# Patient Record
Sex: Male | Born: 1940
Health system: Southern US, Community
[De-identification: ages and names within clinical notes are randomized; demographics above are authoritative.]

## PROBLEM LIST (undated history)

## (undated) DIAGNOSIS — T7840XA Allergy, unspecified, initial encounter: Secondary | ICD-10-CM

## (undated) DIAGNOSIS — M1990 Unspecified osteoarthritis, unspecified site: Secondary | ICD-10-CM

## (undated) DIAGNOSIS — E079 Disorder of thyroid, unspecified: Secondary | ICD-10-CM

## (undated) DIAGNOSIS — I2510 Atherosclerotic heart disease of native coronary artery without angina pectoris: Secondary | ICD-10-CM

## (undated) DIAGNOSIS — M109 Gout, unspecified: Secondary | ICD-10-CM

## (undated) DIAGNOSIS — K219 Gastro-esophageal reflux disease without esophagitis: Secondary | ICD-10-CM

## (undated) DIAGNOSIS — I10 Essential (primary) hypertension: Secondary | ICD-10-CM

## (undated) DIAGNOSIS — N200 Calculus of kidney: Secondary | ICD-10-CM

## (undated) DIAGNOSIS — Z860100 Personal history of colon polyps, unspecified: Secondary | ICD-10-CM

## (undated) DIAGNOSIS — E785 Hyperlipidemia, unspecified: Secondary | ICD-10-CM

## (undated) DIAGNOSIS — R12 Heartburn: Secondary | ICD-10-CM

## (undated) HISTORY — DX: Unspecified osteoarthritis, unspecified site: M19.90

## (undated) HISTORY — PX: BIOPSY PROSTATE: PRO28

## (undated) HISTORY — DX: Gastro-esophageal reflux disease without esophagitis: K21.9

## (undated) HISTORY — DX: Essential (primary) hypertension: I10

## (undated) HISTORY — DX: Hyperlipidemia, unspecified: E78.5

## (undated) HISTORY — DX: Personal history of colonic polyps: Z86.010

## (undated) HISTORY — DX: Personal history of colon polyps, unspecified: Z86.0100

## (undated) HISTORY — PX: OTHER SURGICAL HISTORY: SHX169

## (undated) HISTORY — DX: Allergy, unspecified, initial encounter: T78.40XA

## (undated) HISTORY — PX: UPPER GASTROINTESTINAL ENDOSCOPY: SHX188

## (undated) HISTORY — DX: Atherosclerotic heart disease of native coronary artery without angina pectoris: I25.10

## (undated) HISTORY — DX: Disorder of thyroid, unspecified: E07.9

---

## 1996-09-10 HISTORY — PX: CORONARY ARTERY BYPASS GRAFT: SHX141

## 2000-06-21 ENCOUNTER — Encounter: Payer: Self-pay | Admitting: *Deleted

## 2000-06-21 ENCOUNTER — Inpatient Hospital Stay (HOSPITAL_COMMUNITY): Admission: EM | Admit: 2000-06-21 | Discharge: 2000-06-22 | Payer: Self-pay | Admitting: Emergency Medicine

## 2000-07-05 ENCOUNTER — Inpatient Hospital Stay (HOSPITAL_COMMUNITY): Admission: EM | Admit: 2000-07-05 | Discharge: 2000-07-09 | Payer: Self-pay | Admitting: Emergency Medicine

## 2000-07-05 ENCOUNTER — Encounter: Payer: Self-pay | Admitting: Emergency Medicine

## 2000-08-14 ENCOUNTER — Encounter: Payer: Self-pay | Admitting: Cardiology

## 2000-08-14 ENCOUNTER — Inpatient Hospital Stay (HOSPITAL_COMMUNITY): Admission: EM | Admit: 2000-08-14 | Discharge: 2000-08-15 | Payer: Self-pay | Admitting: Emergency Medicine

## 2000-11-01 ENCOUNTER — Ambulatory Visit (HOSPITAL_COMMUNITY): Admission: RE | Admit: 2000-11-01 | Discharge: 2000-11-01 | Payer: Self-pay | Admitting: Gastroenterology

## 2000-11-01 ENCOUNTER — Encounter (INDEPENDENT_AMBULATORY_CARE_PROVIDER_SITE_OTHER): Payer: Self-pay | Admitting: Specialist

## 2007-09-11 HISTORY — PX: COLONOSCOPY W/ POLYPECTOMY: SHX1380

## 2007-09-17 ENCOUNTER — Ambulatory Visit: Payer: Self-pay | Admitting: Cardiology

## 2008-12-04 DIAGNOSIS — E785 Hyperlipidemia, unspecified: Secondary | ICD-10-CM

## 2008-12-04 DIAGNOSIS — I10 Essential (primary) hypertension: Secondary | ICD-10-CM | POA: Insufficient documentation

## 2008-12-04 DIAGNOSIS — I2510 Atherosclerotic heart disease of native coronary artery without angina pectoris: Secondary | ICD-10-CM

## 2008-12-04 DIAGNOSIS — I495 Sick sinus syndrome: Secondary | ICD-10-CM | POA: Insufficient documentation

## 2008-12-08 ENCOUNTER — Ambulatory Visit: Payer: Self-pay | Admitting: Cardiology

## 2009-11-17 ENCOUNTER — Ambulatory Visit: Payer: Self-pay | Admitting: Internal Medicine

## 2009-11-17 ENCOUNTER — Inpatient Hospital Stay (HOSPITAL_COMMUNITY): Admission: EM | Admit: 2009-11-17 | Discharge: 2009-11-22 | Payer: Self-pay | Admitting: Emergency Medicine

## 2009-12-12 ENCOUNTER — Encounter: Payer: Self-pay | Admitting: Cardiology

## 2009-12-14 ENCOUNTER — Ambulatory Visit: Payer: Self-pay | Admitting: Cardiology

## 2009-12-21 ENCOUNTER — Telehealth: Payer: Self-pay | Admitting: Cardiology

## 2009-12-21 ENCOUNTER — Ambulatory Visit: Payer: Self-pay

## 2009-12-21 ENCOUNTER — Ambulatory Visit: Payer: Self-pay | Admitting: Cardiology

## 2009-12-21 DIAGNOSIS — R002 Palpitations: Secondary | ICD-10-CM | POA: Insufficient documentation

## 2010-02-20 ENCOUNTER — Telehealth: Payer: Self-pay | Admitting: Cardiology

## 2010-02-21 ENCOUNTER — Encounter: Payer: Self-pay | Admitting: Cardiology

## 2010-02-21 ENCOUNTER — Encounter (INDEPENDENT_AMBULATORY_CARE_PROVIDER_SITE_OTHER): Payer: Self-pay | Admitting: *Deleted

## 2010-02-23 ENCOUNTER — Encounter: Payer: Self-pay | Admitting: Cardiology

## 2010-02-23 ENCOUNTER — Ambulatory Visit (HOSPITAL_COMMUNITY)
Admission: RE | Admit: 2010-02-23 | Discharge: 2010-02-23 | Payer: Self-pay | Source: Home / Self Care | Admitting: Urology

## 2010-04-18 ENCOUNTER — Encounter: Payer: Self-pay | Admitting: Cardiology

## 2010-04-19 ENCOUNTER — Ambulatory Visit: Payer: Self-pay | Admitting: Cardiology

## 2010-08-23 ENCOUNTER — Encounter: Payer: Self-pay | Admitting: Cardiology

## 2010-09-27 ENCOUNTER — Ambulatory Visit
Admission: RE | Admit: 2010-09-27 | Discharge: 2010-09-27 | Payer: Self-pay | Source: Home / Self Care | Attending: Cardiology | Admitting: Cardiology

## 2010-09-27 ENCOUNTER — Encounter: Payer: Self-pay | Admitting: Cardiology

## 2010-10-10 NOTE — Progress Notes (Signed)
Summary: clearance for proceduer  waiting on call back  pfh,rn   Phone Note From Other Clinic   Caller: nurse pam Summary of Call: Per Pam Dr Annabell Howells pt needs a kidney stone removed ASAP needs to know if pt is ok to hold plavix and have this done. ofc [id number] [phone] fax [phone] Initial call taken by: Edman Circle,  February 20, 2010 11:16 AM  Follow-up for Phone Call        Pt would be at risk for stent thrombosis if Plavix held secondary to DES in March.  Can procedure be done on Plavix? Follow-up by: Rollene Rotunda, MD, [facility],  February 20, 2010 5:44 PM  Additional Follow-up for Phone Call Additional follow up Details #1::        left message for Pam at Dr Belva Crome office that pt had a DES in March and holding Plavix puts him a risk for stent thrombosis.  Asked her to call back to discuss options.  Pam Fleming-Hayes,RN  Spoke with Pam at Dr Belva Crome office, procedure can be done without holding Plavix. Additional Follow-up by: Charolotte Capuchin, RN,  February 21, 2010 9:36 AM

## 2010-10-10 NOTE — Assessment & Plan Note (Signed)
Summary: [facility]  Medications Added ASPIRIN 325 MG  TABS (ASPIRIN) 1 by mouth daily * BLACK CHERRY 250MG  1 by mouth daily FISH OIL   OIL (FISH OIL) 2 po daily VITAMIN D3 1000 UNIT CAPS (CHOLECALCIFEROL) 1 by mouth daily SYNTHROID 50 MCG TABS (LEVOTHYROXINE SODIUM) 1 by mouth daily METOPROLOL SUCCINATE 50 MG XR24H-TAB (METOPROLOL SUCCINATE) 1 by mouth daily LOVASTATIN 40 MG TABS (LOVASTATIN) 1 by mouth daily AMLODIPINE BESYLATE 10 MG TABS (AMLODIPINE BESYLATE) 1 by mouth daily PLAVIX 75 MG TABS (CLOPIDOGREL BISULFATE) 1 by mouth daily LOSARTAN POTASSIUM 50 MG TABS (LOSARTAN POTASSIUM) 1 by mouth daily NITROSTAT 0.4 MG SUBL (NITROGLYCERIN) as needed      Allergies Added: ! SULFA   Visit Type:  Follow-up Primary Provider:  Dr. Vernon Prey  CC:  CAD.  History of Present Illness: The patient presents for followup after recent stenting. Since that hospitalization he has done well. He has had no other chest discomfort which prompted his admission. He's had no chest pressure, neck or arm discomfort. He's been walking the golf course and walking for exercise. He did have an episode of lightheadedness and dizziness for which he took a nitroglycerin. His blood pressure was elevated at that time. He's had no further episodes of this and says otherwise his blood pressure is well controlled. I reviewed his most recent lipids with an LDL of 73 and HDL of 27. This was done in the hospital.  Current Medications (verified): 1)  Aspirin 325 Mg  Tabs (Aspirin) .Marland Kitchen.. 1 By Mouth Daily 2)  Black Cherry 250mg  .... 1 By Mouth Daily 3)  Fish Oil   Oil (Fish Oil) .... 2 Po Daily 4)  Vitamin D3 1000 Unit Caps (Cholecalciferol) .Marland Kitchen.. 1 By Mouth Daily 5)  Synthroid 50 Mcg Tabs (Levothyroxine Sodium) .Marland Kitchen.. 1 By Mouth Daily 6)  Metoprolol Succinate 50 Mg Xr24h-Tab (Metoprolol Succinate) .Marland Kitchen.. 1 By Mouth Daily 7)  Lovastatin 40 Mg Tabs (Lovastatin) .Marland Kitchen.. 1 By Mouth Daily 8)  Amlodipine Besylate 10 Mg Tabs  (Amlodipine Besylate) .Marland Kitchen.. 1 By Mouth Daily 9)  Plavix 75 Mg Tabs (Clopidogrel Bisulfate) .Marland Kitchen.. 1 By Mouth Daily 10)  Losartan Potassium 50 Mg Tabs (Losartan Potassium) .Marland Kitchen.. 1 By Mouth Daily 11)  Nitrostat 0.4 Mg Subl (Nitroglycerin) .... As Needed  Allergies (verified): 1)  ! Sulfa  Past History:  Past Medical History: 1. Coronary artery disease (1998 LIMA to the LAD, SVG to circumflex.       He did have a PTCA to the right coronary artery with an 80% lesion       stented in 2001.  DES placed to the PDA 11/2009)  2. Dyslipidemia.   3. Hypertension.   4. Gastroesophageal reflux disease.   Past Surgical History: CABG  Social History: Full Time Tobacco Use - No.  Alcohol Use - no Regular Exercise - yes  Review of Systems       As stated in the HPI and negative for all other systems.   Vital Signs:  Patient profile:   70 year old male Height:      69 inches Weight:      196 pounds BMI:     29.05 Pulse rate:   53 / minute Resp:     18 per minute BP sitting:   160 / 88  (right arm)  Vitals Entered By: Marrion Coy, CNA (December 14, 2009 12:05 PM)  Physical Exam  General:  Well developed, well nourished, in no acute distress. Head:  normocephalic and atraumatic Eyes:  PERRLA/EOM intact; conjunctiva and lids normal. Mouth:  Oral mucosa normal. Neck:  Neck supple, no JVD. No masses, thyromegaly or abnormal cervical nodes. Chest Wall:  well-healed sternotomy scar Lungs:  Clear bilaterally to auscultation and percussion. Abdomen:  Bowel sounds positive; abdomen soft and non-tender without masses, organomegaly, or hernias noted. No hepatosplenomegaly. Msk:  Back normal, normal gait. Muscle strength and tone normal. Extremities:  No clubbing or cyanosis. Neurologic:  Alert and oriented x 3. Skin:  Intact without lesions or rashes. Cervical Nodes:  no significant adenopathy Axillary Nodes:  no significant adenopathy Inguinal Nodes:  no significant adenopathy Psych:  Normal  affect.   Detailed Cardiovascular Exam  Neck    Carotids: Carotids full and equal bilaterally without bruits.      Neck Veins: Normal, no JVD.    Heart    Inspection: no deformities or lifts noted.      Palpation: normal PMI with no thrills palpable.      Auscultation: regular rate and rhythm, S1, S2 without murmurs, rubs, gallops, or clicks.    Vascular    Abdominal Aorta: no palpable masses, pulsations, or audible bruits.      Femoral Pulses: normal femoral pulses bilaterally.      Pedal Pulses: normal pedal pulses bilaterally.      Radial Pulses: normal radial pulses bilaterally.      Peripheral Circulation: no clubbing, cyanosis, or edema noted with normal capillary refill.     EKG  Procedure date:  12/14/2009  Findings:      sinus bradycardia, rate 53, axis within normal limits, intervals within normal limits, no acute ST-T wave changes  Impression & Recommendations:  Problem # 1:  CAD (ICD-414.00) The patient has no new symptoms. No further cardiovascular testing is suggested. He will continue with risk reduction.  Problem # 2:  HYPERTENSION (ICD-401.9) His blood pressure has gone up once since he was discharged that he knows about. Otherwise he takes it and says it's well-controlle it slightly elevated today. He can keep a nylon this and knows that the goa is systolic blood pressure less than 140 and diastolic less than 90. He was given instructions on keeping a blood pressure diary. It is not well treated auricle going forward we will make meds as.  Problem # 3:  SINUS BRADYCARDIA (ICD-427.81) He has no symptoms related to this. No change in therapy is indicated. Orders: EKG w/ Interpretation (93000)  Problem # 4:  HYPERLIPIDEMIA (ICD-272.4) He will first try increased walking. Cost is a significant consideration. However, he understands that he cannot increase his HDL we would suggest a change in therapy possibly to include over-the-counter niacin.  Patient  Instructions: 1)  Your physician recommends that you schedule a follow-up appointment in: 4 months in [place] 2)  Your physician recommends that you continue on your current medications as directed. Please refer to the Current Medication list given to you today.

## 2010-10-10 NOTE — Letter (Signed)
Summary: Clearance Letter/ do not use  [facility], Main Office  [address]   [place], [place] [address]   Phone: [phone]  Fax: [phone]    February 21, 2010  Re:     Kevin Flores Address:   [address]     [place], [place]  [address] DOB:     Feb 20, 1941 MRN:     [id number]   Dear            Sincerely,  Charolotte Capuchin, RN

## 2010-10-10 NOTE — Procedures (Signed)
Summary: [facility] Event  [facility]ter and Event   Imported By: Erle Crocker 02/03/2010 09:05:49  _____________________________________________________________________  External Attachment:    Type:   Image     Comment:   External Document

## 2010-10-10 NOTE — Assessment & Plan Note (Signed)
Summary: Kevin Flores  Medications Added CRESTOR 20 MG TABS (ROSUVASTATIN CALCIUM) 1 by mouth daily      Allergies Added:   Visit Type:  Follow-up Primary Provider:  Dr. Vernon Prey  CC:  CAD.  History of Present Illness: The patient presents for followup of his known coronary disease. At the last appointment he was having some palpitations and some atypical chest discomfort after having had a recent stent. However, since that time he has done much better. He has not had any chest discomfort, neck or arm discomfort. He has had no palpitations, presyncope or syncope. He is active in exercising without limitations.  Current Medications (verified): 1)  Aspirin 325 Mg  Tabs (Aspirin) .Marland Kitchen.. 1 By Mouth Daily 2)  Fish Oil   Oil (Fish Oil) .... 2 Po Daily 3)  Vitamin D3 1000 Unit Caps (Cholecalciferol) .Marland Kitchen.. 1 By Mouth Daily 4)  Synthroid 50 Mcg Tabs (Levothyroxine Sodium) .Marland Kitchen.. 1 By Mouth Daily 5)  Metoprolol Succinate 50 Mg Xr24h-Tab (Metoprolol Succinate) .Marland Kitchen.. 1 By Mouth Daily 6)  Amlodipine Besylate 10 Mg Tabs (Amlodipine Besylate) .Marland Kitchen.. 1 By Mouth Daily 7)  Plavix 75 Mg Tabs (Clopidogrel Bisulfate) .Marland Kitchen.. 1 By Mouth Daily 8)  Losartan Potassium 50 Mg Tabs (Losartan Potassium) .Marland Kitchen.. 1 By Mouth Daily 9)  Nitrostat 0.4 Mg Subl (Nitroglycerin) .... As Needed 10)  Crestor 20 Mg Tabs (Rosuvastatin Calcium) .Marland Kitchen.. 1 By Mouth Daily  Allergies (verified): 1)  ! Sulfa  Past History:  Past Medical History: Reviewed history from 12/21/2009 and no changes required.  1. Coronary artery disease (1998 LIMA to the LAD, SVG to circumflex.       He did have a PTCA to the right coronary artery with an 80% lesion       stented in 2001.  DES placed to the PDA 11/2009)  2. Dyslipidemia.   3. Hypertension.   4. Gastroesophageal reflux disease.   Past Surgical History: Reviewed history from 12/14/2009 and no changes required. CABG  Review of Systems       As stated in the HPI and negative for all  other systems.   Vital Signs:  Patient profile:   70 year old male Height:      69 inches Weight:      187 pounds BMI:     27.71 Pulse rate:   45 / minute Resp:     18 per minute BP sitting:   140 / 78  (right arm)  Vitals Entered By: Marrion Coy, CNA (April 19, 2010 11:04 AM)  Physical Exam  General:  Well developed, well nourished, in no acute distress. Head:  normocephalic and atraumatic Eyes:  PERRLA/EOM intact; conjunctiva and lids normal. Mouth:  Oral mucosa normal. Neck:  Neck supple, no JVD. No masses, thyromegaly or abnormal cervical nodes. Chest Wall:  well-healed sternotomy scar Lungs:  Clear bilaterally to auscultation and percussion. Abdomen:  Bowel sounds positive; abdomen soft and non-tender without masses, organomegaly, or hernias noted. No hepatosplenomegaly. Msk:  Back normal, normal gait. Muscle strength and tone normal. Extremities:  No clubbing or cyanosis. Neurologic:  Alert and oriented x 3. Skin:  Intact without lesions or rashes. Cervical Nodes:  no significant adenopathy Inguinal Nodes:  no significant adenopathy Psych:  Normal affect.   Detailed Cardiovascular Exam  Neck    Carotids: Carotids full and equal bilaterally without bruits.      Neck Veins: Normal, no JVD.    Heart    Inspection: no deformities or lifts noted.  Palpation: normal PMI with no thrills palpable.      Auscultation: regular rate and rhythm, S1, S2 without murmurs, rubs, gallops, or clicks.    Vascular    Abdominal Aorta: no palpable masses, pulsations, or audible bruits.      Femoral Pulses: normal femoral pulses bilaterally.      Pedal Pulses: normal pedal pulses bilaterally.      Radial Pulses: normal radial pulses bilaterally.      Peripheral Circulation: no clubbing, cyanosis, or edema noted with normal capillary refill.     EKG  Procedure date:  04/19/2010  Findings:      Sinus bradycardia, rate 45, left axis deviation, no acute ST-T wave  changes  Impression & Recommendations:  Problem # 1:  PALPITATIONS (ICD-785.1) The patient has had no new palpitations. He said no presyncope or syncope. No further cardiac evaluation is suggested. He is bradycardic but has no symptoms related to this. Orders: EKG w/ Interpretation (93000)  Problem # 2:  CAD (ICD-414.00) He has no new symptoms. He will continue with risk reduction. Orders: EKG w/ Interpretation (93000)  Problem # 3:  HYPERLIPIDEMIA (ICD-272.4) I reviewed a recent lipid profile with an LDL in the 50s. His HDL was slightly low. He is being managed by the lipid clinic and his primary practice.  Patient Instructions: 1)  Your physician recommends that you schedule a follow-up appointment in: 1 yr in the [place] office 2)  Your physician recommends that you continue on your current medications as directed. Please refer to the Current Medication list given to you today.

## 2010-10-10 NOTE — Miscellaneous (Signed)
  Clinical Lists Changes  Observations: Added new observation of EVENTFIND: Occ PVC's, Sinus brady (12/21/2009 10:16)      Event Monitor  Procedure date:  12/21/2009  Findings:      Occ PVC's, Sinus brady

## 2010-10-10 NOTE — Miscellaneous (Signed)
  Clinical Lists Changes  Observations: Added new observation of CARDCATHFIND: FINAL CONCLUSIONS:  Successful stenting of the right PDA with a single drug-eluting stent.   RECOMMENDATIONS:  The patient should receive 1 year of dual antiplatelet therapy with aspirin and Plavix.  If he has recurrent ischemia, a staged PCI of the obtuse marginal bifurcation could be considered.   (11/21/2009 12:06) Added new observation of CARDCATHFIND:  This is a 70 year old who presented with unstable angina. The saphenous vein graft to the second obtuse marginal and LIMA to the LAD are patent.  There is a 99% stenosis of the proximal PDA with TIMI II flow down the PDA.  The vessel is moderate in size.  After the touchdown of the saphenous vein graft to the second obtuse marginal, there is an 80-90% stenosis at the bifurcation site in the moderate-to-large second obtuse marginal.  We will plan on starting Plavix, hydrating the patient at the weekend, and we will discuss PCI to the PDA and the obtuse marginal on Monday.   (11/19/2009 12:06)      Cardiac Cath  Procedure date:  11/21/2009  Findings:      FINAL CONCLUSIONS:  Successful stenting of the right PDA with a single drug-eluting stent.   RECOMMENDATIONS:  The patient should receive 1 year of dual antiplatelet therapy with aspirin and Plavix.  If he has recurrent ischemia, a staged PCI of the obtuse marginal bifurcation could be considered.    Cardiac Cath  Procedure date:  11/19/2009  Findings:       This is a 70 year old who presented with unstable angina. The saphenous vein graft to the second obtuse marginal and LIMA to the LAD are patent.  There is a 99% stenosis of the proximal PDA with TIMI II flow down the PDA.  The vessel is moderate in size.  After the touchdown of the saphenous vein graft to the second obtuse marginal, there is an 80-90% stenosis at the bifurcation site in the moderate-to-large second obtuse marginal.   We will plan on starting Plavix, hydrating the patient at the weekend, and we will discuss PCI to the PDA and the obtuse marginal on Monday.

## 2010-10-10 NOTE — Letter (Signed)
Summary: [facility]  [facility]   Imported By: Debby Freiberg 03/08/2010 14:47:39  _____________________________________________________________________  External Attachment:    Type:   Image     Comment:   External Document

## 2010-10-10 NOTE — Letter (Signed)
Summary: Clearance Letter  [facility], Main Office  [address]   [place], [place] [address]   Phone: [phone]  Fax: [phone]        February 21, 2010    Re:     Kevin Flores Address:   [address]     [place], [place]  [address] DOB:     February 18, 1941 MRN:     [id number]     Dear Dr. Annabell Howells,  The above named patient will need to stay on Plavix until at least March 2012 due to the placement of a drug eluting stent.  Holding or stopping Plavix during this time period puts him at risk for in-stent thrombosis.  If you have further questions or needs please contact us.         Sincerely,      Charolotte Capuchin, RN for Dr. Rollene Rotunda

## 2010-10-10 NOTE — Assessment & Plan Note (Signed)
Summary: irregular HB/weakness  PFH,RN      Allergies Added:   Visit Type:  Follow-up Primary Provider:  Dr. Vernon Prey  CC:  Palpitations and chest pain.  History of Present Illness: The patient presents for evaluation of symptoms that started on Monday. He called to be added to the schedule. On Monday night while trying to go to bed he felt some fluttering in his chest. He felt dizzy. He actually got up and took a nitroglycerin. Symptoms lasted for 15-20 minutes. He did not have any other tightness that he had at the time of his last intervention. He did have a tingling and a warm sensation. He again had this this morning taking again and nitroglycerin. He fell little dizzy. He felt a little flushed in his face. These may be been a little more short of breath with activity but this is subtle. He's had no PND or orthopnea. He said no neck or arm discomfort. He does think that this is different than her presenting complaints in March at which time he had PCI.  Current Medications (verified): 1)  Aspirin 325 Mg  Tabs (Aspirin) .Marland Kitchen.. 1 By Mouth Daily 2)  Black Cherry 250mg  .... 1 By Mouth Daily 3)  Fish Oil   Oil (Fish Oil) .... 2 Po Daily 4)  Vitamin D3 1000 Unit Caps (Cholecalciferol) .Marland Kitchen.. 1 By Mouth Daily 5)  Synthroid 50 Mcg Tabs (Levothyroxine Sodium) .Marland Kitchen.. 1 By Mouth Daily 6)  Metoprolol Succinate 50 Mg Xr24h-Tab (Metoprolol Succinate) .Marland Kitchen.. 1 By Mouth Daily 7)  Lovastatin 40 Mg Tabs (Lovastatin) .Marland Kitchen.. 1 By Mouth Daily 8)  Amlodipine Besylate 10 Mg Tabs (Amlodipine Besylate) .Marland Kitchen.. 1 By Mouth Daily 9)  Plavix 75 Mg Tabs (Clopidogrel Bisulfate) .Marland Kitchen.. 1 By Mouth Daily 10)  Losartan Potassium 50 Mg Tabs (Losartan Potassium) .Marland Kitchen.. 1 By Mouth Daily 11)  Nitrostat 0.4 Mg Subl (Nitroglycerin) .... As Needed  Allergies (verified): 1)  ! Sulfa  Past History:  Past Medical History:  1. Coronary artery disease (1998 LIMA to the LAD, SVG to circumflex.       He did have a PTCA to the right coronary  artery with an 80% lesion       stented in 2001.  DES placed to the PDA 11/2009)  2. Dyslipidemia.   3. Hypertension.   4. Gastroesophageal reflux disease.   Past Surgical History: Reviewed history from 12/14/2009 and no changes required. CABG  Review of Systems       As stated in the HPI and negative for all other systems.   Vital Signs:  Patient profile:   70 year old male Height:      69 inches Weight:      190 pounds BMI:     28.16 Pulse rate:   60 / minute Resp:     16 per minute BP sitting:   162 / 96  (right arm)  Vitals Entered By: Marrion Coy, CNA (December 21, 2009 1:44 PM)  Physical Exam  General:  Well developed, well nourished, in no acute distress. Head:  normocephalic and atraumatic Eyes:  PERRLA/EOM intact; conjunctiva and lids normal. Mouth:  Oral mucosa normal. Neck:  Neck supple, no JVD. No masses, thyromegaly or abnormal cervical nodes. Chest Wall:  well-healed sternotomy scar Lungs:  Clear bilaterally to auscultation and percussion. Abdomen:  Bowel sounds positive; abdomen soft and non-tender without masses, organomegaly, or hernias noted. No hepatosplenomegaly. Msk:  Back normal, normal gait. Muscle strength and tone normal. Pulses:  normal  pedal pulses bilaterally.   Extremities:  No clubbing or cyanosis. Neurologic:  Alert and oriented x 3. Skin:  Intact without lesions or rashes. Psych:  Normal affect.   EKG  Procedure date:  12/21/2009  Findings:      sinus rhythm, rate 60, axis within normal limits, intervals within normal limits, no acute ST-T wave changes  Impression & Recommendations:  Problem # 1:  PALPITATIONS (ICD-785.1) The patient will wear an event monitor. Orders: Event (Event)  Problem # 2:  CAD (ICD-414.00) I am not convinced that any of his symptoms are related to his coronary disease. I tried to look at his films in the office but the system was down. I have a phone call into Dr. Excell Seltzer and we'll review these with him  as he is in the hospital and did his intervention. We might need to consider further intervention though again I cannot clearly associate any of the recent symptoms with coronary disease. Orders: EKG w/ Interpretation (93000) Event (Event)  Problem # 3:  HYPERTENSION (ICD-401.9) His blood pressure has been controlled and he will continue the meds as listed.  Patient Instructions: 1)  Your physician recommends that you continue on your current medications as directed. Please refer to the Current Medication list given to you today. 2)  Your physician has recommended that you wear an event monitor.  Event monitors are medical devices that record the heart's electrical activity. Doctors most often use these monitors to diagnose arrhythmias. Arrhythmias are problems with the speed or rhythm of the heartbeat. The monitor is a small, portable device. You can wear one while you do your normal daily activities. This is usually used to diagnose what is causing palpitations/syncope (passing out).

## 2010-10-10 NOTE — Progress Notes (Signed)
Summary: c/o flutter,dizziness   Phone Note Call from Patient Call back at Home Phone [phone]   Caller: Patient Reason for Call: Talk to Nurse Summary of Call: stent placement 3/14. c/o flutter , dizziness, come/go. Initial call taken by: Lorne Skeens,  December 21, 2009 8:22 AM  Follow-up for Phone Call        Spoke with [name] this morning about his episodes of dizziness,lightheadedness, and "flutter".  Pt says that he is fine today and going to work but has been concerned that after the stent placed on 3/14 he has started having these "episodes".  The last one being on Tuesday where he says " feels like indigestion and tingling"  with lightheadedness.  At the time he was sitting on the couch watching tv.  BP was 154/87 He rechecked BP 130/73 both of which left arm.  Pt has asked does he need a monitor to capture this?  He said he has been to the office a couple of times and as far as he knows his ekg was normal.  I have told him we will let Dr. Antoine Poche know of his concerns.   He says it is fine to call back and talk with his wife [name].  He is feeling fine and going to work today. Follow-up by: Lisabeth Devoid RN,  December 21, 2009 9:21 AM  Additional Follow-up for Phone Call Additional follow up Details #1::        pt called back and has had another "episode"  states it lasted about 10 to 15 mins.  He used SL Ntg and rest and got relief.  Not feels weak and has some nausea.  Feels like BB is irregular, no pain but does have a tingling burning sensation in chest without radiation when it occurs.  He has some SOB when this occurs as well.  No s/s at this time.  Pt given an appointment with Dr Antoine Poche today at 3:30pm to be worked in.  He is aware to go to ED if s/s reoccur prior to office visit today. Sander Nephew, RN

## 2010-10-12 NOTE — Assessment & Plan Note (Signed)
Summary: Kevin Flores  Medications Added NITROSTAT 0.4 MG SUBL (NITROGLYCERIN) as needed HYDROCHLOROTHIAZIDE 25 MG TABS (HYDROCHLOROTHIAZIDE) 1/2 tablet once a day      Allergies Added:   Visit Type:  Follow-up Primary Provider:  Dr. Vernon Prey  CC:  CAD and HTN.  History of Present Illness: The patient Xanax for followup of his known coronary disease. Since I last saw him he has had no new cardiovascular complaints. He is exercising fairly routinely. With this level of exercise he denies any chest pressure, neck or arm discomfort. He has had no palpitations, presyncope or syncope. He has had no PND or orthopnea. He has had no weight gain or edema. He does report that his blood pressures have been elevated consistently above 140 systolic the diastolics are well-controlled. Of note I did review recent lipids which were excellent.  Current Medications (verified): 1)  Aspirin 325 Mg  Tabs (Aspirin) .Marland Kitchen.. 1 By Mouth Daily 2)  Fish Oil   Oil (Fish Oil) .... 2 Po Daily 3)  Vitamin D3 1000 Unit Caps (Cholecalciferol) .Marland Kitchen.. 1 By Mouth Daily 4)  Synthroid 50 Mcg Tabs (Levothyroxine Sodium) .Marland Kitchen.. 1 By Mouth Daily 5)  Metoprolol Succinate 50 Mg Xr24h-Tab (Metoprolol Succinate) .Marland Kitchen.. 1 By Mouth Daily 6)  Amlodipine Besylate 10 Mg Tabs (Amlodipine Besylate) .Marland Kitchen.. 1 By Mouth Daily 7)  Plavix 75 Mg Tabs (Clopidogrel Bisulfate) .Marland Kitchen.. 1 By Mouth Daily 8)  Losartan Potassium 50 Mg Tabs (Losartan Potassium) .Marland Kitchen.. 1 By Mouth Daily 9)  Nitrostat 0.4 Mg Subl (Nitroglycerin) .... As Needed 10)  Crestor 20 Mg Tabs (Rosuvastatin Calcium) .Marland Kitchen.. 1 By Mouth Daily  Allergies (verified): 1)  ! Sulfa  Past History:  Past Medical History: Reviewed history from 12/21/2009 and no changes required.  1. Coronary artery disease (1998 LIMA to the LAD, SVG to circumflex.       He did have a PTCA to the right coronary artery with an 80% lesion       stented in 2001.  DES placed to the PDA 11/2009)  2. Dyslipidemia.   3. Hypertension.   4. Gastroesophageal reflux disease.   Past Surgical History: Reviewed history from 12/14/2009 and no changes required. CABG  Review of Systems       As stated in the HPI and negative for all other systems.   Vital Signs:  Patient profile:   70 year old male Height:      69 inches Weight:      190 pounds BMI:     28.16 Pulse rate:   57 / minute Resp:     16 per minute BP sitting:   124 / 80  (right arm)  Vitals Entered By: Marrion Coy, CNA (September 27, 2010 2:36 PM)  Physical Exam  General:  Well developed, well nourished, in no acute distress. Head:  normocephalic and atraumatic Eyes:  PERRLA/EOM intact; conjunctiva and lids normal. Mouth:  Oral mucosa normal.  Dentures Neck:  Neck supple, no JVD. No masses, thyromegaly or abnormal cervical nodes. Chest Wall:  well-healed sternotomy scar Lungs:  Clear bilaterally to auscultation and percussion. Abdomen:  Bowel sounds positive; abdomen soft and non-tender without masses, organomegaly, or hernias noted. No hepatosplenomegaly. Msk:  Back normal, normal gait. Muscle strength and tone normal. Extremities:  No clubbing or cyanosis. Neurologic:  Alert and oriented x 3. Skin:  Intact without lesions or rashes. Cervical Nodes:  no significant adenopathy Inguinal Nodes:  no significant adenopathy Psych:  Normal affect.   Detailed Cardiovascular Exam  Neck    Carotids: Carotids full and equal bilaterally without bruits.      Neck Veins: Normal, no JVD.    Heart    Inspection: no deformities or lifts noted.      Palpation: normal PMI with no thrills palpable.      Auscultation: regular rate and rhythm, S1, S2 without murmurs, rubs, gallops, or clicks.    Vascular    Abdominal Aorta: no palpable masses, pulsations, or audible bruits.      Femoral Pulses: normal femoral pulses bilaterally.      Pedal Pulses: normal pedal pulses bilaterally.      Radial Pulses: normal radial pulses bilaterally.       Peripheral Circulation: no clubbing, cyanosis, or edema noted with normal capillary refill.     EKG  Procedure date:  09/27/2010  Findings:      sinus bradycardia, rate to, axis within normal limits, intervals within normal limits, no acute ST-T wave changes.  Impression & Recommendations:  Problem # 1:  CAD (ICD-414.00)  No further testing is indicated at this point. We will continue with aggressive risk reduction. Orders: EKG w/ Interpretation (93000)  His updated medication list for this problem includes:    Aspirin 325 Mg Tabs (Aspirin) .Marland Kitchen... 1 by mouth daily    Metoprolol Succinate 50 Mg Xr24h-tab (Metoprolol succinate) .Marland Kitchen... 1 by mouth daily    Amlodipine Besylate 10 Mg Tabs (Amlodipine besylate) .Marland Kitchen... 1 by mouth daily    Plavix 75 Mg Tabs (Clopidogrel bisulfate) .Marland Kitchen... 1 by mouth daily    Nitrostat 0.4 Mg Subl (Nitroglycerin) .Marland Kitchen... As needed  Problem # 2:  HYPERTENSION (ICD-401.9) His blood pressure is not well controlled. I will add hydrochlorothiazide 12.5 mg daily and he will continue his home readings.  Problem # 3:  HYPERLIPIDEMIA (ICD-272.4) Hhas excellent lipid management and will continue Crestor.  Patient Instructions: 1)  Your physician recommends that you schedule a follow-up appointment in: 6 months with Dr Antoine Poche 2)  Your physician has recommended you make the following change in your medication: start Hydrochlorathiazide 12.5 mg once a day Prescriptions: HYDROCHLOROTHIAZIDE 25 MG TABS (HYDROCHLOROTHIAZIDE) 1/2 tablet once a day  #30 x 6   Entered by:   Charolotte Capuchin, RN   Authorized by:   Rollene Rotunda, MD, [facility]   Signed by:   Charolotte Capuchin, RN on 09/27/2010   Method used:   Electronically to        [facility] [phone]* (retail)       [address]       [place], [place]  [address]       Ph: [phone] or [phone]       Fax: [phone]   RxID:   [id number] NITROSTAT 0.4 MG SUBL  (NITROGLYCERIN) as needed  #25 x 10   Entered by:   Marrion Coy, CNA   Authorized by:   Rollene Rotunda, MD, [facility]   Signed by:   Marrion Coy, CNA on 09/27/2010   Method used:   [facility]ically to        [facility] [phone][facility] (retail)       [address]       [place], [place]  [address]       Ph: [phone] or [phone]       Fax: [phone]   RxID:   [id number]  I have reviewed and approved  all prescriptions at the time of this visit. Rollene Rotunda, MD, [facility]  September 27, 2010 3:41 PM

## 2010-10-12 NOTE — Letter (Signed)
Summary: [facility]   [facility]   Imported By: Lynford Humphrey [place] 09/21/2010 14:01:50  _____________________________________________________________________  External Attachment:    Type:   Image     Comment:   External Document

## 2010-11-27 LAB — CBC
Hemoglobin: 14.2 g/dL (ref 13.0–17.0)
MCV: 91.4 fL (ref 78.0–100.0)
RBC: 4.71 MIL/uL (ref 4.22–5.81)
WBC: 12.7 10*3/uL — ABNORMAL HIGH (ref 4.0–10.5)

## 2010-11-27 LAB — COMPREHENSIVE METABOLIC PANEL
ALT: 105 U/L — ABNORMAL HIGH (ref 0–53)
AST: 56 U/L — ABNORMAL HIGH (ref 0–37)
CO2: 27 mEq/L (ref 19–32)
Chloride: 105 mEq/L (ref 96–112)
Creatinine, Ser: 1.99 mg/dL — ABNORMAL HIGH (ref 0.4–1.5)
GFR calc Af Amer: 41 mL/min — ABNORMAL LOW (ref >60)
GFR calc non Af Amer: 34 mL/min — ABNORMAL LOW (ref >60)
Glucose, Bld: 89 mg/dL (ref 70–99)
Sodium: 139 mEq/L (ref 135–145)
Total Bilirubin: 0.9 mg/dL (ref 0.3–1.2)

## 2010-11-27 LAB — SURGICAL PCR SCREEN
MRSA, PCR: NEGATIVE
Staphylococcus aureus: NEGATIVE

## 2010-11-27 LAB — PROTIME-INR: Prothrombin Time: 13.5 seconds (ref 11.6–15.2)

## 2010-12-01 LAB — CBC
HCT: 39.5 % (ref 39.0–52.0)
HCT: 42 % (ref 39.0–52.0)
HCT: 43.8 % (ref 39.0–52.0)
Hemoglobin: 13.5 g/dL (ref 13.0–17.0)
Hemoglobin: 14.4 g/dL (ref 13.0–17.0)
Hemoglobin: 14.8 g/dL (ref 13.0–17.0)
MCHC: 34.1 g/dL (ref 30.0–36.0)
MCHC: 34.2 g/dL (ref 30.0–36.0)
MCHC: 34.3 g/dL (ref 30.0–36.0)
MCV: 91.3 fL (ref 78.0–100.0)
MCV: 91.7 fL (ref 78.0–100.0)
MCV: 92.1 fL (ref 78.0–100.0)
Platelets: 134 10*3/uL — ABNORMAL LOW (ref 150–400)
Platelets: 139 K/uL — ABNORMAL LOW (ref 150–400)
Platelets: 140 K/uL — ABNORMAL LOW (ref 150–400)
Platelets: 156 10*3/uL (ref 150–400)
RBC: 4.3 MIL/uL (ref 4.22–5.81)
RBC: 4.59 MIL/uL (ref 4.22–5.81)
RDW: 13.2 % (ref 11.5–15.5)
RDW: 13.3 % (ref 11.5–15.5)
RDW: 13.3 % (ref 11.5–15.5)
RDW: 13.5 % (ref 11.5–15.5)
WBC: 10.4 K/uL (ref 4.0–10.5)
WBC: 6.9 K/uL (ref 4.0–10.5)
WBC: 9 10*3/uL (ref 4.0–10.5)

## 2010-12-01 LAB — BASIC METABOLIC PANEL WITH GFR
BUN: 15 mg/dL (ref 6–23)
BUN: 15 mg/dL (ref 6–23)
CO2: 26 meq/L (ref 19–32)
CO2: 26 meq/L (ref 19–32)
Calcium: 8.9 mg/dL (ref 8.4–10.5)
Calcium: 8.9 mg/dL (ref 8.4–10.5)
Chloride: 105 meq/L (ref 96–112)
Chloride: 106 meq/L (ref 96–112)
Creatinine, Ser: 1.3 mg/dL (ref 0.4–1.5)
Creatinine, Ser: 1.63 mg/dL — ABNORMAL HIGH (ref 0.4–1.5)
GFR calc Af Amer: 51 mL/min — ABNORMAL LOW (ref >60)
GFR calc Af Amer: >60 mL/min (ref >60)
GFR calc non Af Amer: 43 mL/min — ABNORMAL LOW (ref >60)
GFR calc non Af Amer: 55 mL/min — ABNORMAL LOW (ref >60)
Glucose, Bld: 107 mg/dL — ABNORMAL HIGH (ref 70–99)
Glucose, Bld: 97 mg/dL (ref 70–99)
Potassium: 4 meq/L (ref 3.5–5.1)
Potassium: 4.6 meq/L (ref 3.5–5.1)
Sodium: 137 meq/L (ref 135–145)
Sodium: 141 meq/L (ref 135–145)

## 2010-12-01 LAB — BASIC METABOLIC PANEL
BUN: 17 mg/dL (ref 6–23)
BUN: 17 mg/dL (ref 6–23)
BUN: 18 mg/dL (ref 6–23)
CO2: 23 mEq/L (ref 19–32)
CO2: 26 mEq/L (ref 19–32)
CO2: 28 mEq/L (ref 19–32)
Calcium: 9.2 mg/dL (ref 8.4–10.5)
Chloride: 106 mEq/L (ref 96–112)
Chloride: 106 mEq/L (ref 96–112)
Chloride: 108 mEq/L (ref 96–112)
Creatinine, Ser: 1.49 mg/dL (ref 0.4–1.5)
Creatinine, Ser: 1.55 mg/dL — ABNORMAL HIGH (ref 0.4–1.5)
GFR calc non Af Amer: 45 mL/min — ABNORMAL LOW (ref >60)
GFR calc non Af Amer: 45 mL/min — ABNORMAL LOW (ref >60)
Glucose, Bld: 105 mg/dL — ABNORMAL HIGH (ref 70–99)
Glucose, Bld: 108 mg/dL — ABNORMAL HIGH (ref 70–99)
Glucose, Bld: 152 mg/dL — ABNORMAL HIGH (ref 70–99)
Potassium: 3.5 mEq/L (ref 3.5–5.1)
Potassium: 3.9 mEq/L (ref 3.5–5.1)
Sodium: 137 mEq/L (ref 135–145)

## 2010-12-01 LAB — HEPATIC FUNCTION PANEL: Total Protein: 6.5 g/dL (ref 6.0–8.3)

## 2010-12-01 LAB — POCT I-STAT, CHEM 8
BUN: 18 mg/dL (ref 6–23)
Calcium, Ion: 1.06 mmol/L — ABNORMAL LOW (ref 1.12–1.32)
Chloride: 110 meq/L (ref 96–112)
Creatinine, Ser: 1.3 mg/dL (ref 0.4–1.5)
Glucose, Bld: 96 mg/dL (ref 70–99)
HCT: 43 % (ref 39.0–52.0)
Hemoglobin: 14.6 g/dL (ref 13.0–17.0)
Potassium: 4.7 meq/L (ref 3.5–5.1)
Sodium: 139 meq/L (ref 135–145)
TCO2: 27 mmol/L (ref 0–100)

## 2010-12-01 LAB — HEPARIN LEVEL (UNFRACTIONATED): Heparin Unfractionated: 0.22 IU/mL — ABNORMAL LOW (ref 0.30–0.70)

## 2010-12-01 LAB — POCT CARDIAC MARKERS
CKMB, poc: 2.8 ng/mL (ref 1.0–8.0)
Myoglobin, poc: 130 ng/mL (ref 12–200)

## 2010-12-01 LAB — LIPID PANEL
Cholesterol: 127 mg/dL (ref 0–200)
LDL Cholesterol: 73 mg/dL (ref 0–99)
Triglycerides: 137 mg/dL (ref <150)

## 2010-12-01 LAB — PROTIME-INR
INR: 0.94 (ref 0.00–1.49)
INR: 1.06 (ref 0.00–1.49)
Prothrombin Time: 13.7 s (ref 11.6–15.2)

## 2011-01-23 NOTE — Assessment & Plan Note (Signed)
[facility]                            CARDIOLOGY OFFICE NOTE   NAME:Kevin Flores, Kevin Flores                         MRN:          [id number]  DATE:12/08/2008                            DOB:          1941/05/27    PRIMARY CARE PHYSICIAN:  Lindaann Pascal, PA, [facility]   REASON FOR PRESENTATION:  Evaluate the patient with coronary artery  disease.   HISTORY OF PRESENT ILLNESS:  The patient presents for followup of the  above.  It has been a little over a year since I last saw him.  In the  past year, from a cardiovascular standpoint, he has done well.  He has  continued working and some of this work is quite vigorous.  With this,  he denies any chest discomfort, neck or arm discomfort.  He has no  palpitations, presyncope, or syncope.  He has no PND or orthopnea.  He  has no significant dyspnea with exertion.   He has been diagnosed with a prostate nodule and is due to have a  biopsy.  He is seeing Dr. Annabell Howells.  Of note, he says that his insurance  would no longer covered Diovan and he requests a different medication.   PAST MEDICAL HISTORY:  1. Coronary artery disease (1998, LIMA to the LAD, SVG to circumflex.      He had PTCA of the right coronary artery with an 80% lesion stented      in 2001).  2. Mildly reduced ejection fraction (50%).  3. Dyslipidemia.  4. Hypertension.  5. Gastroesophageal reflux disease.  6. Prostate nodule.   ALLERGIES:  SULFA.   MEDICATIONS:  1. Aspirin 325 mg daily.  2. Metoprolol 50 mg daily.  3. Diovan 320 mg daily.  4. Norvasc 2.5 mg daily.  5. Lovastatin 20 mg daily.  6. Vitamin D.   REVIEW OF SYSTEMS:  As stated in the HPI and otherwise negative for all  other systems.   PHYSICAL EXAMINATION:  GENERAL:  The patient is pleasant and in no  distress.  VITAL SIGNS:  Blood pressure 150/80, heart rate 61 and regular, weight  193 pounds, and body mass index 29.  HEENT:  Eyelids unremarkable.   Pupils equal, round, and react to light.  Fundi not visualized.  Oral mucosa unremarkable.  NECK:  No jugular venous distention at 45 degrees.  Carotid upstroke  brisk and symmetric.  No bruits.  No thyromegaly.  LYMPHATICS:  No cervical, axillary, or inguinal adenopathy.  LUNGS:  Clear to auscultation bilaterally.  BACK:  No costovertebral angle tenderness.  CHEST:  Well-healed sternotomy scar.  HEART:  PMI not displaced or sustained.  S1 and S2 within normal limits.  No S3, no S4.  No clicks, no rubs, no murmurs.  ABDOMEN:  Flat, positive bowel sounds, normal in frequency and pitch.  No bruits, no rebound, no guarding.  No midline pulsatile mass.  No  hepatomegaly.  No splenomegaly.  SKIN:  No rashes, no nodules.  EXTREMITIES:  Pulses 2+ throughout.  No edema, no cyanosis, no clubbing.  NEUROLOGIC:  Oriented to  person, place, and time.  Cranial nerves II-XII  grossly intact.  Motor grossly intact throughout.   EKG; sinus rhythm, rate 61, axis within normal limits, intervals within  normal limits.  No acute ST-T wave changes.   ASSESSMENT AND PLAN:  1. Coronary artery disease.  The patient is having no new symptoms.      It has been 5 years since his last stress perfusion study.  He is      not having any symptoms.  However, he does have 68 year old bypass      grafts.  If he needs to have any surgical procedures for treatment      of his prostate, he would need preoperative screening with a stress      test.  Otherwise, I will defer and continue with primary risk      reduction.  2. Dyslipidemia.  I reviewed his lipid profile and he has an      acceptable profile on the current regimen.  3. Hypertension.  Blood pressure is slightly elevated today.  He says      it is very well controlled at home and he takes it occasionally.  I      have to switch his Diovan because of cost.  I am going to put him      on Benicar which is usually covered.  I will put him on 40 mg, the       maximum dose.  If he has need of better blood pressure control, he      can have his Norvasc increased or perhaps try a different ARB if we      can figure out what his new insurance will cover.  4. Followup.  I would see him back yearly, but would need to see him      back sooner if he is to have prostate surgery.     Rollene Rotunda, MD, [facility]  Electronically Signed    JH/MedQ  DD: 12/08/2008  DT: 12/09/2008  Job #: [id number]   cc:   Lindaann Pascal, PA  [facility]. Annabell Howells, M.D.

## 2011-01-23 NOTE — Assessment & Plan Note (Signed)
[facility]                            CARDIOLOGY OFFICE NOTE   NAME:JOYCEGladstone, Rosas                         MRN:          [id number]  DATE:09/17/2007                            DOB:          05-05-1941    REFERRING PHYSICIAN:  Dr. Montey Hora, [facility].   REASON FOR PRESENTATION TODAY:  Patient with coronary disease.   HISTORY OF PRESENT ILLNESS:  The patient is now 70 years old.  It has  been 3-1/2 years since his last visit.  He has coronary disease as  described below.  He has done well since I last saw him.  He says he is  active.  He does work but does not exercise routinely.  He does  occasionally walk on a treadmill.  He likes to golf and walks with this.  He denies any chest pressure, neck discomfort, arm discomfort, activity  induced nausea and vomiting, excessive diaphoresis.  He has had no  palpitation, no presyncope or syncope.  He has not been having any PND  or orthopnea.   PAST MEDICAL HISTORY:  1. Coronary artery disease (1998 LIMA to the LAD, SVG to circumflex.      He did have a PTCA to the right coronary artery with an 80% lesion      stented in 2001), mildly reduced ejection fraction (50%).  2. Dyslipidemia.  3. Hypertension.  4. Gastroesophageal reflux disease.   ALLERGIES:  SULFA.   MEDICATIONS:  Aspirin 325 mg daily, Metoprolol 50 mg daily, Diovan 320  mg daily, Norvasc 2.5 mg daily, Lovastatin 20 mg daily.   REVIEW OF SYSTEMS:  As stated in the HPI and otherwise negative for  other systems.   PHYSICAL EXAMINATION:  GENERAL:  The patient is in no distress.  VITAL SIGNS:  Blood pressure 132/88, heart rate 53 and regular.  Weight  199 pounds, body mass index 29.  HEENT:  Eyelids unremarkable, pupils are equal, round and reactive to  light, fundi not visualized.  Oral mucosa unremarkable.  NECK:  No jugular venous distension, wave form within normal limits,  carotid upstroke brisk and symmetric, no  bruits, no thyromegaly.  LYMPHATICS:  No cervical, axillary, inguinal adenopathy.  LUNGS:  Clear to auscultation bilaterally.  BACK:  No costovertebral angle tenderness.  CHEST:  Unremarkable.  HEART:  PMI not displaced or sustained, S1, S2 within normal limits. No  S3, no S4, no murmurs.  ABDOMEN:  Flat, positive bowel sounds normal in frequency, pitch, no  bruits, rebound, guarding, no midline pulsatile mass, no hepatomegaly,  splenomegaly.  SKIN:  No rashes, no nodules.  EXTREMITIES:  Two+ pulses, no edema.  NEURO:  Oriented to person, place and time, cranial nerves II-XII  grossly intact, motor grossly intact.   EKG:  Sinus bradycardia, rate 53, axis within normal limits, intervals  within normal limits, no acute ST, T wave changes.   ASSESSMENT AND PLAN:  1. Coronary disease.  The patient is doing well with respect to this.      No further cardiovascular testing is suggested at this point.  I  think that since it will have been 5 years since his last perfusion      study in 2010, I probably would do study then but he does not need      one now.  2. Risk reduction.  I do not have his lipid profile but he says he      does have it followed routinely.  I suggest an LDL less than 100      and HDL greater than 40.  3. Hypertension.  Blood pressure is well controlled and he will      continue the medications as listed.  4. Weight. We did discuss his body mass index of 29 and weight of 199      pounds and need to lose this.  He should do this with diet and      exercise. I discussed a walking regimen with him.  5. Followup.  I should see him back in 1 year or sooner if needed.     Rollene Rotunda, MD, [facility]  Electronically Signed    JH/MedQ  DD: 09/17/2007  DT: 09/17/2007  Job #: [id number]   cc:   Montey Hora, PA

## 2011-01-26 NOTE — Discharge Summary (Signed)
[place]. [facility]  Patient:    Kevin Flores, Kevin Flores                       MRN: [id number] Adm. Date:  [id number] Disc. Date: [id number] Attending:  Ronaldo Miyamoto Dictator:   Annett Fabian, P.A. CC:         Monica Becton, M.D., [phone] [address], [place], [place]  [address]  Arturo Morton. Riley Kill, M.D. [facility]   Referring Physician Discharge [facility]  DISCHARGE DIAGNOSES 1. Coronary artery disease. 2. Hypertension. 3. Hyperlipidemia. 4. Gastroesophageal reflux disease. 5. Anxiety.  HOSPITAL COURSE:  Patient presented to the emergency room complaining of some substernal chest pain and nausea which began at work, but no shortness of breath or diaphoresis.  Patient stated it was relieved somewhat by one nitroglycerin.  Patient was seen and admitted by Dr. Dietrich Pates.  Admitting diagnosis was rule out MI.  Because of the patients history of multiple interventions, patient was scheduled for catheterization the following day.  On August 15, 2000, patient was taken to the catheterization lab by Dr. Arturo Morton. [place].  Catheterization revealed in the left main, a 50% lesion; LAD 80%; RCA, there was no in-stent restenosis; and in the circumflex, a 40% stenosis; there was also 80% in the ostial.  Dr. Riley Kill felt this represented no significant change since prior catheterizations.  Patient was discharged home in stable condition.  DISCHARGE MEDICATIONS 1. Enteric-coated aspirin 325 mg q.d. 2. Toprol XL as previously ordered. 3. Diovan 80 mg q.d. 4. Lipitor 10 mg q.d. 5. Buspar 7.5 mg b.i.d. 6. Protonix as previously taken. 7. Nitroglycerin 0.4 mg sublingual q.3min. x 3 p.r.n. chest pain.  DISCHARGE INSTRUCTIONS 1. Patient was advised to avoid heavy lifting, tub baths, driving or sexual    activity for 48 hours. 2. Patient is to follow a low-salt and low-fat diet. 3. Patient was advised to call the office for any pain, swelling, bleeding or    discharge in the  catheterization site. 4. Patient is to contact his primary physician, Dr. Christell Constant, within one week for    followup visit. 5. Patient is to contact Dr. Louretta Shorten office on December 7th for a followup    appointment in two to three weeks.  LABORATORY VALUES:  Sodium 138, potassium 3.8, chloride 107, CO2 27, BUN 14, creatinine 1.1.  Cardiac enzymes were negative for MI.  White count 8.4, hemoglobin 14, hematocrit 40.2, platelets 209,000.  VITALS:  Heart rate of 61, blood pressure 109/68, respiratory rate of 18. DD:  08/15/00 TD:  08/16/00 Job: [id number]

## 2011-01-26 NOTE — Cardiovascular Report (Signed)
[place]. [facility]  Patient:    Kevin Flores, Kevin Flores                       MRN: [id number] Proc. Date: 08/15/00 Adm. Date:  [id number] Attending:  Ronaldo Flores CC:         Kevin Flores, M.D.  Kevin Flores. Kevin Flores, M.D. [facility]  Kevin Flores, M.D. [facility]  Cardiopulmonary Lab   Cardiac Catheterization  CARDIOLOGIST:  Kevin Flores. Kevin Flores, M.D. [facility]  CLINICAL HISTORY:  Mr. Kevin Flores is 70 years old and had previous bypass surgery in 1998.  About six weeks ago, he had stenting of the right coronary artery. He was admitted yesterday with recurrent symptoms of tingling and burning in his chest similar to what he had prior to his stent implantation but different than what he had prior to his bypass surgery which was predominantly exertional dyspnea.  A decision was made to evaluate him with catheterization.  PROCEDURE:  Heart catheterization.  DESCRIPTION OF PROCEDURE:  The procedure was performed by the right femoral artery and arterial sheath and 6-French preformed coronary catheters.  An arterial punch was performed, and Omnipaque contrast was used.  The vein graft was injected with a JR4 diagnostic right catheter.  The LIMA catheter was injected with a LIMA catheter.  We had some difficulty navigating into his ______  vein because of large vertebral into which the wire had a preference to go.   The patient tolerated the procedure well and left the laboratory in satisfactory condition.  RESULT:  Left main coronary artery has 50% stenosis near the ostium.  Left anterior descending artery was diffusely diseased proximally with approximately 80% segmental stenosis and competing flow distally after a diagonal branch and septal perforator.  The circumflex artery gave rise to an atrial branch, marginal branch, and two posterolateral branches.  There was 40% segmental stenosis in the proximal portion.  The vein graft was visualized from the native  injection.  The right coronary artery was a dominant vessel and gave rise to a right ventricular branch, a posterior descending branch, and a posterolateral branch.  This was 0% stenosis within the stent in the mid right coronary artery.  There was 80% narrowing at the ostium of the right ventricular branch which arose from the stent.  This had not changed from its description on the post stent implantation.  The saphenous vein graft to the posterolateral branch of the circumflex artery was patent and functioned normally.  The LIMA graft to the LAD was patent and functioned normally.  The left ventriculogram performed in the RAO projection showed good wall motion with no evidence of hypokinesis.  The estimated ejection fraction of 50%.  HEMODYNAMIC DATA:  The aortic pressure was 158/87 with a mean of 113.  The left ventricular pressure was 158/23.  CONCLUSION:  Coronary artery disease status post coronary artery bypass graft surgery in 1996 and status post stenting of the right coronary artery July 08, 2000, with 50% narrowing in the left main artery, 80% narrowing in the proximal left anterior descending artery, 40% narrowing in the proximal circumflex artery, 0% stenosis of the stent site in the mid right coronary artery with 80% stenosis in a marginal branch and 40% stenosis in a posterior descending branch, with a patent graft to the circumflex posterolateral graft and a patent left internal mammary artery graft to the left anterior descending artery and normal left ventricular function.  RECOMMENDATIONS: I think it  is unlikely that the lesion at the ostium of the right ventricular branch is causing the patients symptoms.  I suspect the symptoms are probably noncardiac.  We will plan to continue the proton pump inhibitor that Dr. Christell Flores started yesterday and have him follow up with Dr. Christell Flores in five days and make him an outpatient today. DD:  08/15/00 TD:  08/16/00 Job:  [id number]

## 2011-01-26 NOTE — Procedures (Signed)
[facility]  Patient:    Kevin Flores, Kevin Flores                       MRN: [id number] Proc. Date: 11/01/00 Adm. Date:  [id number] Disc. Date: [id number] Attending:  Nelda Marseille CC:         Arturo Morton. Riley Kill, M.D. [facility]  Monica Becton, M.D.   Procedure Report  PROCEDURE:  Esophagogastroduodenoscopy with biopsy.  INDICATIONS FOR PROCEDURE:  Upper tract symptoms, some dysphagia actually better since I saw him in the office.  Consent was signed after risks, benefits, methods, and options were thoroughly discussed in the office.  MEDICINES USED:  Demerol 50, Versed 5.  DESCRIPTION OF PROCEDURE:  The video endoscope was inserted by direct vision. The esophagus was normal. He did not have an obvious ring or stricture or obvious hiatal hernia and his distal esophagus seemed to be tapered but the scope passed easily through it and based on improved dysphagia elected not to dilate him at this time. The scope was inserted into the stomach, advanced through the antrum pertinent for some minimal antritis through a normal pylorus into a normal duodenal bulb and around the C loop to a normal second portion of the duodenum. The scope was withdrawn back to the bulb and a good look there ruled out ulcers in that location. The scope was withdrawn back to the stomach and retroflexed. The cardia and fundus were normal. The angularis, lesser and greater curve were normal. The scope was straightened and straight visualization of the stomach confirmed some mild gastritis, antritis but ruled out any other abnormalities. Two biopsies of the antrum and two of the proximal stomach were obtained to rule out Helicobacter. Air was suctioned, the scope slowly withdrawn. Again a good look at the esophagus on slow withdrawal confirmed the above findings and no abnormalities were seen. The scope was removed. The patient tolerated the procedure well and there was no obvious or  immediate complication.  ENDOSCOPIC DIAGNOSIS:  1. Tapered distal esophagus without ring, stricture, or hiatal hernia.  2. Normal gastritis, antritis status post biopsy.  3. Otherwise normal EGD.  PLAN:  Await pathology. Hold dilatation for now. Have him call me if swallowing gets worse. Consideration of a one time barium swallow. Continue Nexium since that seems to be helping and follow-up p.r.n. or in three months. DD:  11/01/00 TD:  11/04/00 Job: [id number]

## 2011-01-26 NOTE — Cardiovascular Report (Signed)
[place]. [facility]  Patient:    Kevin Flores, Kevin Flores                       MRN: [id number] Proc. Date: 06/21/00 Adm. Date:  [id number] Attending:  Talitha Givens CC:         Cecil Cranker, M.D. [facility]  Monica Becton, M.D.   Cardiac Catheterization  PROCEDURE PERFORMED:  Coronary arteriography.  INDICATIONS:  Chest pain, previous CABG.  CORONARY ARTERIOGRAPHY:  Left main coronary artery:  The left main coronary artery had 90% diffuse disease.  Left anterior descending artery:  The left anterior descending artery had 90% diffuse proximal disease and before the takeoff of the first diagonal branch.  There was a 100% occlusion.  Circumflex coronary artery:  The circumflex coronary artery had a 60% proximal lesion.  There was competitive flow and in a large first obtuse marginal branch which had 30% multiple discrete lesions.  Right coronary artery:  The right coronary artery was dominant.  There was 30% multiple discrete lesions proximally.  The mid vessel just after the RV branch had a 60-70% discrete lesion.  The ostium of the RV branch also had a 70% discrete lesion.  The left internal mammary artery was somewhat difficult to cannulate due to tortuosity.  It was widely patent to the mid LAD.  The saphenous vein graft to the large obtuse marginal branch was widely patent with retrograde flow up to the left main.  RIGHT ANTERIOR OBLIQUE VENTRICULOGRAPHY:  The RAO ventriculography was normal. Ejection fraction is in the 60% range.  There was no gradient across the aortic valve and no MR.  Aortic pressure was 162/87.  LV pressure was 162/15.  IMPRESSION:  Mr. Braman symptoms are somewhat atypical with a upper respiratory infection and pain with coughing.  He had no electrocardiogram changes and ruled out.  The right coronary artery lesion is somewhat borderline.  He will have an adenosine Cardiolite study tomorrow.  If there is inferior  wall ischemia, he will have subsequently percutaneous transluminal coronary angioplasty of the mid right coronary artery.  If there is no ischemia continued, medical therapy is warranted. DD:  06/21/00 TD:  06/22/00 Job: [id number]

## 2011-01-26 NOTE — Discharge Summary (Signed)
[place]. [facility]  Patient:    Kevin Flores, Kevin Flores                       MRN: [id number] Adm. Date:  [id number] Disc. Date: [id number] Attending:  Talitha Givens Dictator:   Rozell Searing, P.A. CC:         Monica Becton, M.D.   Referring Physician Discharge [facility]  PROCEDURES: 1. Cardiac catheterization, October 12th. 2. Adenosine Cardiolite, October 13th.  REASON FOR ADMISSION:  The patient is a 70 year old male with known CAD status post two-vessel CABG in November 1998, followed by Dr. Sarajane Jews who presents with a two-day history of intermittent and worsening chest pain associated with diaphoresis and near syncope.  He is admitted for rule out MI and further diagnostic evaluation.  LABORATORY DATA:  Cardiac enzymes negative times three.   Normal metabolic profile and LFTs.  Normal CBC.  Admission chest x-ray; NAD.  HOSPITAL COURSE:  Following admission the patient ruled out for an MI with negative serial cardiac enzymes.  Cardiac catheterization by Dr. Burna Forts (see cath report for full details) revealed patent L-LIMA and patent S-OM with normal LV function.  Regarding the native arteries there was 90% left main coronary artery, 90% diffuse proximal, 100% mid LAD, 60% proximal circumflex, competitive flow OM-1, 30% OM-2, and 60-70% mid RCA.  Dr. Ricki Miller recommendations was to proceed with a post cath Cardiolite the following morning and if there was any evidence of inferior ischemia proceed with PTCA of the RCA the following Monday.  The patient underwent adenosine Cardiolite with no significant ST abnormalities.  Subsequent perfusion images reviewed by Dr. Judie Petit. Pulsipher revealed a small area of potential ischemia involving the distal anterior wall/anterior apex, and calculated EF 49%.  Dr. Gerri Spore noted that the inferior territory was not abnormal for ischemia and that the patient could be discharged on medical regimen.  The  recommendation was to add oral nitrate.  DISCHARGE MEDICATIONS:  Imdur 30 mg q.d.  Lipitor 10 mg q.d.  Toprol 25 mg q.o.d.  Aspirin 325 mg q.d.  Altace 5 mg b.i.d.  Nitrostat as directed.  DISCHARGE INSTRUCTIONS:  Refrain from any strenuous activities; driving times two days.  Maintain low-fat/cholesterol diet and call the office if there is any swelling/bleeding of the groin.  The patient is instructed to call and schedule a follow up appointment with Dr. Howard Pouch. [place] in approximately two weeks.  DISCHARGE DIAGNOSES: 1. Chest pain with known coronary artery disease:    a) negative serial cardiac enzymes;    b) cardiac catheterization October 12 - patent grafts, normal left       ventricular function, residual 60-70% mid right coronary artery;    c) adenosine Cardiolite October 13 - no inferior ischemia and ejection       fraction 49%, and;    d) status post two-vessel coronary artery bypass graft November 1998. 2. Sinus bradycardia. 3. Hypertension. 4. Hyperlipidemia. DD:  06/22/00 TD:  06/22/00 Job: [id number]

## 2011-01-26 NOTE — Discharge Summary (Signed)
[place]. [facility]  Patient:    Kevin Flores, Kevin Flores                       MRN: [id number] Adm. Date:  [id number] Disc. Date: 07/09/00 Attending:  Daisey Must Dictator:   Lavella Hammock, P.A. CC:         Monica Becton, M.D. at [facility]                  Referring Physician Discharge [facility]  DATE OF BIRTH:  1940/11/25  PROCEDURES: 1. Cardiac catheterization. 2. Coronary arteriogram. 3. Left ventriculogram. 4. PTCA and stent of one vessel and PTCA of another vessel.  HOSPITAL COURSE:  Kevin Flores is a 70 year old male with a history of bypass surgery in 1998 who was admitted for chest pain on July 05, 2000.  The pain was decreased but not relieved by nitroglycerin x 2 sublingually, and he came o the emergency room where he received oxygen and additional nitroglycerin, and was pain-free at the time of exam.  He was admitted to rule out MI and for further evaluation.  He had enzymes that were negative for MI, and was scheduled for a cardiac catheterization.  However, he continued to have episodes of pain that were relieved by nitroglycerin, and he was scheduled for a cardiac catheterization.  The catheterization was done on July 08, 2000 and showed left main with a 70% lesion, an LAD with an 80% and then a 99% lesion.  The first diagonal had an 80% lesion and there was a branch of the first diagonal that had a 99% lesion.  The circumflex had a 70% stenosis.  The RCA had a 30% proximal stenosis, an 80% mid stenosis, and 50% in the PDA.  There was an RV branch that had an 80% stenosis also.  The LIMA to LAD was patent and the SVG to OM-1 was patent.  He had PTCA and stent of the mid RCA and PTCA of the RV branch. The stenosis in the RCA was reduced from 80% to 0%.  The side branch at 80% was unchanged.  He tolerated the procedure well and the sheath was removed without difficulty.  The next day, he was ambulatory without  any difficulty and the catheterization site had no bruit, hematoma, or ecchymosis.  His post procedure CK-MB was negative.  His follow-up laboratory values were within normal limits, and he was considered stable for discharge on July 09, 2000.  LABORATORY VALUES:  Chest x-ray:  Cardiomegaly with no acute process.  Hemoglobin 14.4, hematocrit 40.4, wbcs 8.5, platelets 194.  Sodium 136, potassium 3.9, chloride 102, CO2 28, BUN 9, creatinine 1.1, glucose 111.  Post procedure CK-MB negative.  DISCHARGE CONDITION:  Improved.  CONSULTS:  None.  COMPLICATIONS:  None.  DISCHARGE DIAGNOSES: 1. Coronary artery disease, status post percutaneous transluminal coronary    angioplasty and stent to the right coronary artery this admission, with    percutaneous transluminal coronary angioplasty to a right ventricular    branch also. 2. History of aortocoronary bypass in 1998 with left internal mammary artery    to left anterior descending artery and saphenous vein graft to first    obtuse marginal, grafts patent by catheterization this admission. 3. Preserved left ventricular function. 4. Hypertension. 5. Hyperlipidemia. 6. Family history of coronary artery disease. 7. History of ALLERGY to SULFA. 8. Recent onset of dry cough with occasional bloody nasal drainage, possibly  secondary to seasonal allergy dryness.  DISCHARGE INSTRUCTIONS:  ACTIVITY:  His activity level is to include no driving, no sexual or strenuous activity for two days.  DIET:  He is to stick to a low fat diet.  WOUND CARE:  He is to call the office for bleeding, swelling, or drainage at the catheterization site.  FOLLOW-UP:  He is to follow up with Dr. Glennon Hamilton, an appointment will be arranged prior to discharge.  He is to follow up with Dr. Christell Constant as needed.  DISCHARGE MEDICATIONS: 1. Coated aspirin 325 mg q.d. 2. Toprol XL 25 mg one-half tablet q.d. 3. Diovan 80 mg p.o. q.d. 4. Plavix 75 mg p.o. q.d. 5.  Coated aspirin 325 mg p.o. q.d. 6. Imdur 30 mg p.o. q.d. 7. Lipitor 10 mg p.o. q.d. 8. Nitroglycerin p.r.n.  He is not to take Altace for now. DD:  07/09/00 TD:  07/09/00 Job: [id number]

## 2011-01-26 NOTE — Cardiovascular Report (Signed)
[place]. [facility]  Patient:    Kevin Flores, Kevin Flores                       MRN: [id number] Proc. Date: 07/08/00 Adm. Date:  [id number] Attending:  Daisey Must CC:         Monica Becton, M.D.  CV Laboratory  Bea Laura Graceann Congress, M.D. [facility]   Cardiac Catheterization  INDICATIONS:  The patient was previously admitted with chest pain.  The current study was done to assess coronary anatomy because of recurrent chest pain.  PROCEDURE:  Cardiac catheterization.  CARDIOLOGIST:  Arturo Morton. Riley Kill, M.D. [facility]  DESCRIPTION OF THE PROCEDURE:  The procedure was performed from the right femoral artery using 6-French catheters.  He tolerated the procedure without complication.  I reviewed the films with Dr. Gerri Spore.  PTCA and stenting were performed of the mid right coronary artery using a JR4 guiding catheter with side holes.  A 0.014 high torque floppy wire was used.  The main portion of the mid right coronary was dilated using a 3.25 mm Quantum Ranger balloon initially.  This was followed by 4 mm, 20 mm length radius stent.  The mid portion of the stent at the side of the lesion was then dilated using 3.5 mm Maverick balloon. This was done to avoid the side branch.  There was some pinching of the side branch although only slightly worse than at baseline.  We were able to wire this, and we placed a small 2 mm balloon at the site and took this up at 8 atmospheres with some perhaps slight improvement in the appearance of the vessel at this point.  There were no complications.  Heparin and Integrilin were given according to protocol.  ACT was as projected to be greater than 200 seconds.  HEMODYNAMIC DATA:  The central aortic pressure was 130/80.  ANGIOGRAPHIC DATA: The left main coronary artery demonstrated diffuse 70% narrowing.  The proximal left anterior descending artery that was diffusely diseased with about 80% narrowing had provided a smaller  diagonal branch which bifurcated. Both bifurcations had disease with the more medial branch having 80% and the more lateral branch being subtotally occluded.  The lateral branch filled faintly by collaterals.  The LAD itself was subtotally occluded.  The internal mammary to the distal LAD was widely patent.  The native circumflex had about 70% narrowing.  The saphenous vein graft to the large marginal was widely patent.  The right coronary artery demonstrated about 30 to 40% proximal narrowing. There was about 80% mid narrowing prior to stenting.  Following stenting, this was reduced to 0%.  The 80% stenosis at the ostium of the right ventricular branch was virtually unchanged.  The posterior descending branch had about 50% narrowing.  CONCLUSIONS: 1. Continued patency of the saphenous vein graft to the obtuse marginal. 2. Continued patency of the internal mammary to the left anterior descending    artery. 3. Diffuse disease of the small diagonal territory. 4. Successful stenting of the mid right coronary without change in the right    ventricular branch. 5. Modest disease of the proximal posterior descending branch.  DISPOSITION:  The patient will be treated medically at this point.  He could potentially have recurrent angina, but we will try to get by without much change. DD:  07/08/00 TD:  07/08/00 Job: [id number]

## 2011-02-01 ENCOUNTER — Telehealth: Payer: Self-pay | Admitting: *Deleted

## 2011-02-01 ENCOUNTER — Ambulatory Visit (AMBULATORY_SURGERY_CENTER): Payer: Medicare Other | Admitting: *Deleted

## 2011-02-01 VITALS — Ht 69.0 in | Wt 189.0 lb

## 2011-02-01 DIAGNOSIS — Z86010 Personal history of colonic polyps: Secondary | ICD-10-CM

## 2011-02-01 MED ORDER — PEG-KCL-NACL-NASULF-NA ASC-C 100 G PO SOLR: ORAL | Status: DC

## 2011-02-01 NOTE — Telephone Encounter (Signed)
Informed wife that colon is to be done this year/ Dr. Leone Payor and his prep was sent to his pharmacy and his prep instructions have been mailed.  He does not need an office visit/Dr. Leone Payor.  He will need to stop his plavix 5-7 days before his procedure and we will obtain permission to do so from Dr. Christell Constant.  If pt. Has not heard from [place] by 02/07/11 re; plavix he is to call and ask for Alvino Chapel.

## 2011-02-01 NOTE — Progress Notes (Signed)
Dr. Leone Payor said pt is due a colon this year and to have nurse send a letter to Dr. Christell Constant re:  Stopping plavix prior to colonoscopy.  Pt. Notified of go ahead with his colon 02/15/11 and that he will be contacted re: stopping his plavix.  Colon instruction mailed to pt.

## 2011-02-01 NOTE — Progress Notes (Signed)
Addended by: Sarina Ill ANN on: 02/01/2011 12:23 PM   Modules accepted: Orders

## 2011-02-01 NOTE — Progress Notes (Deleted)
Pt. Not due for a colon until 2014.  Also he is on plavix and would need an office visit prior to receiving instructions for a colon.

## 2011-02-06 ENCOUNTER — Telehealth: Payer: Self-pay

## 2011-02-06 NOTE — Telephone Encounter (Signed)
Per letter received back from Dr Marcelline Deist office.  Patient is ok to hold plavix for his upcoming procedure on 02/15/11.  He is advised that tomorrow will be his last dose and he will be given instructions on when to resume by Dr Leone Payor at the time of the procedure.  Patient verbalized understanding.

## 2011-02-15 ENCOUNTER — Ambulatory Visit (AMBULATORY_SURGERY_CENTER): Payer: Medicare Other | Admitting: Internal Medicine

## 2011-02-15 VITALS — BP 129/69 | HR 55 | Temp 96.7°F | Resp 20 | Ht 69.0 in | Wt 189.0 lb

## 2011-02-15 DIAGNOSIS — Z1211 Encounter for screening for malignant neoplasm of colon: Secondary | ICD-10-CM

## 2011-02-15 DIAGNOSIS — K648 Other hemorrhoids: Secondary | ICD-10-CM

## 2011-02-15 DIAGNOSIS — Z86010 Personal history of colonic polyps: Secondary | ICD-10-CM

## 2011-02-15 MED ORDER — SODIUM CHLORIDE 0.9 % IV SOLN: 500.0000 mL | INTRAVENOUS | Status: DC

## 2011-02-15 NOTE — Patient Instructions (Addendum)
Restart Plavix today. Please review discharge instructions Please review information about hemorrhoids

## 2011-02-16 ENCOUNTER — Telehealth: Payer: Self-pay | Admitting: *Deleted

## 2011-02-16 NOTE — Telephone Encounter (Signed)
Follow up Call- Patient questions:  Do you have a fever, pain , or abdominal swelling? no Pain Score  0 *  Have you tolerated food without any problems? yes  Have you been able to return to your normal activities? no  Do you have any questions about your discharge instructions: Diet   no Medications  no Follow up visit  no  Do you have questions or concerns about your Care? no  Actions: * If pain score is 4 or above: No action needed, pain <4.   

## 2011-05-10 ENCOUNTER — Encounter: Payer: Self-pay | Admitting: Cardiology

## 2011-05-21 ENCOUNTER — Other Ambulatory Visit: Payer: Self-pay | Admitting: Family Medicine

## 2011-05-21 ENCOUNTER — Ambulatory Visit
Admission: RE | Admit: 2011-05-21 | Discharge: 2011-05-21 | Disposition: A | Discharge status: Home / Self Care | Payer: Medicare Other | Source: Ambulatory Visit | Attending: Family Medicine | Admitting: Family Medicine

## 2011-05-21 DIAGNOSIS — M25562 Pain in left knee: Secondary | ICD-10-CM

## 2011-08-23 ENCOUNTER — Telehealth: Payer: Self-pay | Admitting: *Deleted

## 2011-08-23 NOTE — Telephone Encounter (Signed)
Per Dr Christell Constant - pt is having a lot of bruising and bleeding on Plavix.  He would like Dr Antoine Poche to determine if pt needs to remain on Plavix or if he can discontinue it.  Will forward information to MD for review and orders.

## 2011-08-26 NOTE — Telephone Encounter (Signed)
OK to stop Plavix

## 2011-08-27 NOTE — Telephone Encounter (Signed)
Pt aware ok to D/C Plavix.

## 2011-09-06 ENCOUNTER — Telehealth: Payer: Self-pay | Admitting: Cardiology

## 2011-09-06 NOTE — Telephone Encounter (Signed)
Kevin Flores from Dr [facility] office was notified that Dr Antoine Poche stopped pt's Plavix on 08/23/11.

## 2011-09-06 NOTE — Telephone Encounter (Signed)
They need to know if the pt plavix can be refilled because he has had a lot of bruising plz call and advise

## 2011-11-10 ENCOUNTER — Other Ambulatory Visit: Payer: Self-pay | Admitting: Cardiology

## 2011-11-13 ENCOUNTER — Telehealth: Payer: Self-pay | Admitting: Cardiology

## 2011-11-13 MED ORDER — HYDROCHLOROTHIAZIDE 12.5 MG PO TABS: 12.5000 mg | ORAL_TABLET | Freq: Every day | ORAL | Status: DC

## 2011-11-13 MED ORDER — ROSUVASTATIN CALCIUM 20 MG PO TABS: 20.0000 mg | ORAL_TABLET | Freq: Every day | ORAL | Status: DC

## 2011-11-13 NOTE — Telephone Encounter (Signed)
New msg Pt's wife called about hctz. She said pharmacy said they wont fill until he makes an appt. She said he needs appt in [facility] and Dr Antoine Poche doesn't have any opening until April 10. She wants to know if he should not take this med until then. Please call

## 2011-11-13 NOTE — Telephone Encounter (Signed)
Refills given and pt is scheduled for follow up in the [place] office.

## 2011-11-26 DIAGNOSIS — J40 Bronchitis, not specified as acute or chronic: Secondary | ICD-10-CM | POA: Diagnosis not present

## 2011-11-26 DIAGNOSIS — R05 Cough: Secondary | ICD-10-CM | POA: Diagnosis not present

## 2011-12-19 ENCOUNTER — Encounter: Payer: Self-pay | Admitting: Cardiology

## 2011-12-19 ENCOUNTER — Ambulatory Visit (INDEPENDENT_AMBULATORY_CARE_PROVIDER_SITE_OTHER): Payer: Medicare Other | Admitting: Cardiology

## 2011-12-19 VITALS — BP 140/70 | HR 61 | Ht 69.0 in | Wt 196.0 lb

## 2011-12-19 DIAGNOSIS — E785 Hyperlipidemia, unspecified: Secondary | ICD-10-CM

## 2011-12-19 DIAGNOSIS — I2510 Atherosclerotic heart disease of native coronary artery without angina pectoris: Secondary | ICD-10-CM | POA: Diagnosis not present

## 2011-12-19 DIAGNOSIS — I10 Essential (primary) hypertension: Secondary | ICD-10-CM | POA: Diagnosis not present

## 2011-12-19 NOTE — Patient Instructions (Signed)
The current medical regimen is effective;  continue present plan and medications.  Follow up in 1 year with Dr Hochrein.  You will receive a letter in the mail 2 months before you are due.  Please call us when you receive this letter to schedule your follow up appointment.  

## 2011-12-19 NOTE — Assessment & Plan Note (Signed)
The patient has no new sypmtoms.  No further cardiovascular testing is indicated.  We will continue with aggressive risk reduction and meds as listed.  I will plan to bring him back next year for a screening stress test.

## 2011-12-19 NOTE — Assessment & Plan Note (Signed)
The blood pressure is at target. No change in medications is indicated. We will continue with therapeutic lifestyle changes (TLC).  

## 2011-12-19 NOTE — Progress Notes (Signed)
   HPI The patient presents for followup of known coronary disease. Since I last saw him he has done well. He has had none of the symptoms that indicated his previous coronary disease. He denies chest pressure, neck or arm discomfort. He has had no palpitations, presyncope or syncope. He denies any PND or orthopnea. He is active. He has been limited slightly by some knee pain and might need arthroscopic treatment form cartilage.  Allergies  Allergen Reactions  . Sulfonamide Derivatives     Current Outpatient Prescriptions  Medication Sig Dispense Refill  . AMLODIPINE BESYLATE PO Take 10 mg by mouth daily.        Marland Kitchen aspirin 325 MG tablet Take 325 mg by mouth daily.        . cholecalciferol (VITAMIN D) 1000 UNITS tablet Take 1,000 Units by mouth daily.        . fish oil-omega-3 fatty acids 1000 MG capsule Take 1 g by mouth daily.        . hydrochlorothiazide (HYDRODIURIL) 12.5 MG tablet Take 1 tablet (12.5 mg total) by mouth daily.  30 tablet  1  . levothyroxine (SYNTHROID, LEVOTHROID) 50 MCG tablet Take 50 mcg by mouth daily.        . metoprolol (TOPROL-XL) 50 MG 24 hr tablet       . rosuvastatin (CRESTOR) 20 MG tablet Take 1 tablet (20 mg total) by mouth daily.  30 tablet  1  . vitamin C (ASCORBIC ACID) 500 MG tablet Take 500 mg by mouth daily.       Current Facility-Administered Medications  Medication Dose Route Frequency Provider Last Rate Last Dose  . 0.9 %  sodium chloride infusion  500 mL Intravenous Continuous Iva Boop, MD        Past Medical History  Diagnosis Date  . Allergy     seasonal  . Arthritis   . Hyperlipidemia   . Hypertension   . Thyroid disease   . Personal history of colonic polyps     adenomas    Past Surgical History  Procedure Date  . Coronary artery bypass graft 1998    2 bypass  . Cardiac stents 2001   and 14782  . Colonoscopy w/ polypectomy 2009    3 small adenomas  . Upper gastrointestinal endoscopy     ROS:  Cold like symptoms  recently.  Torn cartilage left knee.  Otherwise as stated in the HPI and negative for all other systems.  PHYSICAL EXAM BP 140/70  Pulse 61  Ht 5\' 9"  (1.753 m)  Wt 196 lb (88.905 kg)  BMI 28.94 kg/m2 GENERAL:  Well appearing HEENT:  Pupils equal round and reactive, fundi not visualized, oral mucosa unremarkable NECK:  No jugular venous distention, waveform within normal limits, carotid upstroke brisk and symmetric, no bruits, no thyromegaly LYMPHATICS:  No cervical, inguinal adenopathy LUNGS:  Clear to auscultation bilaterally BACK:  No CVA tenderness CHEST:  Well healed sternotomy scar. HEART:  PMI not displaced or sustained,S1 and S2 within normal limits, no S3, no S4, no clicks, no rubs, no murmurs ABD:  Flat, positive bowel sounds normal in frequency in pitch, no bruits, no rebound, no guarding, no midline pulsatile mass, no hepatomegaly, no splenomegaly EXT:  2 plus pulses throughout, no edema, no cyanosis no clubbing  EKG:  Sinus, rate 61, axis within normal limits, old inferior infarct, no acute ST-T wave changes. 12/19/2011   ASSESSMENT AND PLAN

## 2011-12-19 NOTE — Assessment & Plan Note (Signed)
His last LDL was 52 with an HDL of 33.  He will continue on current therapy.

## 2012-01-06 ENCOUNTER — Other Ambulatory Visit: Payer: Self-pay | Admitting: Cardiology

## 2012-01-07 NOTE — Telephone Encounter (Signed)
..   Requested Prescriptions   Pending Prescriptions Disp Refills  . hydrochlorothiazide (MICROZIDE) 12.5 MG capsule [Pharmacy Med Name: HYDROCHLOROTHIAZIDE 12.5 MG CP] 30 capsule 11    Sig: TAKE 1 CAPSULE EVERY DAY

## 2012-01-21 ENCOUNTER — Other Ambulatory Visit: Payer: Self-pay | Admitting: Cardiology

## 2012-01-21 NOTE — Telephone Encounter (Signed)
..   Requested Prescriptions   Pending Prescriptions Disp Refills  . CRESTOR 20 MG tablet [Pharmacy Med Name: CRESTOR 20 MG TABLET] 90 tablet 3    Sig: TAKE 1 TABLET (20 MG TOTAL) BY MOUTH DAILY.

## 2012-04-22 ENCOUNTER — Encounter: Payer: Self-pay | Admitting: Cardiology

## 2012-04-22 DIAGNOSIS — E785 Hyperlipidemia, unspecified: Secondary | ICD-10-CM | POA: Diagnosis not present

## 2012-04-22 DIAGNOSIS — H52229 Regular astigmatism, unspecified eye: Secondary | ICD-10-CM | POA: Diagnosis not present

## 2012-04-22 DIAGNOSIS — Z0000 Encounter for general adult medical examination without abnormal findings: Secondary | ICD-10-CM | POA: Diagnosis not present

## 2012-04-22 DIAGNOSIS — H524 Presbyopia: Secondary | ICD-10-CM | POA: Diagnosis not present

## 2012-04-22 DIAGNOSIS — I10 Essential (primary) hypertension: Secondary | ICD-10-CM | POA: Diagnosis not present

## 2012-04-22 DIAGNOSIS — I2510 Atherosclerotic heart disease of native coronary artery without angina pectoris: Secondary | ICD-10-CM | POA: Diagnosis not present

## 2012-04-22 DIAGNOSIS — M109 Gout, unspecified: Secondary | ICD-10-CM | POA: Diagnosis not present

## 2012-04-22 DIAGNOSIS — H2510 Age-related nuclear cataract, unspecified eye: Secondary | ICD-10-CM | POA: Diagnosis not present

## 2012-04-22 DIAGNOSIS — E119 Type 2 diabetes mellitus without complications: Secondary | ICD-10-CM | POA: Diagnosis not present

## 2012-04-22 DIAGNOSIS — N400 Enlarged prostate without lower urinary tract symptoms: Secondary | ICD-10-CM | POA: Diagnosis not present

## 2012-04-22 DIAGNOSIS — E039 Hypothyroidism, unspecified: Secondary | ICD-10-CM | POA: Diagnosis not present

## 2012-04-22 DIAGNOSIS — E559 Vitamin D deficiency, unspecified: Secondary | ICD-10-CM | POA: Diagnosis not present

## 2012-04-22 DIAGNOSIS — H5200 Hypermetropia, unspecified eye: Secondary | ICD-10-CM | POA: Diagnosis not present

## 2012-04-22 DIAGNOSIS — Z125 Encounter for screening for malignant neoplasm of prostate: Secondary | ICD-10-CM | POA: Diagnosis not present

## 2012-05-05 ENCOUNTER — Other Ambulatory Visit: Payer: Self-pay | Admitting: Cardiology

## 2012-06-30 DIAGNOSIS — Z23 Encounter for immunization: Secondary | ICD-10-CM | POA: Diagnosis not present

## 2012-08-19 DIAGNOSIS — R310 Gross hematuria: Secondary | ICD-10-CM | POA: Diagnosis not present

## 2012-08-19 DIAGNOSIS — R1012 Left upper quadrant pain: Secondary | ICD-10-CM | POA: Diagnosis not present

## 2012-08-19 DIAGNOSIS — N200 Calculus of kidney: Secondary | ICD-10-CM | POA: Diagnosis not present

## 2012-08-25 DIAGNOSIS — N200 Calculus of kidney: Secondary | ICD-10-CM | POA: Diagnosis not present

## 2012-08-25 DIAGNOSIS — R310 Gross hematuria: Secondary | ICD-10-CM | POA: Diagnosis not present

## 2012-08-28 DIAGNOSIS — R1012 Left upper quadrant pain: Secondary | ICD-10-CM | POA: Diagnosis not present

## 2012-08-28 DIAGNOSIS — R310 Gross hematuria: Secondary | ICD-10-CM | POA: Diagnosis not present

## 2012-08-28 DIAGNOSIS — Q619 Cystic kidney disease, unspecified: Secondary | ICD-10-CM | POA: Diagnosis not present

## 2012-08-28 DIAGNOSIS — N200 Calculus of kidney: Secondary | ICD-10-CM | POA: Diagnosis not present

## 2012-08-29 ENCOUNTER — Other Ambulatory Visit: Payer: Self-pay | Admitting: Urology

## 2012-08-29 DIAGNOSIS — N281 Cyst of kidney, acquired: Secondary | ICD-10-CM

## 2012-08-29 DIAGNOSIS — R109 Unspecified abdominal pain: Secondary | ICD-10-CM

## 2012-08-31 ENCOUNTER — Other Ambulatory Visit: Payer: Self-pay | Admitting: Physician Assistant

## 2012-09-01 ENCOUNTER — Other Ambulatory Visit: Payer: Self-pay | Admitting: Radiology

## 2012-09-01 ENCOUNTER — Encounter (HOSPITAL_COMMUNITY): Payer: Self-pay

## 2012-09-02 ENCOUNTER — Other Ambulatory Visit: Payer: Self-pay | Admitting: Physician Assistant

## 2012-09-04 ENCOUNTER — Other Ambulatory Visit (HOSPITAL_COMMUNITY): Payer: Medicare Other

## 2012-09-04 ENCOUNTER — Inpatient Hospital Stay (HOSPITAL_COMMUNITY): Admission: RE | Admit: 2012-09-04 | Payer: Medicare Other | Source: Ambulatory Visit

## 2012-09-05 ENCOUNTER — Encounter (HOSPITAL_COMMUNITY): Payer: Self-pay

## 2012-09-05 ENCOUNTER — Ambulatory Visit (HOSPITAL_COMMUNITY)
Admission: RE | Admit: 2012-09-05 | Discharge: 2012-09-05 | Disposition: A | Discharge status: Home / Self Care | Payer: Medicare Other | Source: Ambulatory Visit | Attending: Urology | Admitting: Urology

## 2012-09-05 DIAGNOSIS — R109 Unspecified abdominal pain: Secondary | ICD-10-CM | POA: Diagnosis not present

## 2012-09-05 DIAGNOSIS — N281 Cyst of kidney, acquired: Secondary | ICD-10-CM | POA: Insufficient documentation

## 2012-09-05 HISTORY — DX: Heartburn: R12

## 2012-09-05 HISTORY — DX: Gout, unspecified: M10.9

## 2012-09-05 HISTORY — DX: Calculus of kidney: N20.0

## 2012-09-05 LAB — PROTIME-INR: Prothrombin Time: 12.7 seconds (ref 11.6–15.2)

## 2012-09-05 LAB — CBC
MCV: 85 fL (ref 78.0–100.0)
Platelets: 155 10*3/uL (ref 150–400)
RDW: 13.5 % (ref 11.5–15.5)
WBC: 8.2 10*3/uL (ref 4.0–10.5)

## 2012-09-05 LAB — APTT: aPTT: 31 seconds (ref 24–37)

## 2012-09-05 MED ORDER — MIDAZOLAM HCL 2 MG/2ML IJ SOLN
INTRAMUSCULAR | Status: AC | PRN
  Administered 2012-09-05: 1 mg via INTRAVENOUS

## 2012-09-05 MED ORDER — HYDROCODONE-ACETAMINOPHEN 5-325 MG PO TABS
1.0000 | ORAL_TABLET | ORAL | Status: DC | PRN
  Filled 2012-09-05: qty 2

## 2012-09-05 MED ORDER — FENTANYL CITRATE 0.05 MG/ML IJ SOLN
INTRAMUSCULAR | Status: AC
  Filled 2012-09-05: qty 6

## 2012-09-05 MED ORDER — MIDAZOLAM HCL 2 MG/2ML IJ SOLN
INTRAMUSCULAR | Status: AC
  Filled 2012-09-05: qty 6

## 2012-09-05 MED ORDER — FENTANYL CITRATE 0.05 MG/ML IJ SOLN
INTRAMUSCULAR | Status: AC | PRN
  Administered 2012-09-05: 50 ug via INTRAVENOUS

## 2012-09-05 MED ORDER — SODIUM CHLORIDE 0.9 % IV SOLN
INTRAVENOUS | Status: DC
  Administered 2012-09-05: 12:00:00 via INTRAVENOUS

## 2012-09-05 NOTE — H&P (Signed)
Kevin Flores is an 71 y.o. male.   Chief Complaint: gross hematuria with left flank pain. HPI: CT performed at urology office revealed bilateral renal cysts with bilateral renal calculi. Left renal cyst may be contributing to his flank/back pain. Request has been made for needle aspiration of same.   Past Medical History  Diagnosis Date  . Allergy     seasonal  . Arthritis   . Hyperlipidemia   . Hypertension   . Thyroid disease   . Personal history of colonic polyps     adenomas  . CAD (coronary artery disease)     1998 LIMA to the LAD, SVG to circumflex. DES placed to the PDA 11/2009   . Gout   . Nephrolithiasis     left  . Heartburn     Past Surgical History  Procedure Date  . Coronary artery bypass graft 1998    2 bypass  . Cardiac stents 2001   and 16109  . Colonoscopy w/ polypectomy 2009    3 small adenomas  . Upper gastrointestinal endoscopy   . Biopsy prostate   . Cysto with calculus manipulation    Patient denies any complications with moderate sedation or general anesthesia.   Family History  Problem Relation Age of Onset  . Colon cancer Brother    Social History:  reports that he quit smoking about 40 years ago. He has never used smokeless tobacco. He reports that he does not drink alcohol or use illicit drugs.  Allergies:  Allergies  Allergen Reactions  . Sulfonamide Derivatives       Results for orders placed during the hospital encounter of 09/05/12 (from the past 48 hour(s))  APTT     Status: Normal   Collection Time   09/05/12 11:55 AM      Component Value Range Comment   aPTT 31  24 - 37 seconds   CBC     Status: Normal   Collection Time   09/05/12 11:55 AM      Component Value Range Comment   WBC 8.2  4.0 - 10.5 K/uL    RBC 5.05  4.22 - 5.81 MIL/uL    Hemoglobin 14.8  13.0 - 17.0 g/dL    HCT 60.4  54.0 - 98.1 %    MCV 85.0  78.0 - 100.0 fL    MCH 29.3  26.0 - 34.0 pg    MCHC 34.5  30.0 - 36.0 g/dL    RDW 19.1  47.8 - 29.5 %    Platelets 155  150 - 400 K/uL   PROTIME-INR     Status: Normal   Collection Time   09/05/12 11:55 AM      Component Value Range Comment   Prothrombin Time 12.7  11.6 - 15.2 seconds    INR 0.96  0.00 - 1.49     Review of Systems  Constitutional: Negative for fever, chills and weight loss.  Eyes: Negative.   Respiratory: Negative.   Cardiovascular: Negative.   Gastrointestinal: Positive for heartburn. Negative for abdominal pain and blood in stool.  Genitourinary: Positive for hematuria and flank pain.  Musculoskeletal: Positive for back pain.  Skin: Negative.   Neurological: Negative.  Negative for weakness.  Endo/Heme/Allergies: Negative.   Psychiatric/Behavioral: Negative.     There were no vitals taken for this visit. Physical Exam  Constitutional: He is oriented to person, place, and time. He appears well-developed and well-nourished. No distress.  Cardiovascular: Normal rate, regular rhythm and normal heart sounds.  Exam reveals no gallop and no friction rub.   No murmur heard. Respiratory: Effort normal and breath sounds normal. No respiratory distress. He has no wheezes. He has no rales.  GI: Soft. Bowel sounds are normal. He exhibits no distension.  Musculoskeletal: Normal range of motion. He exhibits no edema.  Neurological: He is alert and oriented to person, place, and time.  Skin: Skin is warm and dry.  Psychiatric: He has a normal mood and affect. His behavior is normal. Judgment and thought content normal.     Assessment/Plan Procedure details for renal cyst needle aspiration discussed with patient and family in detail with all their questions answered to his satisfaction. Labs reviewed and are appropriate to proceed with aspiration today. Written consent obtained.   CAMPBELL,PAMELA D 09/05/2012, 12:59 PM

## 2012-09-05 NOTE — H&P (Signed)
Agree with PA note.    Signed,  Azarya Oconnell K. Arrian Manson, MD Vascular & Interventional Radiologist [facility]  

## 2012-09-05 NOTE — Procedures (Signed)
Interventional Radiology Procedure Note  Procedure:  CT guided aspiration of left renal cysts (2) Complications: None Recommendations: - Bedrest x 1 hr - Fluid sent for cytology  Signed,  Sterling Big, MD Vascular & Interventional Radiologist [facility]

## 2012-11-03 DIAGNOSIS — N281 Cyst of kidney, acquired: Secondary | ICD-10-CM | POA: Diagnosis not present

## 2012-12-02 ENCOUNTER — Other Ambulatory Visit: Payer: Self-pay

## 2012-12-03 ENCOUNTER — Other Ambulatory Visit: Payer: Self-pay | Admitting: *Deleted

## 2012-12-03 MED ORDER — METOPROLOL SUCCINATE ER 50 MG PO TB24: 50.0000 mg | ORAL_TABLET | Freq: Every morning | ORAL | Status: DC

## 2012-12-03 MED ORDER — LEVOTHYROXINE SODIUM 50 MCG PO TABS: 50.0000 ug | ORAL_TABLET | Freq: Every morning | ORAL | Status: DC

## 2012-12-03 NOTE — Telephone Encounter (Signed)
Needs thyroid. Not one since 2011.

## 2012-12-05 ENCOUNTER — Other Ambulatory Visit: Payer: Self-pay | Admitting: *Deleted

## 2012-12-05 MED ORDER — METOPROLOL SUCCINATE ER 50 MG PO TB24: 50.0000 mg | ORAL_TABLET | Freq: Every morning | ORAL | Status: DC

## 2012-12-28 ENCOUNTER — Other Ambulatory Visit: Payer: Self-pay | Admitting: Cardiology

## 2012-12-31 ENCOUNTER — Other Ambulatory Visit: Payer: Self-pay | Admitting: Cardiology

## 2013-01-29 ENCOUNTER — Other Ambulatory Visit: Payer: Self-pay | Admitting: Cardiology

## 2013-01-29 NOTE — Telephone Encounter (Signed)
..   Requested Prescriptions   Pending Prescriptions Disp Refills  . CRESTOR 20 MG tablet [Pharmacy Med Name: CRESTOR 20 MG TABLET] 30 tablet 1    Sig: TAKE 1 TABLET EVERY DAY

## 2013-03-02 ENCOUNTER — Other Ambulatory Visit: Payer: Self-pay

## 2013-03-02 MED ORDER — AMLODIPINE BESYLATE 10 MG PO TABS: 10.0000 mg | ORAL_TABLET | Freq: Every day | ORAL | Status: DC

## 2013-03-02 NOTE — Telephone Encounter (Signed)
Last seen 8/13

## 2013-03-25 ENCOUNTER — Other Ambulatory Visit: Payer: Self-pay | Admitting: Cardiology

## 2013-03-30 ENCOUNTER — Other Ambulatory Visit: Payer: Self-pay | Admitting: Family Medicine

## 2013-03-30 ENCOUNTER — Other Ambulatory Visit: Payer: Self-pay | Admitting: Cardiology

## 2013-04-01 NOTE — Telephone Encounter (Signed)
Has not been seen since EPIC  Last refill 03/03/13  No refills

## 2013-04-29 ENCOUNTER — Other Ambulatory Visit: Payer: Self-pay | Admitting: Cardiology

## 2013-05-01 ENCOUNTER — Other Ambulatory Visit: Payer: Self-pay | Admitting: Cardiology

## 2013-05-01 ENCOUNTER — Other Ambulatory Visit: Payer: Self-pay | Admitting: Family Medicine

## 2013-05-04 ENCOUNTER — Encounter: Payer: Self-pay | Admitting: Family Medicine

## 2013-05-04 ENCOUNTER — Ambulatory Visit (INDEPENDENT_AMBULATORY_CARE_PROVIDER_SITE_OTHER): Payer: Medicare Other | Admitting: Family Medicine

## 2013-05-04 VITALS — BP 151/79 | HR 51 | Temp 97.1°F | Ht 69.0 in | Wt 202.0 lb

## 2013-05-04 DIAGNOSIS — Q619 Cystic kidney disease, unspecified: Secondary | ICD-10-CM | POA: Diagnosis not present

## 2013-05-04 DIAGNOSIS — N200 Calculus of kidney: Secondary | ICD-10-CM | POA: Diagnosis not present

## 2013-05-04 DIAGNOSIS — E785 Hyperlipidemia, unspecified: Secondary | ICD-10-CM

## 2013-05-04 DIAGNOSIS — R310 Gross hematuria: Secondary | ICD-10-CM | POA: Diagnosis not present

## 2013-05-04 DIAGNOSIS — E039 Hypothyroidism, unspecified: Secondary | ICD-10-CM | POA: Diagnosis not present

## 2013-05-04 DIAGNOSIS — I10 Essential (primary) hypertension: Secondary | ICD-10-CM

## 2013-05-04 MED ORDER — METOPROLOL SUCCINATE ER 50 MG PO TB24: 50.0000 mg | ORAL_TABLET | Freq: Every morning | ORAL | Status: DC

## 2013-05-04 MED ORDER — LEVOTHYROXINE SODIUM 50 MCG PO TABS: 50.0000 ug | ORAL_TABLET | Freq: Every morning | ORAL | Status: DC

## 2013-05-04 MED ORDER — ROSUVASTATIN CALCIUM 20 MG PO TABS: 20.0000 mg | ORAL_TABLET | Freq: Every day | ORAL | Status: DC

## 2013-05-04 MED ORDER — BENAZEPRIL-HYDROCHLOROTHIAZIDE 10-12.5 MG PO TABS: 1.0000 | ORAL_TABLET | Freq: Every day | ORAL | Status: DC

## 2013-05-04 MED ORDER — HYDROCHLOROTHIAZIDE 12.5 MG PO CAPS: 12.5000 mg | ORAL_CAPSULE | ORAL | Status: DC

## 2013-05-04 MED ORDER — AMLODIPINE BESYLATE 10 MG PO TABS: 10.0000 mg | ORAL_TABLET | Freq: Every day | ORAL | Status: DC

## 2013-05-04 NOTE — Progress Notes (Signed)
  Subjective:    Patient ID: Kevin Flores, male    DOB: [birth year], 72 y.o.   MRN: [id number]  HPI This 72 y.o. male presents for evaluation of  Needing refills and labs.  He has hx of Hypertension, hyperlipidemia, and CAD.  He is going to see his urologist today. He notices his bp is elevated and consistently runs above 140 systolic.   Review of Systems    No chest pain, SOB, HA, dizziness, vision change, N/V, diarrhea, constipation, dysuria, urinary urgency or frequency, myalgias, arthralgias or rash.  Objective:   Physical Exam Vital signs noted  Well developed well nourished male.  HEENT - Head atraumatic Normocephalic                Eyes - PERRLA, Conjuctiva - clear Sclera- Clear EOMI                Ears - EAC's Wnl TM's Wnl Gross Hearing WNL                Nose - Nares patent                 Throat - oropharanx wnl Respiratory - Lungs CTA bilateral Cardiac - RRR S1 and S2 without murmur GI - Abdomen soft Nontender and bowel sounds active x 4 Extremities - No edema. Neuro - Grossly intact.       Assessment & Plan:  Other and unspecified hyperlipidemia - Plan: rosuvastatin (CRESTOR) 20 MG tablet, Lipid panel  Essential hypertension, benign - Plan: amLODipine (NORVASC) 10 MG tablet, metoprolol succinate (TOPROL-XL) 50 MG 24 hr tablet, POCT CBC, CMP14+EGFR, benazepril-hydrochlorthiazide (LOTENSIN HCT) 10-12.5 MG per tablet, DISCONTINUED: hydrochlorothiazide (MICROZIDE) 12.5 MG capsule  Unspecified hypothyroidism - Plan: levothyroxine (SYNTHROID, LEVOTHROID) 50 MCG tablet, Thyroid Panel With TSH  Follow up in 6 months

## 2013-05-04 NOTE — Patient Instructions (Signed)

## 2013-05-04 NOTE — Addendum Note (Signed)
Addended by: Prescott Gum on: 05/04/2013 11:39 AM   Modules accepted: Orders

## 2013-05-05 LAB — LIPID PANEL
Chol/HDL Ratio: 3 ratio units (ref 0.0–5.0)
Cholesterol, Total: 110 mg/dL (ref 100–199)
HDL: 37 mg/dL — ABNORMAL LOW (ref >39)
LDL Calculated: 52 mg/dL (ref 0–99)
Triglycerides: 105 mg/dL (ref 0–149)
VLDL Cholesterol Cal: 21 mg/dL (ref 5–40)

## 2013-05-05 LAB — THYROID PANEL WITH TSH
Free Thyroxine Index: 2.5 (ref 1.2–4.9)
T3 Uptake Ratio: 28 % (ref 24–39)
T4, Total: 9 ug/dL (ref 4.5–12.0)
TSH: 3.59 u[IU]/mL (ref 0.450–4.500)

## 2013-05-05 LAB — CBC WITH DIFFERENTIAL
Basophils Absolute: 0 10*3/uL (ref 0.0–0.2)
Basos: 0 % (ref 0–3)
Eos: 2 % (ref 0–5)
Eosinophils Absolute: 0.1 10*3/uL (ref 0.0–0.4)
HCT: 45.1 % (ref 37.5–51.0)
Hemoglobin: 15.7 g/dL (ref 12.6–17.7)
Immature Grans (Abs): 0 10*3/uL (ref 0.0–0.1)
Immature Granulocytes: 0 % (ref 0–2)
Lymphocytes Absolute: 1.8 10*3/uL (ref 0.7–3.1)
Lymphs: 23 % (ref 14–46)
MCH: 29.3 pg (ref 26.6–33.0)
MCHC: 34.8 g/dL (ref 31.5–35.7)
MCV: 84 fL (ref 79–97)
Monocytes Absolute: 0.9 10*3/uL (ref 0.1–0.9)
Monocytes: 11 % (ref 4–12)
Neutrophils Absolute: 4.9 10*3/uL (ref 1.4–7.0)
Neutrophils Relative %: 64 % (ref 40–74)
Platelets: 166 10*3/uL (ref 150–379)
RBC: 5.35 x10E6/uL (ref 4.14–5.80)
RDW: 14.1 % (ref 12.3–15.4)
WBC: 7.7 10*3/uL (ref 3.4–10.8)

## 2013-05-05 LAB — CMP14+EGFR
ALT: 20 IU/L (ref 0–44)
AST: 23 IU/L (ref 0–40)
Albumin/Globulin Ratio: 1.6 (ref 1.1–2.5)
Albumin: 4.4 g/dL (ref 3.6–4.8)
Alkaline Phosphatase: 80 IU/L (ref 39–117)
BUN/Creatinine Ratio: 10 (ref 10–22)
BUN: 13 mg/dL (ref 8–27)
CO2: 25 mmol/L (ref 18–29)
Calcium: 9.9 mg/dL (ref 8.6–10.2)
Chloride: 100 mmol/L (ref 97–108)
Creatinine, Ser: 1.3 mg/dL — ABNORMAL HIGH (ref 0.76–1.27)
GFR calc Af Amer: 64 mL/min/{1.73_m2} (ref >59)
GFR calc non Af Amer: 56 mL/min/{1.73_m2} — ABNORMAL LOW (ref >59)
Globulin, Total: 2.7 g/dL (ref 1.5–4.5)
Glucose: 86 mg/dL (ref 65–99)
Potassium: 4.4 mmol/L (ref 3.5–5.2)
Sodium: 139 mmol/L (ref 134–144)
Total Bilirubin: 0.5 mg/dL (ref 0.0–1.2)
Total Protein: 7.1 g/dL (ref 6.0–8.5)

## 2013-06-29 ENCOUNTER — Other Ambulatory Visit: Payer: Self-pay | Admitting: Family Medicine

## 2013-07-14 ENCOUNTER — Ambulatory Visit (INDEPENDENT_AMBULATORY_CARE_PROVIDER_SITE_OTHER): Payer: Medicare Other

## 2013-07-14 DIAGNOSIS — Z23 Encounter for immunization: Secondary | ICD-10-CM

## 2013-08-19 ENCOUNTER — Telehealth: Payer: Self-pay | Admitting: Family Medicine

## 2013-08-19 ENCOUNTER — Encounter: Payer: Self-pay | Admitting: General Practice

## 2013-08-19 ENCOUNTER — Ambulatory Visit (INDEPENDENT_AMBULATORY_CARE_PROVIDER_SITE_OTHER): Payer: Medicare Other | Admitting: General Practice

## 2013-08-19 VITALS — BP 140/70 | HR 69 | Temp 98.7°F | Ht 69.0 in | Wt 197.0 lb

## 2013-08-19 DIAGNOSIS — J209 Acute bronchitis, unspecified: Secondary | ICD-10-CM

## 2013-08-19 MED ORDER — ALBUTEROL SULFATE HFA 108 (90 BASE) MCG/ACT IN AERS: 2.0000 | INHALATION_SPRAY | Freq: Four times a day (QID) | RESPIRATORY_TRACT | Status: DC | PRN

## 2013-08-19 MED ORDER — METHYLPREDNISOLONE ACETATE 80 MG/ML IJ SUSP
80.0000 mg | Freq: Once | INTRAMUSCULAR | Status: AC
  Administered 2013-08-19: 80 mg via INTRAMUSCULAR

## 2013-08-19 MED ORDER — PREDNISONE (PAK) 10 MG PO TABS: ORAL_TABLET | ORAL | Status: DC

## 2013-08-19 NOTE — Patient Instructions (Signed)

## 2013-08-19 NOTE — Progress Notes (Signed)
   Subjective:    Patient ID: Landrum Carbonell, male    DOB: Jul 26, 1941, 72 y.o.   MRN: [id number]  Cough This is a new problem. The current episode started in the past 7 days. The problem has been gradually worsening. The problem occurs every few minutes. The cough is non-productive. Associated symptoms include shortness of breath. Pertinent negatives include no chest pain, chills, ear congestion, fever, headaches, nasal congestion, postnasal drip, rhinorrhea, sore throat or wheezing. Associated symptoms comments: Shortness of breath when coughing. Nothing aggravates the symptoms. He has tried a beta-agonist inhaler for the symptoms.      Review of Systems  Constitutional: Negative for fever and chills.  HENT: Negative for congestion, postnasal drip, rhinorrhea and sore throat.   Respiratory: Positive for cough and shortness of breath. Negative for chest tightness and wheezing.   Cardiovascular: Negative for chest pain and palpitations.  Gastrointestinal: Negative for abdominal pain.  Neurological: Negative for dizziness, weakness and headaches.       Objective:   Physical Exam  Constitutional: He is oriented to person, place, and time. He appears well-developed and well-nourished.  HENT:  Head: Normocephalic and atraumatic.  Right Ear: External ear normal.  Left Ear: External ear normal.  Cardiovascular: Normal rate, regular rhythm and normal heart sounds.   Pulmonary/Chest: Effort normal. No respiratory distress. He has wheezes in the right upper field and the left upper field. He exhibits no tenderness.  Mild wheezing noted to bilateral upper lobes  Neurological: He is alert and oriented to person, place, and time.  Skin: Skin is warm and dry.  Psychiatric: He has a normal mood and affect.          Assessment & Plan:  1. Acute bronchitis - methylPREDNISolone acetate (DEPO-MEDROL) injection 80 mg; Inject 1 mL (80 mg total) into the muscle once. - predniSONE (STERAPRED UNI-PAK)  10 MG tablet; Start on 08/20/13  Dispense: 21 tablet; Refill: 0 - albuterol (PROVENTIL HFA;VENTOLIN HFA) 108 (90 BASE) MCG/ACT inhaler; Inhale 2 puffs into the lungs every 6 (six) hours as needed for wheezing or shortness of breath.  Dispense: 1 Inhaler; Refill: 3 -avoid irritants -RTO if symptoms worsen or unresolved -Patient verbalized understanding Coralie Keens, FNP-C

## 2013-08-19 NOTE — Telephone Encounter (Signed)
appt scheduled

## 2013-10-22 DIAGNOSIS — IMO0002 Reserved for concepts with insufficient information to code with codable children: Secondary | ICD-10-CM | POA: Diagnosis not present

## 2013-10-22 DIAGNOSIS — M25569 Pain in unspecified knee: Secondary | ICD-10-CM | POA: Diagnosis not present

## 2013-10-31 ENCOUNTER — Other Ambulatory Visit: Payer: Self-pay | Admitting: Family Medicine

## 2013-11-04 ENCOUNTER — Ambulatory Visit (INDEPENDENT_AMBULATORY_CARE_PROVIDER_SITE_OTHER): Payer: Medicare Other | Admitting: Family Medicine

## 2013-11-04 ENCOUNTER — Encounter: Payer: Self-pay | Admitting: Family Medicine

## 2013-11-04 VITALS — BP 128/64 | HR 56 | Temp 96.6°F | Ht 69.0 in | Wt 196.2 lb

## 2013-11-04 DIAGNOSIS — I10 Essential (primary) hypertension: Secondary | ICD-10-CM | POA: Diagnosis not present

## 2013-11-04 LAB — POCT CBC

## 2013-11-04 NOTE — Addendum Note (Signed)
Addended by: Monica BectonHODGES, Robi Mitter F on: 11/04/2013 03:28 PM   Modules accepted: Orders

## 2013-11-04 NOTE — Progress Notes (Signed)
   Subjective:    Patient ID: Kevin Flores, male    DOB: [birth year], 73 y.o.   MRN: [id number]  HPI  This 73 y.o. male presents for evaluation of routine follow up.  He has hx of hypertension and hyperlipidemia.  Review of Systems No chest pain, SOB, HA, dizziness, vision change, N/V, diarrhea, constipation, dysuria, urinary urgency or frequency, myalgias, arthralgias or rash.     Objective:   Physical Exam  Vital signs noted  Well developed well nourished male.  HEENT - Head atraumatic Normocephalic                Eyes - PERRLA, Conjuctiva - clear Sclera- Clear EOMI                Ears - EAC's Wnl TM's Wnl Gross Hearing WNL                Nose - Nares patent                 Throat - oropharanx wnl Respiratory - Lungs CTA bilateral Cardiac - RRR S1 and S2 without murmur GI - Abdomen soft Nontender and bowel sounds active x 4 Extremities - No edema. Neuro - Grossly intact.      Assessment & Plan:  Essential hypertension, benign - Plan: POCT CBC, CMP14+EGFR  Hyperlipidemia - Continue crestor.    Follow up in 6 months  Lysbeth Penner FNP

## 2013-11-05 ENCOUNTER — Ambulatory Visit: Payer: Medicare Other | Admitting: Family Medicine

## 2013-11-05 LAB — CBC WITH DIFFERENTIAL
Basophils Absolute: 0 10*3/uL (ref 0.0–0.2)
Basos: 0 %
Eos: 1 %
Eosinophils Absolute: 0.1 10*3/uL (ref 0.0–0.4)
HCT: 46.2 % (ref 37.5–51.0)
Hemoglobin: 15.9 g/dL (ref 12.6–17.7)
Immature Grans (Abs): 0 10*3/uL (ref 0.0–0.1)
Immature Granulocytes: 0 %
Lymphocytes Absolute: 2 10*3/uL (ref 0.7–3.1)
Lymphs: 16 %
MCH: 29.3 pg (ref 26.6–33.0)
MCHC: 34.4 g/dL (ref 31.5–35.7)
MCV: 85 fL (ref 79–97)
Monocytes Absolute: 1.2 10*3/uL — ABNORMAL HIGH (ref 0.1–0.9)
Monocytes: 10 %
Neutrophils Absolute: 9.1 10*3/uL — ABNORMAL HIGH (ref 1.4–7.0)
Neutrophils Relative %: 73 %
Platelets: 185 10*3/uL (ref 150–379)
RBC: 5.43 x10E6/uL (ref 4.14–5.80)
RDW: 13.9 % (ref 12.3–15.4)
WBC: 12.4 10*3/uL — ABNORMAL HIGH (ref 3.4–10.8)

## 2013-11-05 LAB — CMP14+EGFR
ALT: 21 IU/L (ref 0–44)
AST: 25 IU/L (ref 0–40)
Albumin/Globulin Ratio: 1.9 (ref 1.1–2.5)
Albumin: 4.7 g/dL (ref 3.5–4.8)
Alkaline Phosphatase: 66 IU/L (ref 39–117)
BUN/Creatinine Ratio: 17 (ref 10–22)
BUN: 30 mg/dL — ABNORMAL HIGH (ref 8–27)
CO2: 26 mmol/L (ref 18–29)
Calcium: 10.1 mg/dL (ref 8.6–10.2)
Chloride: 101 mmol/L (ref 97–108)
Creatinine, Ser: 1.75 mg/dL — ABNORMAL HIGH (ref 0.76–1.27)
GFR calc Af Amer: 45 mL/min/{1.73_m2} — ABNORMAL LOW (ref >59)
GFR calc non Af Amer: 39 mL/min/{1.73_m2} — ABNORMAL LOW (ref >59)
Globulin, Total: 2.5 g/dL (ref 1.5–4.5)
Glucose: 96 mg/dL (ref 65–99)
Potassium: 5.2 mmol/L (ref 3.5–5.2)
Sodium: 143 mmol/L (ref 134–144)
Total Bilirubin: 0.4 mg/dL (ref 0.0–1.2)
Total Protein: 7.2 g/dL (ref 6.0–8.5)

## 2013-12-02 ENCOUNTER — Other Ambulatory Visit: Payer: Self-pay | Admitting: Family Medicine

## 2013-12-28 DIAGNOSIS — N281 Cyst of kidney, acquired: Secondary | ICD-10-CM | POA: Diagnosis not present

## 2013-12-28 DIAGNOSIS — R1012 Left upper quadrant pain: Secondary | ICD-10-CM | POA: Diagnosis not present

## 2013-12-28 DIAGNOSIS — N200 Calculus of kidney: Secondary | ICD-10-CM | POA: Diagnosis not present

## 2014-01-30 ENCOUNTER — Other Ambulatory Visit: Payer: Self-pay | Admitting: Family Medicine

## 2014-02-02 NOTE — Telephone Encounter (Signed)
Lat seen 11/04/13 B [place]  Last lipid 05/04/13

## 2014-03-06 ENCOUNTER — Other Ambulatory Visit: Payer: Self-pay | Admitting: Family Medicine

## 2014-04-01 ENCOUNTER — Other Ambulatory Visit: Payer: Self-pay | Admitting: Family Medicine

## 2014-05-01 ENCOUNTER — Other Ambulatory Visit: Payer: Self-pay | Admitting: Family Medicine

## 2014-06-01 ENCOUNTER — Other Ambulatory Visit: Payer: Self-pay | Admitting: Family Medicine

## 2014-06-02 NOTE — Telephone Encounter (Signed)
Last seen 11/04/13  B Oxford 

## 2014-06-02 NOTE — Telephone Encounter (Signed)
Last seen 11/04/13 B [place]  Last thyroid level 05/04/13 

## 2014-06-25 ENCOUNTER — Other Ambulatory Visit: Payer: Self-pay

## 2014-07-02 ENCOUNTER — Other Ambulatory Visit: Payer: Self-pay | Admitting: Family Medicine

## 2014-07-03 ENCOUNTER — Ambulatory Visit (INDEPENDENT_AMBULATORY_CARE_PROVIDER_SITE_OTHER): Payer: Medicare Other

## 2014-07-03 DIAGNOSIS — Z23 Encounter for immunization: Secondary | ICD-10-CM

## 2014-07-05 NOTE — Telephone Encounter (Signed)
Last seen 11/04/13 B [place]  Last thyroid level 05/04/13

## 2014-07-05 NOTE — Telephone Encounter (Signed)
Last seen 11/04/13 B [place]  Last lipid 05/04/13

## 2014-07-30 ENCOUNTER — Other Ambulatory Visit: Payer: Self-pay | Admitting: Family Medicine

## 2014-08-02 ENCOUNTER — Other Ambulatory Visit (INDEPENDENT_AMBULATORY_CARE_PROVIDER_SITE_OTHER): Payer: Medicare Other

## 2014-08-02 DIAGNOSIS — I10 Essential (primary) hypertension: Secondary | ICD-10-CM | POA: Diagnosis not present

## 2014-08-02 NOTE — Addendum Note (Signed)
Addended by: Orma RenderHODGES, Wynetta Seith F on: 08/02/2014 05:35 PM   Modules accepted: Orders

## 2014-08-03 LAB — LIPID PANEL
Chol/HDL Ratio: 3.5 ratio units (ref 0.0–5.0)
Cholesterol, Total: 118 mg/dL (ref 100–199)
HDL: 34 mg/dL — ABNORMAL LOW (ref >39)
LDL Calculated: 57 mg/dL (ref 0–99)
Triglycerides: 135 mg/dL (ref 0–149)
VLDL Cholesterol Cal: 27 mg/dL (ref 5–40)

## 2014-08-03 LAB — CBC WITH DIFFERENTIAL
Basophils Absolute: 0 10*3/uL (ref 0.0–0.2)
Basos: 0 %
Eos: 2 %
Eosinophils Absolute: 0.1 10*3/uL (ref 0.0–0.4)
HCT: 46.6 % (ref 37.5–51.0)
Hemoglobin: 16 g/dL (ref 12.6–17.7)
Immature Grans (Abs): 0 10*3/uL (ref 0.0–0.1)
Immature Granulocytes: 0 %
Lymphocytes Absolute: 2.1 10*3/uL (ref 0.7–3.1)
Lymphs: 23 %
MCH: 29.4 pg (ref 26.6–33.0)
MCHC: 34.3 g/dL (ref 31.5–35.7)
MCV: 86 fL (ref 79–97)
Monocytes Absolute: 1 10*3/uL — ABNORMAL HIGH (ref 0.1–0.9)
Monocytes: 11 %
Neutrophils Absolute: 5.7 10*3/uL (ref 1.4–7.0)
Neutrophils Relative %: 64 %
Platelets: 171 10*3/uL (ref 150–379)
RBC: 5.45 x10E6/uL (ref 4.14–5.80)
RDW: 14 % (ref 12.3–15.4)
WBC: 9.1 10*3/uL (ref 3.4–10.8)

## 2014-08-03 LAB — CMP14+EGFR
ALT: 18 IU/L (ref 0–44)
AST: 18 IU/L (ref 0–40)
Albumin/Globulin Ratio: 1.6 (ref 1.1–2.5)
Albumin: 4.5 g/dL (ref 3.5–4.8)
Alkaline Phosphatase: 72 IU/L (ref 39–117)
BUN/Creatinine Ratio: 13 (ref 10–22)
BUN: 23 mg/dL (ref 8–27)
CO2: 24 mmol/L (ref 18–29)
Calcium: 9.9 mg/dL (ref 8.6–10.2)
Chloride: 101 mmol/L (ref 97–108)
Creatinine, Ser: 1.71 mg/dL — ABNORMAL HIGH (ref 0.76–1.27)
GFR calc Af Amer: 46 mL/min/{1.73_m2} — ABNORMAL LOW (ref >59)
GFR calc non Af Amer: 39 mL/min/{1.73_m2} — ABNORMAL LOW (ref >59)
Globulin, Total: 2.8 g/dL (ref 1.5–4.5)
Glucose: 107 mg/dL — ABNORMAL HIGH (ref 65–99)
Potassium: 4.8 mmol/L (ref 3.5–5.2)
Sodium: 143 mmol/L (ref 134–144)
Total Bilirubin: 0.4 mg/dL (ref 0.0–1.2)
Total Protein: 7.3 g/dL (ref 6.0–8.5)

## 2014-08-09 ENCOUNTER — Ambulatory Visit (INDEPENDENT_AMBULATORY_CARE_PROVIDER_SITE_OTHER): Payer: Medicare Other | Admitting: Family Medicine

## 2014-08-09 ENCOUNTER — Encounter: Payer: Self-pay | Admitting: Family Medicine

## 2014-08-09 VITALS — BP 113/60 | HR 57 | Temp 97.3°F | Ht 69.0 in | Wt 195.3 lb

## 2014-08-09 DIAGNOSIS — R252 Cramp and spasm: Secondary | ICD-10-CM | POA: Diagnosis not present

## 2014-08-09 DIAGNOSIS — I10 Essential (primary) hypertension: Secondary | ICD-10-CM

## 2014-08-09 DIAGNOSIS — E785 Hyperlipidemia, unspecified: Secondary | ICD-10-CM

## 2014-08-09 NOTE — Progress Notes (Signed)
   Subjective:    Patient ID: Kevin Flores, male    DOB: 12/03/1940, 73 y.o.   MRN: [id number]  HPI Patient is here with c/o hs cramps.  He has hx of hypothyroidism, HTN, hyperlipidemia, and OA.  He is taking crestor 20mg  po qd.  He states he has heard how crestor causes myalgias and crampls and was wanting to stop this to see if his HS cramps subside. He has had recent labs and show CKD which is stable otherwise labs look good.  Review of Systems  Constitutional: Negative for fever.  HENT: Negative for ear pain.   Eyes: Negative for discharge.  Respiratory: Negative for cough.   Cardiovascular: Negative for chest pain.  Gastrointestinal: Negative for abdominal distention.  Endocrine: Negative for polyuria.  Genitourinary: Negative for difficulty urinating.  Musculoskeletal: Negative for gait problem and neck pain.  Skin: Negative for color change and rash.  Neurological: Negative for speech difficulty and headaches.  Psychiatric/Behavioral: Negative for agitation.       Objective:    BP 113/60 mmHg  Pulse 57  Temp(Src) 97.3 F (36.3 C) (Oral)  Ht 5\' 9"  (1.753 m)  Wt 195 lb 4.8 oz (88.587 kg)  BMI 28.83 kg/m2 Physical Exam  Constitutional: He is oriented to person, place, and time. He appears well-developed and well-nourished.  HENT:  Head: Normocephalic and atraumatic.  Mouth/Throat: Oropharynx is clear and moist.  Eyes: Pupils are equal, round, and reactive to light.  Neck: Normal range of motion. Neck supple.  Cardiovascular: Normal rate and regular rhythm.   No murmur heard. Pulmonary/Chest: Effort normal and breath sounds normal.  Abdominal: Soft. Bowel sounds are normal. There is no tenderness.  Neurological: He is alert and oriented to person, place, and time.  Skin: Skin is warm and dry.  Psychiatric: He has a normal mood and affect.          Assessment & Plan:     ICD-9-CM ICD-10-CM   1. Essential hypertension 401.9 I10   2. Cramps of lower extremity,  unspecified laterality 729.82 R25.2   3. Hyperlipidemia 272.4 E78.5      Return in about 3 months (around 11/08/2014).  Deatra CanterWilliam J Sarra Rachels FNP

## 2014-09-06 ENCOUNTER — Other Ambulatory Visit: Payer: Self-pay | Admitting: Family Medicine

## 2014-10-18 ENCOUNTER — Encounter: Payer: Self-pay | Admitting: Family Medicine

## 2014-10-18 ENCOUNTER — Ambulatory Visit (INDEPENDENT_AMBULATORY_CARE_PROVIDER_SITE_OTHER): Payer: Medicare Other | Admitting: Family Medicine

## 2014-10-18 VITALS — BP 108/60 | HR 55 | Temp 97.1°F | Ht 69.0 in | Wt 199.4 lb

## 2014-10-18 DIAGNOSIS — J069 Acute upper respiratory infection, unspecified: Secondary | ICD-10-CM

## 2014-10-18 MED ORDER — BENZONATATE 100 MG PO CAPS: 100.0000 mg | ORAL_CAPSULE | Freq: Three times a day (TID) | ORAL | Status: DC | PRN

## 2014-10-18 MED ORDER — AZITHROMYCIN 250 MG PO TABS: ORAL_TABLET | ORAL | Status: DC

## 2014-10-18 NOTE — Progress Notes (Signed)
   Subjective:    Patient ID: Kevin DrownJohnny Guastella, male    DOB: 05/29/1941, 74 y.o.   MRN: [id number]  HPI Patient is here for c/o cough and uri sx's.  Review of Systems  Constitutional: Negative for fever.  HENT: Negative for ear pain.   Eyes: Negative for discharge.  Respiratory: Negative for cough.   Cardiovascular: Negative for chest pain.  Gastrointestinal: Negative for abdominal distention.  Endocrine: Negative for polyuria.  Genitourinary: Negative for difficulty urinating.  Musculoskeletal: Negative for gait problem and neck pain.  Skin: Negative for color change and rash.  Neurological: Negative for speech difficulty and headaches.  Psychiatric/Behavioral: Negative for agitation.       Objective:    BP 108/60 mmHg  Pulse 55  Temp(Src) 97.1 F (36.2 C) (Oral)  Ht 5\' 9"  (1.753 m)  Wt 199 lb 6 oz (90.436 kg)  BMI 29.43 kg/m2 Physical Exam  Constitutional: He is oriented to person, place, and time. He appears well-developed and well-nourished.  HENT:  Head: Normocephalic and atraumatic.  Mouth/Throat: Oropharynx is clear and moist.  Eyes: Pupils are equal, round, and reactive to light.  Neck: Normal range of motion. Neck supple.  Cardiovascular: Normal rate and regular rhythm.   No murmur heard. Pulmonary/Chest: Effort normal and breath sounds normal.  Abdominal: Soft. Bowel sounds are normal. There is no tenderness.  Neurological: He is alert and oriented to person, place, and time.  Skin: Skin is warm and dry.  Psychiatric: He has a normal mood and affect.          Assessment & Plan:     ICD-9-CM ICD-10-CM   1. URI (upper respiratory infection) 465.9 J06.9 azithromycin (ZITHROMAX) 250 MG tablet     benzonatate (TESSALON PERLES) 100 MG capsule     No Follow-up on file.  Deatra CanterWilliam J Laresa Oshiro FNP

## 2014-11-03 ENCOUNTER — Other Ambulatory Visit: Payer: Self-pay | Admitting: Family Medicine

## 2014-11-04 ENCOUNTER — Other Ambulatory Visit: Payer: Self-pay | Admitting: Family Medicine

## 2014-12-27 ENCOUNTER — Other Ambulatory Visit: Payer: Self-pay | Admitting: Family Medicine

## 2014-12-27 NOTE — Telephone Encounter (Signed)
Last seen 10/18/14 B [place]  Last thyroid level 05/04/13

## 2014-12-30 ENCOUNTER — Other Ambulatory Visit: Payer: Self-pay | Admitting: Family Medicine

## 2015-01-03 DIAGNOSIS — N402 Nodular prostate without lower urinary tract symptoms: Secondary | ICD-10-CM | POA: Diagnosis not present

## 2015-01-03 DIAGNOSIS — N200 Calculus of kidney: Secondary | ICD-10-CM | POA: Diagnosis not present

## 2015-01-03 DIAGNOSIS — N281 Cyst of kidney, acquired: Secondary | ICD-10-CM | POA: Diagnosis not present

## 2015-01-28 ENCOUNTER — Other Ambulatory Visit: Payer: Self-pay | Admitting: Family Medicine

## 2015-02-25 ENCOUNTER — Other Ambulatory Visit: Payer: Self-pay | Admitting: Nurse Practitioner

## 2015-02-25 NOTE — Telephone Encounter (Signed)
Last seen 10/18/14 B [place]  Last thyroid 05/04/13

## 2015-03-07 ENCOUNTER — Other Ambulatory Visit: Payer: Self-pay

## 2015-03-26 ENCOUNTER — Other Ambulatory Visit: Payer: Self-pay | Admitting: Family Medicine

## 2015-03-28 NOTE — Telephone Encounter (Signed)
no more refills without being seen  

## 2015-03-28 NOTE — Telephone Encounter (Signed)
Last seen 10/18/14 B [place]  No upcoming appt scheduled

## 2015-03-29 ENCOUNTER — Other Ambulatory Visit: Payer: Self-pay | Admitting: Family Medicine

## 2015-03-29 ENCOUNTER — Other Ambulatory Visit: Payer: Self-pay

## 2015-03-29 MED ORDER — LEVOTHYROXINE SODIUM 50 MCG PO TABS: ORAL_TABLET | ORAL | Status: DC

## 2015-03-29 NOTE — Telephone Encounter (Signed)
Last seen 10/18/14 B [place]  Last lipid 11/15 and last thyroid 05/04/13

## 2015-03-29 NOTE — Telephone Encounter (Signed)
Refilled levothyroxin but deny crestor- Patient NTBS for follow up and lab work

## 2015-03-29 NOTE — Telephone Encounter (Signed)
crestor rx declined- Patient NTBS for follow up and lab work

## 2015-03-29 NOTE — Telephone Encounter (Signed)
Last seen 10/18/14 B [place]  Last lipid 08/02/14

## 2015-04-01 ENCOUNTER — Other Ambulatory Visit: Payer: Self-pay | Admitting: Family Medicine

## 2015-04-02 ENCOUNTER — Other Ambulatory Visit: Payer: Self-pay | Admitting: Family Medicine

## 2015-04-02 ENCOUNTER — Other Ambulatory Visit: Payer: Self-pay | Admitting: Nurse Practitioner

## 2015-04-04 ENCOUNTER — Other Ambulatory Visit: Payer: Self-pay | Admitting: Family Medicine

## 2015-04-06 ENCOUNTER — Ambulatory Visit (INDEPENDENT_AMBULATORY_CARE_PROVIDER_SITE_OTHER): Payer: Medicare Other | Admitting: Family

## 2015-04-06 ENCOUNTER — Encounter: Payer: Self-pay | Admitting: Family

## 2015-04-06 VITALS — BP 133/76 | HR 64 | Temp 97.4°F | Ht 69.0 in | Wt 199.8 lb

## 2015-04-06 DIAGNOSIS — I2510 Atherosclerotic heart disease of native coronary artery without angina pectoris: Secondary | ICD-10-CM | POA: Diagnosis not present

## 2015-04-06 DIAGNOSIS — J069 Acute upper respiratory infection, unspecified: Secondary | ICD-10-CM | POA: Diagnosis not present

## 2015-04-06 DIAGNOSIS — E559 Vitamin D deficiency, unspecified: Secondary | ICD-10-CM

## 2015-04-06 DIAGNOSIS — I10 Essential (primary) hypertension: Secondary | ICD-10-CM | POA: Diagnosis not present

## 2015-04-06 DIAGNOSIS — Z23 Encounter for immunization: Secondary | ICD-10-CM | POA: Diagnosis not present

## 2015-04-06 DIAGNOSIS — E039 Hypothyroidism, unspecified: Secondary | ICD-10-CM

## 2015-04-06 DIAGNOSIS — E785 Hyperlipidemia, unspecified: Secondary | ICD-10-CM

## 2015-04-06 MED ORDER — BENZONATATE 200 MG PO CAPS: 200.0000 mg | ORAL_CAPSULE | Freq: Three times a day (TID) | ORAL | Status: DC | PRN

## 2015-04-06 NOTE — Progress Notes (Signed)
Subjective:    Patient ID: Kevin Flores, male    DOB: 02/14/1941, 74 y.o.   MRN: [id number]  Pt presents to the office today for chronic follow up.  Hypertension This is a chronic problem. The current episode started more than 1 year ago. The problem has been resolved since onset. The problem is controlled. Pertinent negatives include no anxiety, headaches, palpitations or peripheral edema. Risk factors for coronary artery disease include dyslipidemia, male gender, sedentary lifestyle and family history. Past treatments include ACE inhibitors, diuretics and calcium channel blockers. The current treatment provides significant improvement. Hypertensive end-organ damage includes a thyroid problem. There is no history of kidney disease, CVA or heart failure.  Hyperlipidemia This is a chronic problem. The current episode started more than 1 year ago. The problem is controlled. Recent lipid tests were reviewed and are normal. He has no history of diabetes. Pertinent negatives include no leg pain. Current antihyperlipidemic treatment includes statins. The current treatment provides significant improvement of lipids. Risk factors for coronary artery disease include dyslipidemia, family history, hypertension, male sex and a sedentary lifestyle.  Thyroid Problem Presents for follow-up visit. Patient reports no anxiety, constipation, diarrhea, fatigue or palpitations. The symptoms have been stable. Past treatments include levothyroxine. The treatment provided moderate relief. His past medical history is significant for hyperlipidemia. There is no history of diabetes or heart failure.  Cough This is a new problem. The current episode started 1 to 4 weeks ago. The problem has been waxing and waning. The problem occurs every few minutes. The cough is non-productive. Associated symptoms include postnasal drip. Pertinent negatives include no chills, ear congestion or headaches. The symptoms are aggravated by pollens  and lying down. He has tried rest and ipratropium inhaler for the symptoms. The treatment provided mild relief.      Review of Systems  Constitutional: Negative.  Negative for chills and fatigue.  HENT: Positive for postnasal drip.   Respiratory: Positive for cough.   Cardiovascular: Negative.  Negative for palpitations.  Gastrointestinal: Negative.  Negative for diarrhea and constipation.  Endocrine: Negative.   Genitourinary: Negative.   Musculoskeletal: Negative.   Neurological: Negative.  Negative for headaches.  Hematological: Negative.   Psychiatric/Behavioral: Negative.  The patient is not nervous/anxious.   All other systems reviewed and are negative.      Objective:   Physical Exam  Constitutional: He is oriented to person, place, and time. He appears well-developed and well-nourished. No distress.  HENT:  Head: Normocephalic.  Right Ear: External ear normal.  Left Ear: External ear normal.  Nose: Nose normal.  Mouth/Throat: Oropharynx is clear and moist.  Eyes: Pupils are equal, round, and reactive to light. Right eye exhibits no discharge. Left eye exhibits no discharge.  Neck: Normal range of motion. Neck supple. No thyromegaly present.  Cardiovascular: Normal rate, regular rhythm, normal heart sounds and intact distal pulses.   No murmur heard. Pulmonary/Chest: Effort normal and breath sounds normal. No respiratory distress. He has no wheezes.  Abdominal: Soft. Bowel sounds are normal. He exhibits no distension. There is no tenderness.  Musculoskeletal: Normal range of motion. He exhibits no edema or tenderness.  Neurological: He is alert and oriented to person, place, and time. He has normal reflexes. No cranial nerve deficit.  Skin: Skin is warm and dry. No rash noted. No erythema.  Psychiatric: He has a normal mood and affect. His behavior is normal. Judgment and thought content normal.  Vitals reviewed.   BP 133/76 mmHg  Pulse 64  Temp(Src) 97.4 F (36.3  C) (Oral)  Ht _0  (1.753 m)  Wt 199 lb 12.8 oz (90.629 kg)  BMI 29.49 kg/m2       Assessment & Plan:  1. Essential hypertension - CMP14+EGFR  2. Atherosclerosis of native coronary artery of native heart without angina pectoris - CMP14+EGFR - Lipid panel  3. Hyperlipidemia - CMP14+EGFR - Lipid panel  4. Vitamin D deficiency - CMP14+EGFR - Vit D  25 hydroxy (rtn osteoporosis monitoring)  5. Hypothyroidism, unspecified hypothyroidism type - CMP14+EGFR - Thyroid Panel With TSH  6. Acute upper respiratory infection -- Take meds as prescribed - Use a cool mist humidifier  -Use saline nose sprays frequently -Saline irrigations of the nose can be very helpful if done frequently.  * 4X daily for 1 week*  * Use of a nettie pot can be helpful with this. Follow directions with this* -Force fluids -For any cough or congestion  Use plain Mucinex- regular strength or max strength is fine   * Children- consult with Pharmacist for dosing -For fever or aces or pains- take tylenol or ibuprofen appropriate for age and weight.  * for fevers greater than 101 orally you may alternate ibuprofen and tylenol every  3 hours. -Throat lozenges if help - benzonatate (TESSALON) 200 MG capsule; Take 1 capsule (200 mg total) by mouth 3 (three) times daily as needed.  Dispense: 30 capsule; Refill: 1   Continue all meds Labs pending Health Maintenance reviewed Diet and exercise encouraged RTO 6 months  Evelina Dun, FNP

## 2015-04-06 NOTE — Patient Instructions (Addendum)

## 2015-04-07 LAB — CMP14+EGFR
ALT: 27 IU/L (ref 0–44)
AST: 29 IU/L (ref 0–40)
Albumin/Globulin Ratio: 1.6 (ref 1.1–2.5)
Albumin: 4.5 g/dL (ref 3.5–4.8)
Alkaline Phosphatase: 69 IU/L (ref 39–117)
BILIRUBIN TOTAL: 0.4 mg/dL (ref 0.0–1.2)
BUN/Creatinine Ratio: 14 (ref 10–22)
BUN: 23 mg/dL (ref 8–27)
CO2: 22 mmol/L (ref 18–29)
Calcium: 9.7 mg/dL (ref 8.6–10.2)
Chloride: 101 mmol/L (ref 97–108)
Creatinine, Ser: 1.65 mg/dL — ABNORMAL HIGH (ref 0.76–1.27)
GFR, EST AFRICAN AMERICAN: 48 mL/min/{1.73_m2} — AB (ref >59)
GFR, EST NON AFRICAN AMERICAN: 41 mL/min/{1.73_m2} — AB (ref >59)
Globulin, Total: 2.9 g/dL (ref 1.5–4.5)
Glucose: 91 mg/dL (ref 65–99)
POTASSIUM: 4.7 mmol/L (ref 3.5–5.2)
Sodium: 140 mmol/L (ref 134–144)
TOTAL PROTEIN: 7.4 g/dL (ref 6.0–8.5)

## 2015-04-07 LAB — VITAMIN D 25 HYDROXY (VIT D DEFICIENCY, FRACTURES): Vit D, 25-Hydroxy: 42.8 ng/mL (ref 30.0–100.0)

## 2015-04-07 LAB — LIPID PANEL
Chol/HDL Ratio: 3.7 ratio units (ref 0.0–5.0)
Cholesterol, Total: 118 mg/dL (ref 100–199)
HDL: 32 mg/dL — AB (ref >39)
LDL Calculated: 42 mg/dL (ref 0–99)
TRIGLYCERIDES: 221 mg/dL — AB (ref 0–149)
VLDL Cholesterol Cal: 44 mg/dL — ABNORMAL HIGH (ref 5–40)

## 2015-04-07 LAB — THYROID PANEL WITH TSH
Free Thyroxine Index: 2.4 (ref 1.2–4.9)
T3 UPTAKE RATIO: 27 % (ref 24–39)
T4 TOTAL: 8.8 ug/dL (ref 4.5–12.0)
TSH: 2.81 u[IU]/mL (ref 0.450–4.500)

## 2015-04-20 ENCOUNTER — Other Ambulatory Visit: Payer: Self-pay | Admitting: *Deleted

## 2015-04-20 MED ORDER — AMLODIPINE BESYLATE 10 MG PO TABS: ORAL_TABLET | ORAL | Status: DC

## 2015-04-20 MED ORDER — METOPROLOL SUCCINATE ER 50 MG PO TB24: ORAL_TABLET | ORAL | Status: DC

## 2015-04-25 ENCOUNTER — Ambulatory Visit (INDEPENDENT_AMBULATORY_CARE_PROVIDER_SITE_OTHER): Payer: Medicare Other | Admitting: Family Medicine

## 2015-04-25 ENCOUNTER — Encounter: Payer: Self-pay | Admitting: Family Medicine

## 2015-04-25 ENCOUNTER — Ambulatory Visit (INDEPENDENT_AMBULATORY_CARE_PROVIDER_SITE_OTHER): Payer: Medicare Other

## 2015-04-25 VITALS — BP 140/67 | HR 54 | Temp 97.3°F | Ht 69.0 in | Wt 198.4 lb

## 2015-04-25 DIAGNOSIS — I2510 Atherosclerotic heart disease of native coronary artery without angina pectoris: Secondary | ICD-10-CM | POA: Diagnosis not present

## 2015-04-25 DIAGNOSIS — M2540 Effusion, unspecified joint: Secondary | ICD-10-CM

## 2015-04-25 MED ORDER — PREDNISONE 20 MG PO TABS: ORAL_TABLET | ORAL | Status: DC

## 2015-04-25 NOTE — Progress Notes (Addendum)
Patient ID: Kevin Flores, male   DOB: [birth year], 74 y.o.   MRN: [id number]   HPI  Patient presents today for same-day appointment for swollen joint in his finger  Patient explains that about 5 days ago he had rapid onset right finger swelling. He had redness tenderness and swelling of the DIP of the second digit.  He denies fever, chills, sweats. He is a history of gout in the right knee He's been using [place] dream cream with some improvement He has nodules on about 8 as 10 fingers and states that they've been there for several years.  No chest pain, no dyspnea, no palpitations, no leg edema Has history of CABG 2  PMH: Smoking status noted ROS: Per HPI  Objective: BP 140/67 mmHg  Pulse 54  Temp(Src) 97.3 F (36.3 C) (Oral)  Ht  (1.753 m)  Wt 198 lb 6.4 oz (89.994 kg)  BMI 29.29 kg/m2 Gen: NAD, alert, cooperative with exam HEENT: NCAT CV: RRR, good S1/S2, no murmur Resp: CTABL, no wheezes, non-labored Ext: No edema, warm Neuro: Alert and oriented, No gross deficits MSK: DIP of right second digit swollen warm, tender, and red, he has nodules which are firm on his third and fourth digits of his right hand as well as his first through fourth digits DIPs of the left hand as well. None of the others are inflamed or erythematous.  X-ray of right hand 04/25/2015: DIP joints with osteophyte formation consistent with OA, no obvious joint erosion -  reviewed by myself today  Assessment and plan:  Joint swelling Concerning for gout flare, consider colchicine however decided to use prednisone instead with creatinine clearance of 24 Nodules on bilateral hands at the DIPs which could be heberdens nodules of OA but could also represent tophus Hx of R knee gout Follow up 1-2 months to check Uric acid and discuss allopurinol Plain film to see if I can distinguish what nodule is- inflammatory arthritis vs OA - on review c/w longstanding OA and acute gout rather than longstanding  uncontrolled inflammatory arthritis.     Orders Placed This Encounter  Procedures  . DG Hand Complete Right    Standing Status: Future     Number of Occurrences: 1     Standing Expiration Date: 06/24/2016    Order Specific Question:  Reason for Exam (SYMPTOM  OR DIAGNOSIS REQUIRED)    Answer:  DIP nodules, eval for inflammatory vs OA    Order Specific Question:  Preferred imaging location?    Answer:  Internal    Meds ordered this encounter  Medications  . predniSONE (DELTASONE) 20 MG tablet    Sig: Take 2 tabs daily for 4 days, then take 1 tab daily for 4 days, then take one half tab daily 4 days then stop.    Dispense:  14 tablet    Refill:  0   Murtis Sink, MD [facility] 04/25/2015, 5:38 PM

## 2015-04-25 NOTE — Assessment & Plan Note (Addendum)
Concerning for gout flare, consider colchicine however decided to use prednisone instead with creatinine clearance of 24 Nodules on bilateral hands at the DIPs which could be heberdens nodules of OA but could also represent tophus Hx of R knee gout Follow up 1-2 months to check Uric acid and discuss allopurinol Plain film to see if I can distinguish what nodule is- inflammatory arthritis vs OA - on review c/w longstanding OA and acute gout rather than longstanding uncontrolled inflammatory arthritis.

## 2015-04-25 NOTE — Patient Instructions (Addendum)
Great to meet you!  Prednisone should help quite a bit, take it with food.   If you are not improving within 2 days come back.   Come back in 1-2 months to discuss gout at more length

## 2015-04-28 ENCOUNTER — Other Ambulatory Visit: Payer: Self-pay | Admitting: Family Medicine

## 2015-04-28 ENCOUNTER — Other Ambulatory Visit: Payer: Self-pay | Admitting: Nurse Practitioner

## 2015-05-23 ENCOUNTER — Ambulatory Visit (INDEPENDENT_AMBULATORY_CARE_PROVIDER_SITE_OTHER): Payer: Medicare Other | Admitting: Family Medicine

## 2015-05-23 ENCOUNTER — Encounter: Payer: Self-pay | Admitting: Family Medicine

## 2015-05-23 ENCOUNTER — Telehealth: Payer: Self-pay | Admitting: Family Medicine

## 2015-05-23 VITALS — BP 134/75 | HR 63 | Temp 97.5°F | Ht 69.0 in | Wt 197.8 lb

## 2015-05-23 DIAGNOSIS — I2510 Atherosclerotic heart disease of native coronary artery without angina pectoris: Secondary | ICD-10-CM

## 2015-05-23 DIAGNOSIS — R12 Heartburn: Secondary | ICD-10-CM | POA: Diagnosis not present

## 2015-05-23 DIAGNOSIS — R066 Hiccough: Secondary | ICD-10-CM

## 2015-05-23 MED ORDER — ESOMEPRAZOLE MAGNESIUM 40 MG PO CPDR: 40.0000 mg | DELAYED_RELEASE_CAPSULE | Freq: Every day | ORAL | Status: DC

## 2015-05-23 MED ORDER — CHLORPROMAZINE HCL 10 MG PO TABS: 10.0000 mg | ORAL_TABLET | Freq: Four times a day (QID) | ORAL | Status: DC

## 2015-05-23 NOTE — Patient Instructions (Signed)
Give hiccup's 2-3 days with this treatment. If not better will need to do a chest x-ray and look for the cause.

## 2015-05-23 NOTE — Progress Notes (Signed)
Subjective:  Patient ID: Kevin Flores, male    DOB: [birth year]  Age: 74 y.o. MRN: [id number]  CC: Hiccups   HPI Kevin Flores presents for insidious increase in hiccups over the last 10 days. They've been embarrassing and kept him awake at night they're causing him to have abdominal discomfort and heartburn. He says that he had 30 years ago been diagnosed with a esophageal diverticulum. He thinks that may be causing the heartburn. He has tried various home remedies including vinegar, putting a tablespoon of sugar on his tongue. Holding his breath multiple times. Nothing seems to have given him relief. He has not been exposed to any new substances. He has had no injuries. He denies any new medications.  History Deitrick has a past medical history of Allergy; Arthritis; Hyperlipidemia; Hypertension; Thyroid disease; Personal history of colonic polyps; CAD (coronary artery disease); Gout; Nephrolithiasis; and Heartburn.   He has past surgical history that includes Coronary artery bypass graft (1998); cardiac stents (2001   and 40981); Colonoscopy w/ polypectomy (2009); Upper gastrointestinal endoscopy; Biopsy prostate; and cysto with calculus manipulation.   His family history includes Colon cancer in his brother.He reports that he quit smoking about 43 years ago. He has never used smokeless tobacco. He reports that he does not drink alcohol or use illicit drugs.  Outpatient Prescriptions Prior to Visit  Medication Sig Dispense Refill  . albuterol (PROVENTIL HFA;VENTOLIN HFA) 108 (90 BASE) MCG/ACT inhaler Inhale 2 puffs into the lungs every 6 (six) hours as needed for wheezing or shortness of breath. 1 Inhaler 3  . amLODipine (NORVASC) 10 MG tablet TAKE 1 TABLET (10 MG TOTAL) BY MOUTH DAILY. 30 tablet 5  . aspirin 325 MG tablet Take 325 mg by mouth daily.      . benazepril-hydrochlorthiazide (LOTENSIN HCT) 10-12.5 MG per tablet TAKE 1 TABLET BY MOUTH DAILY. 30 tablet 2  . cholecalciferol (VITAMIN  D) 1000 UNITS tablet Take 1,000 Units by mouth daily.      . fish oil-omega-3 fatty acids 1000 MG capsule Take 1 g by mouth daily.      Marland Kitchen levothyroxine (SYNTHROID, LEVOTHROID) 50 MCG tablet TAKE 1 TABLET (50 MCG TOTAL) BY MOUTH EVERY MORNING. 30 tablet 10  . metoprolol succinate (TOPROL-XL) 50 MG 24 hr tablet TAKE 1 TABLET (50 MG TOTAL) BY MOUTH EVERY MORNING. 30 tablet 5  . rosuvastatin (CRESTOR) 20 MG tablet TAKE 1 TABLET BY MOUTH EVERY DAY 30 tablet 5  . vitamin C (ASCORBIC ACID) 500 MG tablet Take 500 mg by mouth daily.    . benzonatate (TESSALON) 200 MG capsule Take 1 capsule (200 mg total) by mouth 3 (three) times daily as needed. (Patient not taking: Reported on 05/23/2015) 30 capsule 1  . predniSONE (DELTASONE) 20 MG tablet Take 2 tabs daily for 4 days, then take 1 tab daily for 4 days, then take one half tab daily 4 days then stop. (Patient not taking: Reported on 05/23/2015) 14 tablet 0   Facility-Administered Medications Prior to Visit  Medication Dose Route Frequency Provider Last Rate Last Dose  . 0.9 %  sodium chloride infusion  500 mL Intravenous Continuous Iva Boop, MD        ROS Review of Systems  Constitutional: Negative for fever, chills and diaphoresis.  HENT: Negative for congestion, rhinorrhea and sore throat.   Respiratory: Negative for cough, shortness of breath and wheezing.   Cardiovascular: Negative for chest pain.  Gastrointestinal: Negative for nausea, vomiting, abdominal pain, diarrhea, constipation and  abdominal distention.  Genitourinary: Negative for dysuria and frequency.  Musculoskeletal: Negative for joint swelling and arthralgias.  Skin: Negative for rash.  Neurological: Negative for headaches.    Objective:  BP 134/75 mmHg  Pulse 63  Temp(Src) 97.5 F (36.4 C) (Oral)  Ht 5\' 9"  (1.753 m)  Wt 197 lb 12.8 oz (89.721 kg)  BMI 29.20 kg/m2  BP Readings from Last 3 Encounters:  05/23/15 134/75  04/25/15 140/67  04/06/15 133/76    Wt  Readings from Last 3 Encounters:  05/23/15 197 lb 12.8 oz (89.721 kg)  04/25/15 198 lb 6.4 oz (89.994 kg)  04/06/15 199 lb 12.8 oz (90.629 kg)     Physical Exam  Constitutional: He appears well-developed and well-nourished.  HENT:  Head: Normocephalic and atraumatic.  Right Ear: Tympanic membrane and external ear normal. No decreased hearing is noted.  Left Ear: Tympanic membrane and external ear normal. No decreased hearing is noted.  Mouth/Throat: No oropharyngeal exudate or posterior oropharyngeal erythema.  Eyes: Pupils are equal, round, and reactive to light.  Neck: Normal range of motion. Neck supple.  Cardiovascular: Normal rate and regular rhythm.   No murmur heard. Pulmonary/Chest: Breath sounds normal. No respiratory distress.  Abdominal: Soft. Bowel sounds are normal. He exhibits no mass. There is no tenderness.  Vitals reviewed.   No results found for: HGBA1C  Lab Results  Component Value Date   WBC 9.1 08/02/2014   HGB 16.0 08/02/2014   HCT 46.6 08/02/2014   PLT 171 08/02/2014   GLUCOSE 91 04/06/2015   CHOL 118 04/06/2015   TRIG 221* 04/06/2015   HDL 32* 04/06/2015   LDLCALC 42 04/06/2015   ALT 27 04/06/2015   AST 29 04/06/2015   NA 140 04/06/2015   K 4.7 04/06/2015   CL 101 04/06/2015   CREATININE 1.65* 04/06/2015   BUN 23 04/06/2015   CO2 22 04/06/2015   TSH 2.810 04/06/2015   INR 0.96 09/05/2012    Ct Aspiration  09/06/2012   *RADIOLOGY REPORT*  CT GUIDED ASPIRATION  Date: 09/05/2012  Clinical History: 74 year old male with a history of multiple bilateral renal cysts and chronic left flank pain.  He has a dominant 7.5 cm left renal cyst which may be contributing to his underlying flank pain.  Today we will perform a diagnostic and potentially therapeutic renal cyst aspiration of this dominant left renal cyst.  Procedures Performed: 1. CT guided aspiration of dominant left renal cyst 2.  CT-guided aspiration of the second adjacent cyst  Interventional  Radiologist:  Sterling Big, MD  Sedation: Moderate (conscious) sedation was used.  Two mg Versed, 100 mcg Fentanyl were administered intravenously.  The patient's vital signs were monitored continuously by radiology nursing throughout the procedure.  Sedation Time: 19 minutes  PROCEDURE/FINDINGS:   Informed consent was obtained from the patient following explanation of the procedure, risks, benefits and alternatives. The patient understands, agrees and consents for the procedure. All questions were addressed. A time out was performed.  Maximal barrier sterile technique utilized including caps, mask, sterile gowns, sterile gloves, large sterile drape, hand hygiene, and betadine skin prep.  The patient was placed in the prone position and a planning axial CT scan was performed.  The dominant left renal cyst was successfully identified.  An appropriate skin entry site was selected marked.  Local anesthesia was obtained by infiltration of 1% lidocaine.  Under CT fluoroscopic guidance a 15 cm 7-French Yueh catheter was advanced into the central aspect of the cyst.  The cyst was then completely aspirated with return of approximately 120 ml of clear yellow serous fluid.  Follow-up axial CT imaging confirmed total aspiration of the cyst.  There is an adjacent 3.5 cm exophytic cyst.  The UV catheter was successfully navigated into this cyst using the same puncture site.  This was also aspirated yielding approximately 20 ml of serous yellow fluid.  The centesis catheter was removed.  The patient tolerated the procedure well, there is no immediate complication.  IMPRESSION:  1.  Successful CT guided aspiration of the dominant 7.5 cm left renal cyst.  2.  Aspiration of a second adjacent smaller renal cyst through the existing puncture site.  3.  This cyst fluid was pooled and sent to pathology for cytologic evaluation.  By imaging, the renal cysts appear simple can be aspirated fluid also appears grossly simple.  Signed,   Sterling Big, MD Vascular & Interventional Radiologist [facility] Radiology   Original Report Authenticated By: Malachy Moan, M.D.    Assessment & Plan:   Dio was seen today for hiccups.  Diagnoses and all orders for this visit:  Intractable hiccups  Heartburn  Other orders -     chlorproMAZINE (THORAZINE) 10 MG tablet; Take 1 tablet (10 mg total) by mouth 4 (four) times daily. Prn hiccups -     esomeprazole (NEXIUM) 40 MG capsule; Take 1 capsule (40 mg total) by mouth daily.   I have discontinued Mr. Bottino benzonatate and predniSONE. I am also having him start on chlorproMAZINE and esomeprazole. Additionally, I am having him maintain his fish oil-omega-3 fatty acids, aspirin, cholecalciferol, vitamin C, albuterol, benazepril-hydrochlorthiazide, amLODipine, metoprolol succinate, rosuvastatin, levothyroxine, and hydrochlorothiazide. We will continue to administer sodium chloride.  Meds ordered this encounter  Medications  . hydrochlorothiazide (MICROZIDE) 12.5 MG capsule    Sig: Take 1 capsule by mouth daily.  . chlorproMAZINE (THORAZINE) 10 MG tablet    Sig: Take 1 tablet (10 mg total) by mouth 4 (four) times daily. Prn hiccups    Dispense:  28 tablet    Refill:  0  . esomeprazole (NEXIUM) 40 MG capsule    Sig: Take 1 capsule (40 mg total) by mouth daily.    Dispense:  30 capsule    Refill:  3    Give hiccup's 2-3 days with this treatment. If not better will need to do a chest x-ray and look for the cause. Follow-up: Return if symptoms worsen or fail to improve.  Mechele Claude, M.D.

## 2015-05-25 ENCOUNTER — Other Ambulatory Visit: Payer: Self-pay | Admitting: Family Medicine

## 2015-06-02 ENCOUNTER — Other Ambulatory Visit: Payer: Self-pay | Admitting: Family Medicine

## 2015-06-27 ENCOUNTER — Ambulatory Visit (INDEPENDENT_AMBULATORY_CARE_PROVIDER_SITE_OTHER): Payer: Medicare Other

## 2015-06-27 DIAGNOSIS — Z23 Encounter for immunization: Secondary | ICD-10-CM

## 2015-10-17 ENCOUNTER — Ambulatory Visit (INDEPENDENT_AMBULATORY_CARE_PROVIDER_SITE_OTHER): Payer: Medicare Other | Admitting: Family

## 2015-10-17 ENCOUNTER — Encounter: Payer: Self-pay | Admitting: Family

## 2015-10-17 VITALS — BP 130/77 | HR 59 | Temp 97.2°F | Ht 69.0 in | Wt 200.0 lb

## 2015-10-17 DIAGNOSIS — K219 Gastro-esophageal reflux disease without esophagitis: Secondary | ICD-10-CM | POA: Diagnosis not present

## 2015-10-17 DIAGNOSIS — I10 Essential (primary) hypertension: Secondary | ICD-10-CM

## 2015-10-17 DIAGNOSIS — E8881 Metabolic syndrome: Secondary | ICD-10-CM | POA: Insufficient documentation

## 2015-10-17 DIAGNOSIS — E559 Vitamin D deficiency, unspecified: Secondary | ICD-10-CM

## 2015-10-17 DIAGNOSIS — I2510 Atherosclerotic heart disease of native coronary artery without angina pectoris: Secondary | ICD-10-CM

## 2015-10-17 DIAGNOSIS — E785 Hyperlipidemia, unspecified: Secondary | ICD-10-CM | POA: Diagnosis not present

## 2015-10-17 DIAGNOSIS — E039 Hypothyroidism, unspecified: Secondary | ICD-10-CM | POA: Diagnosis not present

## 2015-10-17 DIAGNOSIS — R002 Palpitations: Secondary | ICD-10-CM | POA: Diagnosis not present

## 2015-10-17 DIAGNOSIS — M2540 Effusion, unspecified joint: Secondary | ICD-10-CM | POA: Diagnosis not present

## 2015-10-17 MED ORDER — ATORVASTATIN CALCIUM 20 MG PO TABS: 20.0000 mg | ORAL_TABLET | Freq: Every day | ORAL | Status: DC

## 2015-10-17 NOTE — Progress Notes (Signed)
Subjective:    Patient ID: Kevin Flores, male    DOB: 11/07/1940, 75 y.o.   MRN: [id number]  Pt presents to the office today for chronic follow up.  Hypertension This is a chronic problem. The current episode started more than 1 year ago. The problem has been resolved since onset. The problem is controlled. Pertinent negatives include no anxiety, palpitations or peripheral edema. Risk factors for coronary artery disease include dyslipidemia, male gender, sedentary lifestyle and family history. Past treatments include ACE inhibitors, diuretics and calcium channel blockers. The current treatment provides significant improvement. Hypertensive end-organ damage includes a thyroid problem. There is no history of kidney disease, CVA or heart failure.  Hyperlipidemia This is a chronic problem. The current episode started more than 1 year ago. The problem is controlled. Recent lipid tests were reviewed and are normal. He has no history of diabetes. Pertinent negatives include no leg pain. Current antihyperlipidemic treatment includes statins. The current treatment provides significant improvement of lipids. Compliance problems: Pt complaining of muscle cramps with the Crestor- would like meds switched.  Risk factors for coronary artery disease include dyslipidemia, family history, hypertension, male sex and a sedentary lifestyle.  Thyroid Problem Presents for follow-up visit. Patient reports no anxiety, constipation, diarrhea, fatigue or palpitations. The symptoms have been stable. Past treatments include levothyroxine. The treatment provided moderate relief. His past medical history is significant for hyperlipidemia. There is no history of diabetes or heart failure.  Gastroesophageal Reflux He complains of heartburn. He reports no belching or no coughing. This is a chronic problem. The problem occurs rarely. The problem has been waxing and waning. Pertinent negatives include no fatigue. He has tried an antacid  for the symptoms. The treatment provided mild relief.      Review of Systems  Constitutional: Negative.  Negative for fatigue.  Respiratory: Negative for cough.   Cardiovascular: Negative.  Negative for palpitations.  Gastrointestinal: Positive for heartburn. Negative for diarrhea and constipation.  Endocrine: Negative.   Genitourinary: Negative.   Musculoskeletal: Negative.   Neurological: Negative.   Hematological: Negative.   Psychiatric/Behavioral: Negative.  The patient is not nervous/anxious.   All other systems reviewed and are negative.      Objective:   Physical Exam  Constitutional: He is oriented to person, place, and time. He appears well-developed and well-nourished. No distress.  HENT:  Head: Normocephalic.  Right Ear: External ear normal.  Left Ear: External ear normal.  Nose: Nose normal.  Mouth/Throat: Oropharynx is clear and moist.  Eyes: Pupils are equal, round, and reactive to light. Right eye exhibits no discharge. Left eye exhibits no discharge.  Neck: Normal range of motion. Neck supple. No thyromegaly present.  Cardiovascular: Normal rate, regular rhythm, normal heart sounds and intact distal pulses.   No murmur heard. Pulmonary/Chest: Effort normal and breath sounds normal. No respiratory distress. He has no wheezes.  Abdominal: Soft. Bowel sounds are normal. He exhibits no distension. There is no tenderness.  Musculoskeletal: Normal range of motion. He exhibits no edema or tenderness.  Neurological: He is alert and oriented to person, place, and time. He has normal reflexes. No cranial nerve deficit.  Skin: Skin is warm and dry. No rash noted. No erythema.  Psychiatric: He has a normal mood and affect. His behavior is normal. Judgment and thought content normal.  Vitals reviewed.   BP 130/77 mmHg  Pulse 59  Temp(Src) 97.2 F (36.2 C) (Oral)  Ht _0  (1.753 m)  Wt 200 lb (90.719 kg)  BMI 29.52 kg/m2       Assessment & Plan:  1. Essential  hypertension - CMP14+EGFR  2. Atherosclerosis of native coronary artery of native heart without angina pectoris - CMP14+EGFR  3. Hypothyroidism, unspecified hypothyroidism type - CMP14+EGFR - Thyroid Panel With TSH  4. Vitamin D deficiency - CMP14+EGFR  5. Palpitations - CMP14+EGFR  6. Hyperlipidemia -Crestor 20 mg d/c and Lipitor 20 mg ordered today - CMP14+EGFR - Lipid panel - atorvastatin (LIPITOR) 20 MG tablet; Take 1 tablet (20 mg total) by mouth daily.  Dispense: 90 tablet; Refill: 3  7. Joint swelling - CMP14+EGFR  8. Gastroesophageal reflux disease, esophagitis presence not specified - JRP39+SUGA  9. Metabolic syndrome    Continue all meds Labs pending Health Maintenance reviewed Diet and exercise encouraged RTO 6 months   Evelina Dun, FNP

## 2015-10-17 NOTE — Patient Instructions (Signed)

## 2015-10-18 LAB — CMP14+EGFR
ALT: 17 IU/L (ref 0–44)
AST: 23 IU/L (ref 0–40)
Albumin/Globulin Ratio: 1.3 (ref 1.1–2.5)
Albumin: 4.4 g/dL (ref 3.5–4.8)
Alkaline Phosphatase: 77 IU/L (ref 39–117)
BILIRUBIN TOTAL: 0.6 mg/dL (ref 0.0–1.2)
BUN/Creatinine Ratio: 13 (ref 10–22)
BUN: 20 mg/dL (ref 8–27)
CALCIUM: 10 mg/dL (ref 8.6–10.2)
CHLORIDE: 101 mmol/L (ref 96–106)
CO2: 22 mmol/L (ref 18–29)
CREATININE: 1.56 mg/dL — AB (ref 0.76–1.27)
GFR calc non Af Amer: 44 mL/min/{1.73_m2} — ABNORMAL LOW (ref >59)
GFR, EST AFRICAN AMERICAN: 51 mL/min/{1.73_m2} — AB (ref >59)
GLUCOSE: 97 mg/dL (ref 65–99)
Globulin, Total: 3.3 g/dL (ref 1.5–4.5)
Potassium: 4.7 mmol/L (ref 3.5–5.2)
Sodium: 141 mmol/L (ref 134–144)
TOTAL PROTEIN: 7.7 g/dL (ref 6.0–8.5)

## 2015-10-18 LAB — LIPID PANEL
Chol/HDL Ratio: 3.5 ratio units (ref 0.0–5.0)
Cholesterol, Total: 127 mg/dL (ref 100–199)
HDL: 36 mg/dL — AB (ref >39)
LDL Calculated: 66 mg/dL (ref 0–99)
Triglycerides: 125 mg/dL (ref 0–149)
VLDL CHOLESTEROL CAL: 25 mg/dL (ref 5–40)

## 2015-10-18 LAB — THYROID PANEL WITH TSH
Free Thyroxine Index: 2.5 (ref 1.2–4.9)
T3 Uptake Ratio: 26 % (ref 24–39)
T4, Total: 9.6 ug/dL (ref 4.5–12.0)
TSH: 3.91 u[IU]/mL (ref 0.450–4.500)

## 2015-10-26 ENCOUNTER — Other Ambulatory Visit: Payer: Self-pay | Admitting: Family

## 2015-11-04 ENCOUNTER — Other Ambulatory Visit: Payer: Self-pay | Admitting: Family

## 2015-11-04 MED ORDER — METOPROLOL SUCCINATE ER 50 MG PO TB24: ORAL_TABLET | ORAL | Status: DC

## 2015-11-04 MED ORDER — BENAZEPRIL-HYDROCHLOROTHIAZIDE 10-12.5 MG PO TABS: 1.0000 | ORAL_TABLET | Freq: Every day | ORAL | Status: DC

## 2015-11-04 MED ORDER — AMLODIPINE BESYLATE 10 MG PO TABS: ORAL_TABLET | ORAL | Status: DC

## 2015-11-04 NOTE — Telephone Encounter (Signed)
RX sent to pharmacy  

## 2015-11-04 NOTE — Telephone Encounter (Signed)
Patient informed that refills have been sent in by [facility]

## 2016-01-17 ENCOUNTER — Ambulatory Visit: Payer: Medicare Other | Admitting: Family Medicine

## 2016-01-18 ENCOUNTER — Encounter: Payer: Self-pay | Admitting: Family

## 2016-03-15 DIAGNOSIS — N402 Nodular prostate without lower urinary tract symptoms: Secondary | ICD-10-CM | POA: Diagnosis not present

## 2016-03-15 DIAGNOSIS — N281 Cyst of kidney, acquired: Secondary | ICD-10-CM | POA: Diagnosis not present

## 2016-03-15 DIAGNOSIS — N200 Calculus of kidney: Secondary | ICD-10-CM | POA: Diagnosis not present

## 2016-03-17 ENCOUNTER — Encounter: Payer: Self-pay | Admitting: Family Medicine

## 2016-03-17 ENCOUNTER — Ambulatory Visit (INDEPENDENT_AMBULATORY_CARE_PROVIDER_SITE_OTHER): Payer: Medicare Other | Admitting: Family Medicine

## 2016-03-17 VITALS — BP 144/71 | HR 58 | Temp 97.3°F | Ht 69.0 in | Wt 200.0 lb

## 2016-03-17 DIAGNOSIS — I2510 Atherosclerotic heart disease of native coronary artery without angina pectoris: Secondary | ICD-10-CM

## 2016-03-17 DIAGNOSIS — M2540 Effusion, unspecified joint: Secondary | ICD-10-CM | POA: Diagnosis not present

## 2016-03-17 MED ORDER — PREDNISONE 10 MG PO TABS: ORAL_TABLET | ORAL | Status: DC

## 2016-03-17 NOTE — Progress Notes (Signed)
   HPI  Patient presents today here with R knee swelling.   Pt here with R knee swelling, tenderness, and pain for 3 days. He states it began swelling and hurting after he had a large meal of pinto beans. He denies any injury and state his symptoms are similar to his previous episode in August. The prednisone prescribed then worked well.   He has had surgery and injectios of the L knee but not in over a year, he has not had any instrumentation of the R knee. He feels well otherwise, he denies fever, chills, sweats, difficulty tolerating food or fluids.  PMH: Smoking status noted ROS: Per HPI  Objective: BP 144/71 mmHg  Pulse 58  Temp(Src) 97.3 F (36.3 C) (Oral)  Ht 5\' 9"  (1.753 m)  Wt 200 lb (90.719 kg)  BMI 29.52 kg/m2 Gen: NAD, alert, cooperative with exam HEENT: NCAT CV: RRR, good S1/S2, no murmur Resp: CTABL, no wheezes, non-labored Ext: No edema, warm Neuro: Alert and oriented, No gross deficits  Right knee Tender to Palpation throughout, erythematous, warm, slight swelling. Pain with full extension or flexion. No joint laxity, ligamentously intact with anterior posterior drawer test and varus and valgus stress.  Patient walking carefully but bearing weight without a problem.  Assessment and plan:  # Right knee swelling, likely gout Gout versus pseudogout Treat with steroids as he tolerated this easily last time with good improvement. Return to clinic with any worsening symptoms, failure to improve, or other concerns. Would like to check uric acid levels after the flare is completely resolved in 2-3 months.    Meds ordered this encounter  Medications  . predniSONE (DELTASONE) 10 MG tablet    Sig: Take 4 pills a day for 3 days, then 3 pills a day for 3 days, then 2 pills a day for 3 days, then 1 pill a day for 3 days, then stop    Dispense:  30 tablet    Refill:  0    Murtis SinkSam Marlet Korte, MD [facility] 03/17/2016, 9:30 AM

## 2016-03-17 NOTE — Patient Instructions (Signed)
Great to see you!  I have sent prednisone for your Gout flare. Please let us know if you need anything or you have any problems.    Gout Gout is an inflammatory arthritis caused by a buildup of uric acid crystals in the joints. Uric acid is a chemical that is normally present in the blood. When the level of uric acid in the blood is too high it can form crystals that deposit in your joints and tissues. This causes joint redness, soreness, and swelling (inflammation). Repeat attacks are common. Over time, uric acid crystals can form into masses (tophi) near a joint, destroying bone and causing disfigurement. Gout is treatable and often preventable. CAUSES  The disease begins with elevated levels of uric acid in the blood. Uric acid is produced by your body when it breaks down a naturally found substance called purines. Certain foods you eat, such as meats and fish, contain high amounts of purines. Causes of an elevated uric acid level include:  Being passed down from parent to child (heredity).  Diseases that cause increased uric acid production (such as obesity, psoriasis, and certain cancers).  Excessive alcohol use.  Diet, especially diets rich in meat and seafood.  Medicines, including certain cancer-fighting medicines (chemotherapy), water pills (diuretics), and aspirin.  Chronic kidney disease. The kidneys are no longer able to remove uric acid well.  Problems with metabolism. Conditions strongly associated with gout include:  Obesity.  High blood pressure.  High cholesterol.  Diabetes. Not everyone with elevated uric acid levels gets gout. It is not understood why some people get gout and others do not. Surgery, joint injury, and eating too much of certain foods are some of the factors that can lead to gout attacks. SYMPTOMS   An attack of gout comes on quickly. It causes intense pain with redness, swelling, and warmth in a joint.  Fever can occur.  Often, only one joint  is involved. Certain joints are more commonly involved:  Base of the big toe.  Knee.  Ankle.  Wrist.  Finger. Without treatment, an attack usually goes away in a few days to weeks. Between attacks, you usually will not have symptoms, which is different from many other forms of arthritis. DIAGNOSIS  Your caregiver will suspect gout based on your symptoms and exam. In some cases, tests may be recommended. The tests may include:  Blood tests.  Urine tests.  X-rays.  Joint fluid exam. This exam requires a needle to remove fluid from the joint (arthrocentesis). Using a microscope, gout is confirmed when uric acid crystals are seen in the joint fluid. TREATMENT  There are two phases to gout treatment: treating the sudden onset (acute) attack and preventing attacks (prophylaxis).  Treatment of an Acute Attack.  Medicines are used. These include anti-inflammatory medicines or steroid medicines.  An injection of steroid medicine into the affected joint is sometimes necessary.  The painful joint is rested. Movement can worsen the arthritis.  You may use warm or cold treatments on painful joints, depending which works best for you.  Treatment to Prevent Attacks.  If you suffer from frequent gout attacks, your caregiver may advise preventive medicine. These medicines are started after the acute attack subsides. These medicines either help your kidneys eliminate uric acid from your body or decrease your uric acid production. You may need to stay on these medicines for a very long time.  The early phase of treatment with preventive medicine can be associated with an increase in acute gout  attacks. For this reason, during the first few months of treatment, your caregiver may also advise you to take medicines usually used for acute gout treatment. Be sure you understand your caregiver's directions. Your caregiver may make several adjustments to your medicine dose before these medicines are  effective.  Discuss dietary treatment with your caregiver or dietitian. Alcohol and drinks high in sugar and fructose and foods such as meat, poultry, and seafood can increase uric acid levels. Your caregiver or dietitian can advise you on drinks and foods that should be limited. HOME CARE INSTRUCTIONS   Do not take aspirin to relieve pain. This raises uric acid levels.  Only take over-the-counter or prescription medicines for pain, discomfort, or fever as directed by your caregiver.  Rest the joint as much as possible. When in bed, keep sheets and blankets off painful areas.  Keep the affected joint raised (elevated).  Apply warm or cold treatments to painful joints. Use of warm or cold treatments depends on which works best for you.  Use crutches if the painful joint is in your leg.  Drink enough fluids to keep your urine clear or pale yellow. This helps your body get rid of uric acid. Limit alcohol, sugary drinks, and fructose drinks.  Follow your dietary instructions. Pay careful attention to the amount of protein you eat. Your daily diet should emphasize fruits, vegetables, whole grains, and fat-free or low-fat milk products. Discuss the use of coffee, vitamin C, and cherries with your caregiver or dietitian. These may be helpful in lowering uric acid levels.  Maintain a healthy body weight. SEEK MEDICAL CARE IF:   You develop diarrhea, vomiting, or any side effects from medicines.  You do not feel better in 24 hours, or you are getting worse. SEEK IMMEDIATE MEDICAL CARE IF:   Your joint becomes suddenly more tender, and you have chills or a fever. MAKE SURE YOU:   Understand these instructions.  Will watch your condition.  Will get help right away if you are not doing well or get worse.   This information is not intended to replace advice given to you by your health care provider. Make sure you discuss any questions you have with your health care provider.   Document  Released: 08/24/2000 Document Revised: 09/17/2014 Document Reviewed: 04/09/2012 Elsevier Interactive Patient Education [employer]

## 2016-03-26 ENCOUNTER — Other Ambulatory Visit: Payer: Self-pay | Admitting: Family

## 2016-04-03 ENCOUNTER — Encounter: Payer: Self-pay | Admitting: Internal Medicine

## 2016-04-30 ENCOUNTER — Other Ambulatory Visit: Payer: Self-pay | Admitting: Family

## 2016-05-07 ENCOUNTER — Ambulatory Visit: Payer: Medicare Other | Admitting: Family

## 2016-05-19 ENCOUNTER — Other Ambulatory Visit: Payer: Self-pay | Admitting: Family

## 2016-05-21 ENCOUNTER — Ambulatory Visit (INDEPENDENT_AMBULATORY_CARE_PROVIDER_SITE_OTHER): Payer: Medicare Other | Admitting: Family

## 2016-05-21 ENCOUNTER — Encounter: Payer: Self-pay | Admitting: Family

## 2016-05-21 VITALS — BP 153/80 | HR 60 | Temp 97.1°F | Ht 69.0 in | Wt 201.2 lb

## 2016-05-21 DIAGNOSIS — E559 Vitamin D deficiency, unspecified: Secondary | ICD-10-CM

## 2016-05-21 DIAGNOSIS — E039 Hypothyroidism, unspecified: Secondary | ICD-10-CM

## 2016-05-21 DIAGNOSIS — K219 Gastro-esophageal reflux disease without esophagitis: Secondary | ICD-10-CM | POA: Diagnosis not present

## 2016-05-21 DIAGNOSIS — I10 Essential (primary) hypertension: Secondary | ICD-10-CM | POA: Diagnosis not present

## 2016-05-21 DIAGNOSIS — E8881 Metabolic syndrome: Secondary | ICD-10-CM | POA: Diagnosis not present

## 2016-05-21 DIAGNOSIS — I2510 Atherosclerotic heart disease of native coronary artery without angina pectoris: Secondary | ICD-10-CM | POA: Diagnosis not present

## 2016-05-21 DIAGNOSIS — E785 Hyperlipidemia, unspecified: Secondary | ICD-10-CM | POA: Diagnosis not present

## 2016-05-21 DIAGNOSIS — E663 Overweight: Secondary | ICD-10-CM | POA: Diagnosis not present

## 2016-05-21 DIAGNOSIS — Z1211 Encounter for screening for malignant neoplasm of colon: Secondary | ICD-10-CM | POA: Diagnosis not present

## 2016-05-21 MED ORDER — METOPROLOL SUCCINATE ER 50 MG PO TB24: ORAL_TABLET | ORAL | 1 refills | Status: DC

## 2016-05-21 NOTE — Patient Instructions (Signed)

## 2016-05-21 NOTE — Progress Notes (Signed)
Subjective:    Patient ID: Kevin Flores, male    DOB: Jul 29, 1941, 75 y.o.   MRN: [id number]  Pt presents to the office today for chronic follow up.  Hyperlipidemia  This is a chronic problem. The current episode started more than 1 year ago. The problem is controlled. Recent lipid tests were reviewed and are normal. Exacerbating diseases include obesity. He has no history of diabetes. Pertinent negatives include no leg pain. Current antihyperlipidemic treatment includes statins. The current treatment provides significant improvement of lipids. Compliance problems: Pt complaining of muscle cramps with the Crestor- would like meds switched.  Risk factors for coronary artery disease include dyslipidemia, family history, hypertension, male sex and a sedentary lifestyle.  Hypertension  This is a chronic problem. The current episode started more than 1 year ago. The problem has been waxing and waning since onset. The problem is uncontrolled. Pertinent negatives include no anxiety, palpitations or peripheral edema. Risk factors for coronary artery disease include dyslipidemia, male gender, sedentary lifestyle and family history. Past treatments include ACE inhibitors, diuretics and calcium channel blockers. The current treatment provides significant improvement. Hypertensive end-organ damage includes CAD/MI and a thyroid problem. There is no history of kidney disease, CVA or heart failure.  Thyroid Problem  Presents for follow-up visit. Patient reports no anxiety, constipation, diarrhea, fatigue or palpitations. The symptoms have been stable. Past treatments include levothyroxine. The treatment provided moderate relief. His past medical history is significant for hyperlipidemia. There is no history of diabetes or heart failure.  Gastroesophageal Reflux  He complains of heartburn. He reports no belching or no coughing. This is a chronic problem. The problem occurs rarely. The problem has been waxing and  waning. Pertinent negatives include no fatigue. He has tried an antacid and a PPI for the symptoms. The treatment provided moderate relief.      Review of Systems  Constitutional: Negative.  Negative for fatigue.  Respiratory: Negative for cough.   Cardiovascular: Negative.  Negative for palpitations.  Gastrointestinal: Positive for heartburn. Negative for constipation and diarrhea.  Endocrine: Negative.   Genitourinary: Negative.   Musculoskeletal: Negative.   Neurological: Negative.   Hematological: Negative.   Psychiatric/Behavioral: Negative.  The patient is not nervous/anxious.   All other systems reviewed and are negative.      Objective:   Physical Exam  Constitutional: He is oriented to person, place, and time. He appears well-developed and well-nourished. No distress.  HENT:  Head: Normocephalic.  Right Ear: External ear normal.  Left Ear: External ear normal.  Nose: Nose normal.  Mouth/Throat: Oropharynx is clear and moist.  Eyes: Pupils are equal, round, and reactive to light. Right eye exhibits no discharge. Left eye exhibits no discharge.  Neck: Normal range of motion. Neck supple. No thyromegaly present.  Cardiovascular: Normal rate, regular rhythm, normal heart sounds and intact distal pulses.   No murmur heard. Pulmonary/Chest: Effort normal and breath sounds normal. No respiratory distress. He has no wheezes.  Abdominal: Soft. Bowel sounds are normal. He exhibits no distension. There is no tenderness.  Musculoskeletal: Normal range of motion. He exhibits no edema or tenderness.  Neurological: He is alert and oriented to person, place, and time. He has normal reflexes. No cranial nerve deficit.  Skin: Skin is warm and dry. No rash noted. No erythema.  Psychiatric: He has a normal mood and affect. His behavior is normal. Judgment and thought content normal.  Vitals reviewed.   BP (!) 153/80   Pulse 60   Temp 97.1  F (36.2 C) (Oral)   Ht '5\' 9"'  (1.753 m)    Wt 201 lb 3.2 oz (91.3 kg)   BMI 29.71 kg/m        Assessment & Plan:  1. Atherosclerosis of native coronary artery of native heart without angina pectoris - CMP14+EGFR  2. Essential hypertension - metoprolol succinate (TOPROL-XL) 50 MG 24 hr tablet; TAKE 1 TABLET (50 MG TOTAL) BY MOUTH EVERY MORNING.  Dispense: 90 tablet; Refill: 1 - CMP14+EGFR  3. Gastroesophageal reflux disease, esophagitis presence not specified - CMP14+EGFR  4. Hypothyroidism, unspecified hypothyroidism type - CMP14+EGFR - Thyroid Panel With TSH  5. Hyperlipidemia - CMP14+EGFR - Lipid panel  6. Vitamin D deficiency - ZUA04+BVPL  7. Metabolic syndrome - WUZ99+UFCZ  8. Overweight (BMI 25.0-29.9) - CMP14+EGFR  9. Colon cancer screening - Fecal occult blood, imunochemical; Future   Continue all meds Labs pending Health Maintenance reviewed Diet and exercise encouraged RTO 6 months  Evelina Dun, FNP

## 2016-05-22 LAB — CMP14+EGFR
ALT: 16 IU/L (ref 0–44)
AST: 17 IU/L (ref 0–40)
Albumin/Globulin Ratio: 1.5 (ref 1.2–2.2)
Albumin: 4.2 g/dL (ref 3.5–4.8)
Alkaline Phosphatase: 84 IU/L (ref 39–117)
BUN/Creatinine Ratio: 12 (ref 10–24)
BUN: 20 mg/dL (ref 8–27)
Bilirubin Total: 0.6 mg/dL (ref 0.0–1.2)
CALCIUM: 9.6 mg/dL (ref 8.6–10.2)
CO2: 23 mmol/L (ref 18–29)
CREATININE: 1.65 mg/dL — AB (ref 0.76–1.27)
Chloride: 102 mmol/L (ref 96–106)
GFR calc Af Amer: 47 mL/min/{1.73_m2} — ABNORMAL LOW (ref >59)
GFR, EST NON AFRICAN AMERICAN: 41 mL/min/{1.73_m2} — AB (ref >59)
GLUCOSE: 102 mg/dL — AB (ref 65–99)
Globulin, Total: 2.8 g/dL (ref 1.5–4.5)
POTASSIUM: 4.7 mmol/L (ref 3.5–5.2)
Sodium: 143 mmol/L (ref 134–144)
Total Protein: 7 g/dL (ref 6.0–8.5)

## 2016-05-22 LAB — LIPID PANEL
CHOL/HDL RATIO: 4 ratio (ref 0.0–5.0)
Cholesterol, Total: 123 mg/dL (ref 100–199)
HDL: 31 mg/dL — AB (ref >39)
LDL CALC: 71 mg/dL (ref 0–99)
TRIGLYCERIDES: 105 mg/dL (ref 0–149)
VLDL Cholesterol Cal: 21 mg/dL (ref 5–40)

## 2016-05-22 LAB — THYROID PANEL WITH TSH
FREE THYROXINE INDEX: 2.2 (ref 1.2–4.9)
T3 Uptake Ratio: 27 % (ref 24–39)
T4, Total: 8.3 ug/dL (ref 4.5–12.0)
TSH: 3.62 u[IU]/mL (ref 0.450–4.500)

## 2016-06-14 DIAGNOSIS — Z23 Encounter for immunization: Secondary | ICD-10-CM | POA: Diagnosis not present

## 2016-08-03 ENCOUNTER — Ambulatory Visit (INDEPENDENT_AMBULATORY_CARE_PROVIDER_SITE_OTHER): Payer: Medicare Other | Admitting: Family Medicine

## 2016-08-03 ENCOUNTER — Encounter: Payer: Self-pay | Admitting: Family Medicine

## 2016-08-03 VITALS — BP 133/69 | HR 61 | Temp 96.8°F | Ht 69.0 in | Wt 204.6 lb

## 2016-08-03 DIAGNOSIS — M109 Gout, unspecified: Secondary | ICD-10-CM

## 2016-08-03 DIAGNOSIS — I2510 Atherosclerotic heart disease of native coronary artery without angina pectoris: Secondary | ICD-10-CM

## 2016-08-03 MED ORDER — INDOMETHACIN 50 MG PO CAPS: 50.0000 mg | ORAL_CAPSULE | Freq: Two times a day (BID) | ORAL | 0 refills | Status: DC

## 2016-08-03 NOTE — Progress Notes (Signed)
BP 133/69   Pulse 61   Temp (!) 96.8 F (36 C) (Oral)   Ht 5\' 9"  (1.753 m)   Wt 204 lb 9.6 oz (92.8 kg)   BMI 30.21 kg/m    Subjective:    Patient ID: Kevin Flores, male    DOB: 10/06/1940, 75 y.o.   MRN: [id number]  HPI: Kevin Flores is a 75 y.o. male presenting on 08/03/2016 for Gout   HPI Gout attack Patient is coming in today with complaints of a gout attack that is both in his left index finger on the DIP joint and his left great toe. He has had it previously and his right index finger and his right toe and his left toe but this is the first time he has had it in his left finger. The finger is swollen and inflamed and red. He has difficulty bending it completely because of how swollen it is. He denies any fevers or chills or redness or warmth anywhere else. The toe feels the same as the finger. He has had this previously a couple of times a few years ago.  Relevant past medical, surgical, family and social history reviewed and updated as indicated. Interim medical history since our last visit reviewed. Allergies and medications reviewed and updated.  Review of Systems  Constitutional: Negative for chills and fever.  Eyes: Negative for discharge.  Respiratory: Negative for shortness of breath and wheezing.   Cardiovascular: Negative for chest pain and leg swelling.  Musculoskeletal: Positive for arthralgias and joint swelling. Negative for back pain and gait problem.  Skin: Positive for color change. Negative for rash.  All other systems reviewed and are negative.   Per HPI unless specifically indicated above     Objective:    BP 133/69   Pulse 61   Temp (!) 96.8 F (36 C) (Oral)   Ht 5\' 9"  (1.753 m)   Wt 204 lb 9.6 oz (92.8 kg)   BMI 30.21 kg/m   Wt Readings from Last 3 Encounters:  08/03/16 204 lb 9.6 oz (92.8 kg)  05/21/16 201 lb 3.2 oz (91.3 kg)  03/17/16 200 lb (90.7 kg)    Physical Exam  Constitutional: He is oriented to person, place, and time. He  appears well-developed and well-nourished. No distress.  Eyes: Conjunctivae are normal. Right eye exhibits no discharge. Left eye exhibits no discharge. No scleral icterus.  Musculoskeletal: Normal range of motion. He exhibits no edema.       Left hand: He exhibits tenderness and swelling. He exhibits no deformity.       Hands:      Right foot: There is tenderness and swelling.       Feet:  Neurological: He is alert and oriented to person, place, and time. Coordination normal.  Skin: Skin is warm and dry. No rash noted. He is not diaphoretic. There is erythema.  Psychiatric: He has a normal mood and affect. His behavior is normal.  Nursing note and vitals reviewed.     Assessment & Plan:   Problem List Items Addressed This Visit    None    Visit Diagnoses    Acute gout of multiple sites, unspecified cause    -  Primary   Relevant Medications   indomethacin (INDOCIN) 50 MG capsule       Follow up plan: Return if symptoms worsen or fail to improve.  Counseling provided for all of the vaccine components No orders of the defined types were placed in this  encounter.   Arville CareJoshua Temple Ewart, MD [facility] 08/03/2016, 11:31 AM

## 2016-10-30 ENCOUNTER — Other Ambulatory Visit: Payer: Self-pay | Admitting: Family Medicine

## 2016-10-30 ENCOUNTER — Other Ambulatory Visit: Payer: Self-pay | Admitting: Family

## 2016-10-30 DIAGNOSIS — E785 Hyperlipidemia, unspecified: Secondary | ICD-10-CM

## 2017-01-25 ENCOUNTER — Other Ambulatory Visit: Payer: Self-pay | Admitting: Family

## 2017-01-25 DIAGNOSIS — E785 Hyperlipidemia, unspecified: Secondary | ICD-10-CM

## 2017-01-28 ENCOUNTER — Ambulatory Visit (INDEPENDENT_AMBULATORY_CARE_PROVIDER_SITE_OTHER): Payer: Medicare Other | Admitting: Family Medicine

## 2017-01-28 ENCOUNTER — Encounter: Payer: Self-pay | Admitting: Family Medicine

## 2017-01-28 ENCOUNTER — Ambulatory Visit (INDEPENDENT_AMBULATORY_CARE_PROVIDER_SITE_OTHER): Payer: Medicare Other

## 2017-01-28 ENCOUNTER — Telehealth: Payer: Self-pay | Admitting: Family Medicine

## 2017-01-28 VITALS — BP 138/78 | HR 67 | Temp 97.2°F | Ht 69.0 in | Wt 204.2 lb

## 2017-01-28 DIAGNOSIS — M25571 Pain in right ankle and joints of right foot: Secondary | ICD-10-CM

## 2017-01-28 DIAGNOSIS — S82891A Other fracture of right lower leg, initial encounter for closed fracture: Secondary | ICD-10-CM

## 2017-01-28 MED ORDER — PREDNISONE 20 MG PO TABS: ORAL_TABLET | ORAL | 0 refills | Status: DC

## 2017-01-28 NOTE — Patient Instructions (Signed)
Great to see you!  Start prednisone today with food, call or come back with any concerns  I recommend you see your PCP to discuss Gout treatment long term.

## 2017-01-28 NOTE — Telephone Encounter (Signed)
Called and discussed his ankle fracture. Avulsion fractures, discussed difference between traditional fractures and avulsion fractures.  Recommended using crutches.  Orthopedic referral, hopefully will be seen this week.  Patient states that it be best if he could be seen Thursday, in 3 days.  Murtis SinkSam Giovanni Bath, MD [facility] 01/28/2017, 1:14 PM

## 2017-01-28 NOTE — Telephone Encounter (Signed)
Ankle avulsion fractures, pt went to work and will be home in 30 minutes.  Will recommend referral to ortho and use crutches.   Murtis SinkSam Geovanni Rahming, MD [facility] 01/28/2017, 12:08 PM

## 2017-01-28 NOTE — Progress Notes (Signed)
   HPI  Patient presents today here with right ankle pain.  Patient explains that over the last 3 days he's had severe right ankle pain. He describes it as heel pain that radiates up his posterior ankle. He denies any injury. He can still walk, however it is painful to bear weight.  He states that the pain is as severe as his gout attacks usually, however it's in a different location than usual. He is previously done well with prednisone and indomethacin.  PMH: Smoking status noted ROS: Per HPI  Objective: BP 138/78   Pulse 67   Temp 97.2 F (36.2 C) (Oral)   Ht 5\' 9"  (1.753 m)   Wt 204 lb 3.2 oz (92.6 kg)   BMI 30.16 kg/m  Gen: NAD, alert, cooperative with exam HEENT: NCAT CV: RRR, good S1/S2, no murmur Resp: CTABL, no wheezes, non-labored Ext: No edema, warm Neuro: Alert and oriented, No gross deficits  Tenderness to palpation of the right side of plantar fascia, also tenderness with flexion or extension of the ankle, no joint laxity. Mild swelling, no erythema or unusual warmth   Assessment and plan:  # Right ankle pain Most likely gout flare. Prednisone X-ray given severe tenderness with palpation as well as flexion and extension.  Meds ordered this encounter  Medications  . predniSONE (DELTASONE) 20 MG tablet    Sig: 2 po at same time daily for 5 days    Dispense:  10 tablet    Refill:  0    Murtis SinkSam Bradshaw, MD [facility] 01/28/2017, 9:31 AM

## 2017-01-29 ENCOUNTER — Telehealth: Payer: Self-pay | Admitting: Family

## 2017-01-29 NOTE — Telephone Encounter (Signed)
Patient aware we will cancel referral.  Followup appointment on 02/12/17 at 1:55 pm

## 2017-01-29 NOTE — Telephone Encounter (Signed)
Ok, lets plan on a 2 week follow up  Kevin SinkSam Allie Gerhold, MD [facility] 01/29/2017, 9:26 AM

## 2017-02-05 ENCOUNTER — Other Ambulatory Visit: Payer: Self-pay | Admitting: Family

## 2017-02-12 ENCOUNTER — Telehealth: Payer: Self-pay | Admitting: Family Medicine

## 2017-02-12 ENCOUNTER — Ambulatory Visit (INDEPENDENT_AMBULATORY_CARE_PROVIDER_SITE_OTHER): Payer: Medicare Other | Admitting: Family Medicine

## 2017-02-12 ENCOUNTER — Encounter: Payer: Self-pay | Admitting: Family Medicine

## 2017-02-12 VITALS — BP 126/71 | HR 75 | Temp 98.1°F | Ht 69.0 in | Wt 202.8 lb

## 2017-02-12 DIAGNOSIS — M1A0710 Idiopathic chronic gout, right ankle and foot, without tophus (tophi): Secondary | ICD-10-CM | POA: Diagnosis not present

## 2017-02-12 MED ORDER — FEBUXOSTAT 40 MG PO TABS: 40.0000 mg | ORAL_TABLET | Freq: Every day | ORAL | 11 refills | Status: DC

## 2017-02-12 MED ORDER — ALLOPURINOL 100 MG PO TABS: ORAL_TABLET | ORAL | 0 refills | Status: DC

## 2017-02-12 MED ORDER — COLCHICINE 0.6 MG PO TABS: 0.6000 mg | ORAL_TABLET | Freq: Every day | ORAL | 1 refills | Status: DC

## 2017-02-12 NOTE — Patient Instructions (Signed)
Great to see you!  I have started 2 new medicines  o prevent gout long term- take uloric 1 pill once daily  To prevent flares while Uloric begins to work- Take 1 colchicine pill once daily ( only 2 months)

## 2017-02-12 NOTE — Telephone Encounter (Signed)
Patient aware and verbalizes understanding. 

## 2017-02-12 NOTE — Progress Notes (Signed)
   HPI  Patient presents today here to follow-up for ankle pain.  Patient explains that he did not have any injury prior to the ankle pain. Very quick relief with prednisone. He would like to take something more long-term for gout to prevent flares.  We discussed his avulsion fractures that were seen on the x-ray, repeat films are reviewed with him. He denies any current pain or previous injury. He does not want to follow-up with orthopedics.  PMH: Smoking status noted ROS: Per HPI  Objective: BP 126/71   Pulse 75   Temp 98.1 F (36.7 C) (Oral)   Ht '5\' 9"'$  (1.753 m)   Wt 202 lb 12.8 oz (92 kg)   BMI 29.95 kg/m  Gen: NAD, alert, cooperative with exam HEENT: NCAT, CV: RRR, good S1/S2, no murmur Resp: CTABL, no wheezes, non-labored Ext: No edema, warm Neuro: Alert and oriented, No gross deficits MSK:  NO TTP of the malleoli BL, no redness or warmth  Assessment and plan:  # Gout Gout of right ankle, multiple flares Starting Uloric, take with colchicine daily X 2 months, then DC colchicine.  Uric acid and CMP today, uric acid goal less than 6    Orders Placed This Encounter  Procedures  . CMP14+EGFR  . Uric acid    Meds ordered this encounter  Medications  . febuxostat (ULORIC) 40 MG tablet    Sig: Take 1 tablet (40 mg total) by mouth daily.    Dispense:  30 tablet    Refill:  11  . colchicine 0.6 MG tablet    Sig: Take 1 tablet (0.6 mg total) by mouth daily.    Dispense:  30 tablet    Refill:  [place], MD [facility] 02/12/2017, 2:20 PM

## 2017-02-12 NOTE — Telephone Encounter (Signed)
Will change to cautiously dosed allopurinol given CKD 3  Start 100 mg dialy X 1 week then 200 mg daily until 1 month follow up.   Murtis SinkSam Nicklas Mcsweeney, MD [facility] 02/12/2017, 4:05 PM

## 2017-02-12 NOTE — Telephone Encounter (Signed)
Pt states both meds for gout are still expensive. Colchicine is $50 & Uloric $160 Is there something else

## 2017-02-13 LAB — URIC ACID: URIC ACID: 9.1 mg/dL — AB (ref 3.7–8.6)

## 2017-02-13 LAB — CMP14+EGFR
A/G RATIO: 1.4 (ref 1.2–2.2)
ALK PHOS: 86 IU/L (ref 39–117)
ALT: 16 IU/L (ref 0–44)
AST: 15 IU/L (ref 0–40)
Albumin: 4.3 g/dL (ref 3.5–4.8)
BUN/Creatinine Ratio: 14 (ref 10–24)
BUN: 24 mg/dL (ref 8–27)
Bilirubin Total: 0.6 mg/dL (ref 0.0–1.2)
CO2: 23 mmol/L (ref 18–29)
Calcium: 9.8 mg/dL (ref 8.6–10.2)
Chloride: 105 mmol/L (ref 96–106)
Creatinine, Ser: 1.71 mg/dL — ABNORMAL HIGH (ref 0.76–1.27)
GFR calc Af Amer: 45 mL/min/{1.73_m2} — ABNORMAL LOW (ref >59)
GFR calc non Af Amer: 39 mL/min/{1.73_m2} — ABNORMAL LOW (ref >59)
Globulin, Total: 3 g/dL (ref 1.5–4.5)
Glucose: 100 mg/dL — ABNORMAL HIGH (ref 65–99)
POTASSIUM: 4.5 mmol/L (ref 3.5–5.2)
Sodium: 142 mmol/L (ref 134–144)
Total Protein: 7.3 g/dL (ref 6.0–8.5)

## 2017-02-19 ENCOUNTER — Telehealth: Payer: Self-pay

## 2017-02-19 NOTE — Telephone Encounter (Signed)
Yes please see if we can get covered for pt. IT was changed to allopurinol, but if it is covered we will change to Uloric.

## 2017-03-08 ENCOUNTER — Ambulatory Visit (INDEPENDENT_AMBULATORY_CARE_PROVIDER_SITE_OTHER): Payer: Medicare Other | Admitting: Family Medicine

## 2017-03-08 ENCOUNTER — Telehealth: Payer: Self-pay | Admitting: Family Medicine

## 2017-03-08 ENCOUNTER — Encounter: Payer: Self-pay | Admitting: Family Medicine

## 2017-03-08 VITALS — BP 123/76 | HR 63 | Temp 97.7°F | Ht 69.0 in | Wt 203.6 lb

## 2017-03-08 DIAGNOSIS — M1A0710 Idiopathic chronic gout, right ankle and foot, without tophus (tophi): Secondary | ICD-10-CM | POA: Diagnosis not present

## 2017-03-08 DIAGNOSIS — M722 Plantar fascial fibromatosis: Secondary | ICD-10-CM | POA: Diagnosis not present

## 2017-03-08 DIAGNOSIS — R202 Paresthesia of skin: Secondary | ICD-10-CM

## 2017-03-08 MED ORDER — ALLOPURINOL 100 MG PO TABS: 200.0000 mg | ORAL_TABLET | Freq: Every day | ORAL | 1 refills | Status: DC

## 2017-03-08 MED ORDER — ALLOPURINOL 100 MG PO TABS
200.0000 mg | ORAL_TABLET | Freq: Every day | ORAL | 1 refills | Status: DC
Start: 2017-03-08 — End: 2017-05-09

## 2017-03-08 NOTE — Telephone Encounter (Signed)
Pt aware this has been changed to [employer]

## 2017-03-08 NOTE — Progress Notes (Signed)
HPI  Patient presents today for follow-up gout, also discussed neck pain and left foot pain.  Gout Patient is tolerating 200 mg of allopurinol easily, he is no longer taking colchicine daily. He states that his ankle pain is well resolved and he has no other complaints about that.  Left foot pain Pain in the left plantar fascia insertion, states that he's had this pain several times in his life and has been told his plantar fasciitis several times. Has not tried ice or stretches.  Neck pain Left-sided neck pain along the left trapezius border along the anterior edge, described as "feeling like spiders crawling"  He has not tried topical creams. Heat and/or ice has helped.  PMH: Smoking status noted ROS: Per HPI  Objective: BP 123/76   Pulse 63   Temp 97.7 F (36.5 C) (Oral)   Ht 5' 9" (1.753 m)   Wt 203 lb 9.6 oz (92.4 kg)   BMI 30.07 kg/m  Gen: NAD, alert, cooperative with exam HEENT: NCAT CV: RRR, good S1/S2, no murmur Resp: CTABL, no wheezes, non-labored Ext: No edema, warm Neuro: Alert and oriented, No gross deficits MSK No tenderness to palpation of left trapezius, no gross abnormality, no rash. Right ankle: No abnormality, nontender to palpation Left foot: Tenderness to palpation of the left plantar fascia insertion  Assessment and plan:  # Chronic gout of the right ankle Improved on allopurinol Continued 200 mg for now, rechecking uric acid with goal less than 6. Patient understands he will likely have to increase to 300 mg. I did recommend continuing colchicine until medication was well titrated, however he has stopped and is tolerating easily.  # Left neck pain- anesthesia Most consistent with paresthesia Unclear etiology, unlikely to be medication effect. Discussed topical creams that may help, could try gabapentin, however patient is hesitant to try another medication for a mild to moderate problem  # Plantar fasciitis Left foot Discussed supportive  care extensively given handout from sports medicine patient advisor.    Orders Placed This Encounter  Procedures  . Uric acid  . Hepatic function panel  . BMP8+EGFR    Meds ordered this encounter  Medications  . DISCONTD: allopurinol (ZYLOPRIM) 100 MG tablet    Sig: Take 2 tablets (200 mg total) by mouth daily.    Dispense:  60 tablet    Refill:  [place], MD [place] 03/08/2017, 11:43 AM

## 2017-03-08 NOTE — Patient Instructions (Signed)
Great to see yoU!  Try aspercreme and your ache cream for the neck  Continue 2 pills daily until we notify you otherwise  Try the stretches in the handout.

## 2017-04-09 ENCOUNTER — Ambulatory Visit (INDEPENDENT_AMBULATORY_CARE_PROVIDER_SITE_OTHER): Payer: Medicare Other | Admitting: Physician Assistant

## 2017-04-09 ENCOUNTER — Encounter: Payer: Self-pay | Admitting: Physician Assistant

## 2017-04-09 VITALS — BP 115/62 | HR 67 | Temp 97.8°F | Ht 69.0 in | Wt 203.6 lb

## 2017-04-09 DIAGNOSIS — M7662 Achilles tendinitis, left leg: Secondary | ICD-10-CM

## 2017-04-09 DIAGNOSIS — M722 Plantar fascial fibromatosis: Secondary | ICD-10-CM | POA: Diagnosis not present

## 2017-04-09 MED ORDER — NAPROXEN 500 MG PO TABS: 500.0000 mg | ORAL_TABLET | Freq: Two times a day (BID) | ORAL | 0 refills | Status: DC

## 2017-04-09 MED ORDER — METHYLPREDNISOLONE ACETATE 80 MG/ML IJ SUSP
80.0000 mg | Freq: Once | INTRAMUSCULAR | Status: AC
  Administered 2017-04-09: 80 mg via INTRAMUSCULAR

## 2017-04-09 NOTE — Patient Instructions (Signed)
In a few days you may receive a survey in the mail or online from [employer] regarding your visit with us today. Please take a moment to fill this out. Your feedback is very important to our whole office. It can help us better understand your needs as well as improve your experience and satisfaction. Thank you for taking your time to complete it. We care about you.  Gerardine Peltz, PA-C  

## 2017-04-09 NOTE — Progress Notes (Signed)
BP 115/62   Pulse 67   Temp 97.8 F (36.6 C) (Oral)   Ht 5\' 9"  (1.753 m)   Wt 203 lb 9.6 oz (92.4 kg)   BMI 30.07 kg/m    Subjective:    Patient ID: Kevin DrownJohnny Minnie, male    DOB: 02/24/1941, 76 y.o.   MRN: [id number]  HPI: Kevin Flores is a 76 y.o. male presenting on 04/09/2017 for Foot Pain (left )  Patient has had worsening pain at times in his left foot it is in the heel and wraps around to the back of the heel. He has had a diagnosis of plantar fasciitis in the past. It even hurts to stretch in that area and behind the heel every morning when he gets up. We have talked about stretching. I demonstrated some exercises he can do in heel lifts and stretches. I've encouraged him to ice it every night after he works. And have him use some anti-inflammatory for the next 10 days. If it flares up again he continues it again in the future. We will stop indomethacin at this time. We will also have him take a shot of Depo-Medrol.  Relevant past medical, surgical, family and social history reviewed and updated as indicated. Allergies and medications reviewed and updated.  Past Medical History:  Diagnosis Date  . Allergy    seasonal  . Arthritis   . CAD (coronary artery disease)    1998 LIMA to the LAD, SVG to circumflex. DES placed to the PDA 11/2009   . Gout   . Heartburn   . Hyperlipidemia   . Hypertension   . Nephrolithiasis    left  . Personal history of colonic polyps    adenomas  . Thyroid disease     Past Surgical History:  Procedure Laterality Date  . BIOPSY PROSTATE    . cardiac stents  2001   and [id number]  . COLONOSCOPY W/ POLYPECTOMY  2009   3 small adenomas  . CORONARY ARTERY BYPASS GRAFT  1998   2 bypass  . cysto with calculus manipulation    . UPPER GASTROINTESTINAL ENDOSCOPY      Review of Systems  Constitutional: Negative.  Negative for appetite change and fatigue.  Eyes: Negative for pain and visual disturbance.  Respiratory: Negative.  Negative for cough, chest  tightness, shortness of breath and wheezing.   Cardiovascular: Negative.  Negative for chest pain, palpitations and leg swelling.  Gastrointestinal: Negative.  Negative for abdominal pain, diarrhea, nausea and vomiting.  Genitourinary: Negative.   Musculoskeletal: Positive for arthralgias and joint swelling.  Skin: Negative.  Negative for color change and rash.  Neurological: Negative.  Negative for weakness, numbness and headaches.  Psychiatric/Behavioral: Negative.     Allergies as of 04/09/2017      Reactions   Rosuvastatin    Muscle cramps and aches.   Sulfonamide Derivatives       Medication List       Accurate as of 04/09/17  4:57 PM. Always use your most recent med list.          albuterol 108 (90 Base) MCG/ACT inhaler Commonly known as:  PROVENTIL HFA;VENTOLIN HFA Inhale 2 puffs into the lungs every 6 (six) hours as needed for wheezing or shortness of breath.   allopurinol 100 MG tablet Commonly known as:  ZYLOPRIM Take 2 tablets (200 mg total) by mouth daily.   amLODipine 10 MG tablet Commonly known as:  NORVASC TAKE 1 TABLET BY MOUTH ONCE DAILY  aspirin 325 MG tablet Take 325 mg by mouth daily.   atorvastatin 20 MG tablet Commonly known as:  LIPITOR TAKE 1 TABLET BY MOUTH ONCE DAILY   benazepril-hydrochlorthiazide 10-12.5 MG tablet Commonly known as:  LOTENSIN HCT TAKE 1 TABLET BY MOUTH ONCE DAILY   chlorproMAZINE 10 MG tablet Commonly known as:  THORAZINE Take 1 tablet (10 mg total) by mouth 4 (four) times daily. Prn hiccups   cholecalciferol 1000 units tablet Commonly known as:  VITAMIN D Take 1,000 Units by mouth daily.   colchicine 0.6 MG tablet Take 1 tablet (0.6 mg total) by mouth daily.   esomeprazole 40 MG capsule Commonly known as:  NEXIUM Take 1 capsule (40 mg total) by mouth daily.   fish oil-omega-3 fatty acids 1000 MG capsule Take 1 g by mouth daily.   levothyroxine 50 MCG tablet Commonly known as:  SYNTHROID, LEVOTHROID TAKE  ONE TABLET BY MOUTH ONCE DAILY IN THE MORNING   metoprolol succinate 50 MG 24 hr tablet Commonly known as:  TOPROL-XL TAKE 1 TABLET (50 MG TOTAL) BY MOUTH EVERY MORNING.   metoprolol succinate 50 MG 24 hr tablet Commonly known as:  TOPROL-XL TAKE ONE TABLET BY MOUTH IN THE MORNING   naproxen 500 MG tablet Commonly known as:  NAPROSYN Take 1 tablet (500 mg total) by mouth 2 (two) times daily with a meal.          Objective:    BP 115/62   Pulse 67   Temp 97.8 F (36.6 C) (Oral)   Ht 5\' 9"  (1.753 m)   Wt 203 lb 9.6 oz (92.4 kg)   BMI 30.07 kg/m   Allergies  Allergen Reactions  . Rosuvastatin     Muscle cramps and aches.  . Sulfonamide Derivatives     Physical Exam  Constitutional: He appears well-developed and well-nourished. No distress.  HENT:  Head: Normocephalic and atraumatic.  Eyes: Pupils are equal, round, and reactive to light. Conjunctivae and EOM are normal.  Cardiovascular: Normal rate, regular rhythm and normal heart sounds.   Pulmonary/Chest: Effort normal and breath sounds normal. No respiratory distress.  Musculoskeletal:       Left ankle: He exhibits no deformity. Tenderness. Posterior TFL tenderness found. Achilles tendon exhibits pain. Achilles tendon exhibits normal Thompson's test results.       Feet:  Skin: Skin is warm and dry.  Psychiatric: He has a normal mood and affect. His behavior is normal.  Nursing note and vitals reviewed.       Assessment & Plan:   1. Plantar fasciitis - naproxen (NAPROSYN) 500 MG tablet; Take 1 tablet (500 mg total) by mouth 2 (two) times daily with a meal.  Dispense: 60 tablet; Refill: 0 - methylPREDNISolone acetate (DEPO-MEDROL) injection 80 mg; Inject 1 mL (80 mg total) into the muscle once.  2. Achilles tendinitis of left lower extremity - naproxen (NAPROSYN) 500 MG tablet; Take 1 tablet (500 mg total) by mouth 2 (two) times daily with a meal.  Dispense: 60 tablet; Refill: 0 - methylPREDNISolone acetate  (DEPO-MEDROL) injection 80 mg; Inject 1 mL (80 mg total) into the muscle once.   Current Outpatient Prescriptions:  .  albuterol (PROVENTIL HFA;VENTOLIN HFA) 108 (90 BASE) MCG/ACT inhaler, Inhale 2 puffs into the lungs every 6 (six) hours as needed for wheezing or shortness of breath., Disp: 1 Inhaler, Rfl: 3 .  allopurinol (ZYLOPRIM) 100 MG tablet, Take 2 tablets (200 mg total) by mouth daily., Disp: 60 tablet, Rfl: 1 .  amLODipine (NORVASC) 10 MG tablet, TAKE 1 TABLET BY MOUTH ONCE DAILY, Disp: 90 tablet, Rfl: 0 .  aspirin 325 MG tablet, Take 325 mg by mouth daily.  , Disp: , Rfl:  .  atorvastatin (LIPITOR) 20 MG tablet, TAKE 1 TABLET BY MOUTH ONCE DAILY, Disp: 90 tablet, Rfl: 0 .  benazepril-hydrochlorthiazide (LOTENSIN HCT) 10-12.5 MG tablet, TAKE 1 TABLET BY MOUTH ONCE DAILY, Disp: 90 tablet, Rfl: 0 .  chlorproMAZINE (THORAZINE) 10 MG tablet, Take 1 tablet (10 mg total) by mouth 4 (four) times daily. Prn hiccups, Disp: 28 tablet, Rfl: 0 .  cholecalciferol (VITAMIN D) 1000 UNITS tablet, Take 1,000 Units by mouth daily.  , Disp: , Rfl:  .  colchicine 0.6 MG tablet, Take 1 tablet (0.6 mg total) by mouth daily., Disp: 30 tablet, Rfl: 1 .  esomeprazole (NEXIUM) 40 MG capsule, Take 1 capsule (40 mg total) by mouth daily., Disp: 30 capsule, Rfl: 3 .  fish oil-omega-3 fatty acids 1000 MG capsule, Take 1 g by mouth daily.  , Disp: , Rfl:  .  levothyroxine (SYNTHROID, LEVOTHROID) 50 MCG tablet, TAKE ONE TABLET BY MOUTH ONCE DAILY IN THE MORNING, Disp: 90 tablet, Rfl: 2 .  metoprolol succinate (TOPROL-XL) 50 MG 24 hr tablet, TAKE 1 TABLET (50 MG TOTAL) BY MOUTH EVERY MORNING., Disp: 90 tablet, Rfl: 1 .  metoprolol succinate (TOPROL-XL) 50 MG 24 hr tablet, TAKE ONE TABLET BY MOUTH IN THE MORNING, Disp: 90 tablet, Rfl: 0 .  naproxen (NAPROSYN) 500 MG tablet, Take 1 tablet (500 mg total) by mouth 2 (two) times daily with a meal., Disp: 60 tablet, Rfl: 0  Continue all other maintenance medications as  listed above.  Follow up plan: Return if symptoms worsen or fail to improve.  Educational handout given for survey  Remus LofflerAngel S. Tonyetta Berko PA-C [facility] [address]  [place], [place] [address] [phone]   04/09/2017, 4:57 PM

## 2017-04-27 ENCOUNTER — Other Ambulatory Visit: Payer: Self-pay | Admitting: Family Medicine

## 2017-04-27 DIAGNOSIS — E785 Hyperlipidemia, unspecified: Secondary | ICD-10-CM

## 2017-05-04 ENCOUNTER — Other Ambulatory Visit: Payer: Self-pay | Admitting: Family

## 2017-05-09 ENCOUNTER — Other Ambulatory Visit: Payer: Self-pay | Admitting: Family Medicine

## 2017-06-06 DIAGNOSIS — H40033 Anatomical narrow angle, bilateral: Secondary | ICD-10-CM | POA: Diagnosis not present

## 2017-06-06 DIAGNOSIS — H2513 Age-related nuclear cataract, bilateral: Secondary | ICD-10-CM | POA: Diagnosis not present

## 2017-06-10 ENCOUNTER — Other Ambulatory Visit: Payer: Self-pay | Admitting: Family Medicine

## 2017-07-02 DIAGNOSIS — Z23 Encounter for immunization: Secondary | ICD-10-CM | POA: Diagnosis not present

## 2017-07-16 ENCOUNTER — Other Ambulatory Visit: Payer: Self-pay | Admitting: Family Medicine

## 2017-07-26 ENCOUNTER — Other Ambulatory Visit: Payer: Self-pay | Admitting: Family Medicine

## 2017-07-26 ENCOUNTER — Other Ambulatory Visit: Payer: Self-pay | Admitting: Family

## 2017-07-26 DIAGNOSIS — E785 Hyperlipidemia, unspecified: Secondary | ICD-10-CM

## 2017-07-29 NOTE — Telephone Encounter (Signed)
Patient NTBS for follow up and lab work  

## 2017-08-07 ENCOUNTER — Other Ambulatory Visit: Payer: Self-pay | Admitting: Family Medicine

## 2017-08-15 ENCOUNTER — Other Ambulatory Visit: Payer: Medicare Other

## 2017-08-15 DIAGNOSIS — M1A0710 Idiopathic chronic gout, right ankle and foot, without tophus (tophi): Secondary | ICD-10-CM | POA: Diagnosis not present

## 2017-08-16 LAB — BMP8+EGFR
BUN / CREAT RATIO: 13 (ref 10–24)
BUN: 24 mg/dL (ref 8–27)
CALCIUM: 9.8 mg/dL (ref 8.6–10.2)
CHLORIDE: 103 mmol/L (ref 96–106)
CO2: 23 mmol/L (ref 20–29)
Creatinine, Ser: 1.87 mg/dL — ABNORMAL HIGH (ref 0.76–1.27)
GFR calc non Af Amer: 35 mL/min/{1.73_m2} — ABNORMAL LOW (ref >59)
GFR, EST AFRICAN AMERICAN: 40 mL/min/{1.73_m2} — AB (ref >59)
GLUCOSE: 99 mg/dL (ref 65–99)
POTASSIUM: 4.2 mmol/L (ref 3.5–5.2)
Sodium: 141 mmol/L (ref 134–144)

## 2017-08-16 LAB — URIC ACID: URIC ACID: 6.9 mg/dL (ref 3.7–8.6)

## 2017-08-16 LAB — HEPATIC FUNCTION PANEL
ALBUMIN: 4.3 g/dL (ref 3.5–4.8)
ALT: 12 IU/L (ref 0–44)
AST: 15 IU/L (ref 0–40)
Alkaline Phosphatase: 94 IU/L (ref 39–117)
BILIRUBIN TOTAL: 0.7 mg/dL (ref 0.0–1.2)
Bilirubin, Direct: 0.17 mg/dL (ref 0.00–0.40)
Total Protein: 7.3 g/dL (ref 6.0–8.5)

## 2017-08-20 ENCOUNTER — Ambulatory Visit: Payer: Medicare Other | Admitting: Family Medicine

## 2017-08-21 ENCOUNTER — Other Ambulatory Visit: Payer: Self-pay | Admitting: Family Medicine

## 2017-08-21 MED ORDER — LEVOTHYROXINE SODIUM 50 MCG PO TABS: 50.0000 ug | ORAL_TABLET | Freq: Every morning | ORAL | 0 refills | Status: DC

## 2017-08-21 MED ORDER — ALLOPURINOL 100 MG PO TABS: 200.0000 mg | ORAL_TABLET | Freq: Every day | ORAL | 1 refills | Status: DC

## 2017-08-21 NOTE — Telephone Encounter (Signed)
What is the name of the medication? Allopurinol and Levothyroxine  Have you contacted your pharmacy to request a refill? Yes   Which pharmacy would you like this sent to? [facility] in [place]    Patient notified that their request is being sent to the clinical staff for review and that they should receive a call once it is complete. If they do not receive a call within 24 hours they can check with their pharmacy or our office.

## 2017-08-21 NOTE — Telephone Encounter (Signed)
Has follow up with Ermalinda MemosBradshaw in Jan- Medication refilled until then.

## 2017-09-12 ENCOUNTER — Ambulatory Visit (INDEPENDENT_AMBULATORY_CARE_PROVIDER_SITE_OTHER): Payer: Medicare Other | Admitting: Family Medicine

## 2017-09-12 ENCOUNTER — Encounter: Payer: Self-pay | Admitting: Family Medicine

## 2017-09-12 VITALS — BP 136/73 | HR 65 | Temp 98.0°F | Ht 69.0 in | Wt 206.2 lb

## 2017-09-12 DIAGNOSIS — R0600 Dyspnea, unspecified: Secondary | ICD-10-CM

## 2017-09-12 DIAGNOSIS — M1A0710 Idiopathic chronic gout, right ankle and foot, without tophus (tophi): Secondary | ICD-10-CM | POA: Diagnosis not present

## 2017-09-12 DIAGNOSIS — E785 Hyperlipidemia, unspecified: Secondary | ICD-10-CM

## 2017-09-12 DIAGNOSIS — I10 Essential (primary) hypertension: Secondary | ICD-10-CM | POA: Diagnosis not present

## 2017-09-12 MED ORDER — BENAZEPRIL-HYDROCHLOROTHIAZIDE 10-12.5 MG PO TABS: 1.0000 | ORAL_TABLET | Freq: Every day | ORAL | 3 refills | Status: DC

## 2017-09-12 MED ORDER — METOPROLOL SUCCINATE ER 50 MG PO TB24: ORAL_TABLET | ORAL | 3 refills | Status: DC

## 2017-09-12 MED ORDER — ALLOPURINOL 300 MG PO TABS: 300.0000 mg | ORAL_TABLET | Freq: Every day | ORAL | 3 refills | Status: DC

## 2017-09-12 MED ORDER — ATORVASTATIN CALCIUM 20 MG PO TABS: 20.0000 mg | ORAL_TABLET | Freq: Every day | ORAL | 3 refills | Status: DC

## 2017-09-12 NOTE — Progress Notes (Signed)
   HPI  Patient presents today here for follow up chornic medical problems.   As well as 200 mg of allopurinol, tolerating well. No additional flares.  Breathing, cough Started with acute illness about 3 months ago, now continues to have cough and wheezing, wheezing is worse when lying and moving his head towards 90 degrees, no severe exertional dyspnea. Does have history of CAD status post stenting and CABG  HTN No chest pain or headaches Good medication compliance, needs refill.  Hyperlipidemia-tolerating Lipitor well   PMH: Smoking status noted ROS: Per HPI  Objective: BP 136/73   Pulse 65   Temp 98 F (36.7 C) (Oral)   Ht '5\' 9"'$  (1.753 m)   Wt 206 lb 3.2 oz (93.5 kg)   BMI 30.45 kg/m  Gen: NAD, alert, cooperative with exam HEENT: NCAT CV: RRR, good S1/S2, no murmur Resp: CTABL, no wheezes, non-labored Ext: 1+ pittng edema BL LE symmetric Neuro: Alert and oriented, No gross deficits  Assessment and plan:  #Dyspnea, cough Likely post viral syndrome, given 4 weeks of samples of Symbicort to try, will likely only be temporary. Also consider new onset CHF with his history, checking BNP and chest x-ray  #Hyperlipidemia Continue Lipitor, repeat labs, direct LDL LFTs last month were very good  #Gout Uric acid slightly above goal, tolerating allopurinol well, titrate to 300 mg, repeat labs in 2 months  # HTN Well controlled, no changes Labs up-to-date     Orders Placed This Encounter  Procedures  . DG Chest 2 View    Standing Status:   Future    Standing Expiration Date:   11/11/2018    Order Specific Question:   Reason for Exam (SYMPTOM  OR DIAGNOSIS REQUIRED)    Answer:   cough    Order Specific Question:   Preferred imaging location?    Answer:   Internal    Order Specific Question:   Radiology Contrast Protocol - do NOT remove file path    Answer:   [email or web address]  . LDL Cholesterol, Direct  . Brain  natriuretic peptide  . Uric acid    Standing Status:   Future    Standing Expiration Date:   09/12/2018  . CMP14+EGFR    Standing Status:   Future    Standing Expiration Date:   09/12/2018    Meds ordered this encounter  Medications  . allopurinol (ZYLOPRIM) 300 MG tablet    Sig: Take 1 tablet (300 mg total) by mouth daily.    Dispense:  90 tablet    Refill:  3    Please consider 90 day supplies to promote better adherence  . atorvastatin (LIPITOR) 20 MG tablet    Sig: Take 1 tablet (20 mg total) by mouth daily.    Dispense:  90 tablet    Refill:  3  . metoprolol succinate (TOPROL-XL) 50 MG 24 hr tablet    Sig: TAKE 1 TABLET (50 MG TOTAL) BY MOUTH EVERY MORNING.    Dispense:  90 tablet    Refill:  3  . benazepril-hydrochlorthiazide (LOTENSIN HCT) 10-12.5 MG tablet    Sig: Take 1 tablet by mouth daily.    Dispense:  90 tablet    Refill:  3    Needs to be seen before next refill    Laroy Apple, MD [facility] 09/12/2017, 1:26 PM

## 2017-09-12 NOTE — Patient Instructions (Signed)
Great to see you!  Try symbicort for 4 weeks, 2 puffs twice daily.   I have increased your uric acid to 300 mg. Lets plan to repeat labs in 2 months ( you can come back for just labs)

## 2017-09-13 LAB — LDL CHOLESTEROL, DIRECT: LDL DIRECT: 77 mg/dL (ref 0–99)

## 2017-09-13 LAB — BRAIN NATRIURETIC PEPTIDE: BNP: 29.6 pg/mL (ref 0.0–100.0)

## 2017-09-26 ENCOUNTER — Other Ambulatory Visit (INDEPENDENT_AMBULATORY_CARE_PROVIDER_SITE_OTHER): Payer: Medicare Other

## 2017-09-26 DIAGNOSIS — R05 Cough: Secondary | ICD-10-CM | POA: Diagnosis not present

## 2017-09-26 DIAGNOSIS — R0600 Dyspnea, unspecified: Secondary | ICD-10-CM

## 2017-10-24 ENCOUNTER — Other Ambulatory Visit: Payer: Self-pay | Admitting: Family Medicine

## 2017-11-19 ENCOUNTER — Other Ambulatory Visit: Payer: Self-pay | Admitting: Family Medicine

## 2017-11-19 NOTE — Telephone Encounter (Signed)
Wife aware per dpr and verbalizes understanding.  

## 2017-11-19 NOTE — Telephone Encounter (Signed)
Last thyroid 05/21/16

## 2017-11-21 ENCOUNTER — Ambulatory Visit (INDEPENDENT_AMBULATORY_CARE_PROVIDER_SITE_OTHER): Payer: Medicare Other | Admitting: Family Medicine

## 2017-11-21 ENCOUNTER — Encounter: Payer: Self-pay | Admitting: Family Medicine

## 2017-11-21 VITALS — BP 150/70 | HR 46 | Temp 97.1°F | Ht 69.0 in | Wt 210.8 lb

## 2017-11-21 DIAGNOSIS — I2510 Atherosclerotic heart disease of native coronary artery without angina pectoris: Secondary | ICD-10-CM | POA: Diagnosis not present

## 2017-11-21 DIAGNOSIS — R059 Cough, unspecified: Secondary | ICD-10-CM

## 2017-11-21 DIAGNOSIS — R05 Cough: Secondary | ICD-10-CM | POA: Diagnosis not present

## 2017-11-21 MED ORDER — FUROSEMIDE 20 MG PO TABS: 20.0000 mg | ORAL_TABLET | Freq: Every day | ORAL | 0 refills | Status: DC

## 2017-11-21 NOTE — Patient Instructions (Signed)
Come back in about 1 week

## 2017-11-21 NOTE — Progress Notes (Signed)
   HPI  Patient presents today here with shortness of breath.  Patient reports cough, wheezing, nasal congestion, irritated eyes, and shortness of breath.  He states his cough, wheezing, shortness of breath are worse at night.  He was seen for similar illness in January, BNP was normal at that time.  His creatinine was slightly elevated at that time with reduced GFR.  He is taking HCTZ/benazepril as prescribed. No obvious urine output that is altered with this.  Patient has history of CABG x2 followed by 2 stents a few years later. He is interested in seeing cardiology again.  Denies chest pain. Does have some leg swelling.   PMH: Smoking status noted ROS: Per HPI  Objective: BP (!) 150/70   Pulse (!) 46   Temp (!) 97.1 F (36.2 C) (Oral)   Ht 5\' 9"  (1.753 m)   Wt 210 lb 12.8 oz (95.6 kg)   SpO2 97%   BMI 31.13 kg/m  Gen: NAD, alert, cooperative with exam HEENT: NCAT, irritation and erythema around the eyes bilaterally, mild conjunctival injection bilaterally CV: RRR, good S1/S2, no murmur Resp: Nonlabored, good air movement, crackles bilateral bases Ext: No edema, warm Neuro: Alert and oriented, No gross deficits  Assessment and plan:  #Cough, CAD Patient likely is having seasonal allergies-recommended over-the-counter plain Zyrtec He has crackles in the bases bilaterally with 1+ pitting edema causing some concern for new onset CHF.  Given Lasix times 3 days, continue HCTZ for now.  If he has a good improvement and evidence of either systolic or diastolic CHF will likely DC HCTZ and start daily Aldactone or spironolactone. TTE BNP WNL 2 months ago Refer back to cardiology-appreciate the recommendations and evaluation.  Orders Placed This Encounter  Procedures  . Ambulatory referral to Cardiology    Referral Priority:   Routine    Referral Type:   Consultation    Referral Reason:   Specialty Services Required    Referred to Provider:   Rollene RotundaHochrein, James, MD   Requested Specialty:   Cardiology    Number of Visits Requested:   1  . ECHOCARDIOGRAM COMPLETE    Standing Status:   Future    Standing Expiration Date:   02/22/2019    Order Specific Question:   Where should this test be performed    Answer[facility]    Order Specific Question:   Perflutren DEFINITY (image enhancing agent) should be administered unless hypersensitivity or allergy exist    Answer:   Administer Perflutren    Order Specific Question:   Expected Date:    Answer:   1 week    Meds ordered this encounter  Medications  . furosemide (LASIX) 20 MG tablet    Sig: Take 1 tablet (20 mg total) by mouth daily.    Dispense:  30 tablet    Refill:  0    Murtis SinkSam Bradshaw, MD [facility] 11/21/2017, 12:29 PM

## 2017-11-22 ENCOUNTER — Encounter (HOSPITAL_COMMUNITY): Payer: Self-pay

## 2017-11-22 ENCOUNTER — Other Ambulatory Visit: Payer: Self-pay

## 2017-11-22 ENCOUNTER — Inpatient Hospital Stay (HOSPITAL_COMMUNITY)
Admission: EM | Admit: 2017-11-22 | Discharge: 2017-11-25 | DRG: 291 | Disposition: A | Discharge status: Home / Self Care | Payer: Medicare Other | Attending: Cardiology | Admitting: Cardiology

## 2017-11-22 ENCOUNTER — Emergency Department (HOSPITAL_COMMUNITY): Payer: Medicare Other

## 2017-11-22 DIAGNOSIS — I2510 Atherosclerotic heart disease of native coronary artery without angina pectoris: Secondary | ICD-10-CM | POA: Diagnosis present

## 2017-11-22 DIAGNOSIS — R001 Bradycardia, unspecified: Secondary | ICD-10-CM

## 2017-11-22 DIAGNOSIS — I5033 Acute on chronic diastolic (congestive) heart failure: Secondary | ICD-10-CM | POA: Diagnosis present

## 2017-11-22 DIAGNOSIS — N183 Chronic kidney disease, stage 3 (moderate): Secondary | ICD-10-CM | POA: Diagnosis not present

## 2017-11-22 DIAGNOSIS — Z800 Family history of malignant neoplasm of digestive organs: Secondary | ICD-10-CM

## 2017-11-22 DIAGNOSIS — Z951 Presence of aortocoronary bypass graft: Secondary | ICD-10-CM

## 2017-11-22 DIAGNOSIS — I110 Hypertensive heart disease with heart failure: Secondary | ICD-10-CM | POA: Diagnosis not present

## 2017-11-22 DIAGNOSIS — E039 Hypothyroidism, unspecified: Secondary | ICD-10-CM | POA: Diagnosis present

## 2017-11-22 DIAGNOSIS — Z7982 Long term (current) use of aspirin: Secondary | ICD-10-CM

## 2017-11-22 DIAGNOSIS — M109 Gout, unspecified: Secondary | ICD-10-CM | POA: Diagnosis present

## 2017-11-22 DIAGNOSIS — I130 Hypertensive heart and chronic kidney disease with heart failure and stage 1 through stage 4 chronic kidney disease, or unspecified chronic kidney disease: Principal | ICD-10-CM | POA: Diagnosis present

## 2017-11-22 DIAGNOSIS — E785 Hyperlipidemia, unspecified: Secondary | ICD-10-CM | POA: Diagnosis present

## 2017-11-22 DIAGNOSIS — I509 Heart failure, unspecified: Secondary | ICD-10-CM

## 2017-11-22 DIAGNOSIS — Z8249 Family history of ischemic heart disease and other diseases of the circulatory system: Secondary | ICD-10-CM

## 2017-11-22 DIAGNOSIS — Z955 Presence of coronary angioplasty implant and graft: Secondary | ICD-10-CM

## 2017-11-22 DIAGNOSIS — Z87891 Personal history of nicotine dependence: Secondary | ICD-10-CM

## 2017-11-22 DIAGNOSIS — J9811 Atelectasis: Secondary | ICD-10-CM | POA: Diagnosis not present

## 2017-11-22 DIAGNOSIS — I10 Essential (primary) hypertension: Secondary | ICD-10-CM

## 2017-11-22 DIAGNOSIS — R110 Nausea: Secondary | ICD-10-CM | POA: Diagnosis not present

## 2017-11-22 LAB — CBC
HCT: 42.5 % (ref 39.0–52.0)
HEMOGLOBIN: 14.1 g/dL (ref 13.0–17.0)
MCH: 30 pg (ref 26.0–34.0)
MCHC: 33.2 g/dL (ref 30.0–36.0)
MCV: 90.4 fL (ref 78.0–100.0)
PLATELETS: 127 10*3/uL — AB (ref 150–400)
RBC: 4.7 MIL/uL (ref 4.22–5.81)
RDW: 14.5 % (ref 11.5–15.5)
WBC: 9.6 10*3/uL (ref 4.0–10.5)

## 2017-11-22 LAB — BASIC METABOLIC PANEL
ANION GAP: 12 (ref 5–15)
BUN: 32 mg/dL — ABNORMAL HIGH (ref 6–20)
CO2: 20 mmol/L — ABNORMAL LOW (ref 22–32)
CREATININE: 2.33 mg/dL — AB (ref 0.61–1.24)
Calcium: 9.3 mg/dL (ref 8.9–10.3)
Chloride: 107 mmol/L (ref 101–111)
GFR, EST AFRICAN AMERICAN: 30 mL/min — AB (ref >60)
GFR, EST NON AFRICAN AMERICAN: 26 mL/min — AB (ref >60)
Glucose, Bld: 100 mg/dL — ABNORMAL HIGH (ref 65–99)
Potassium: 3.9 mmol/L (ref 3.5–5.1)
SODIUM: 139 mmol/L (ref 135–145)

## 2017-11-22 LAB — I-STAT TROPONIN, ED: Troponin i, poc: 0.01 ng/mL (ref 0.00–0.08)

## 2017-11-22 MED ORDER — FUROSEMIDE 10 MG/ML IJ SOLN
40.0000 mg | Freq: Once | INTRAMUSCULAR | Status: AC
  Administered 2017-11-23: 40 mg via INTRAVENOUS
  Filled 2017-11-22: qty 4

## 2017-11-22 NOTE — ED Triage Notes (Signed)
Patient from home for shortness of breath and bradycardia.  Denies any chest pain at this time.  On metoprolol and does cut in half yesterday.

## 2017-11-22 NOTE — ED Notes (Signed)
The pt  Reports that he saw his doctor 2-3 days ago and he had the same symptoms.  He is currently in bigeminy.

## 2017-11-22 NOTE — ED Provider Notes (Signed)
I have personally seen and examined the patient. I have reviewed the documentation on PMH/FH/Soc Hx. I have discussed the plan of care with the resident and patient.  I have reviewed and agree with the resident's documentation. Please see associated encounter note.  Briefly, the patient is a 77 y.o. male history of CAD status post CABG who presents to the emergency department with several weeks of dyspnea on exertion, lower extremity edema.  Noted to be bradycardiac with rates in the 30s and in bigeminy. No acute ischemia on EKG.  Patient with evidence of volume overload on exam.  Chest x-ray without evidence of pulmonary edema.  Patient is currently hemodynamically stable.  Past placed on patient.  Labs with acute on chronic renal insufficiency.  CBC without anemia.  Troponin negative.  Currently awaiting BNP.  Case was discussed with cardiology who evaluated the patient in the emergency department and will admit for further workup and management.   EKG Interpretation  Date/Time:  Friday November 22 2017 22:14:57 EDT Ventricular Rate:  72 PR Interval:  174 QRS Duration: 104 QT Interval:  466 QTC Calculation: 510 R Axis:   -24 Text Interpretation:  Sinus rhythm with frequent Premature ventricular complexes in a pattern of bigeminy Low voltage QRS Inferior infarct , age undetermined Cannot rule out Anterior infarct , age undetermined Prolonged QT Abnormal ECG Confirmed by Drema Pryardama, Pedro [phone]) on 11/22/2017 10:24:46 PM      CRITICAL CARE Performed by: Amadeo GarnetPedro Eduardo Cardama Total critical care time: 45 minutes Critical care time was exclusive of separately billable procedures and treating other patients. Critical care was necessary to treat or prevent imminent or life-threatening deterioration. Critical care was time spent personally by me on the following activities: development of treatment plan with patient and/or surrogate as well as nursing, discussions with consultants, evaluation of patient's  response to treatment, examination of patient, obtaining history from patient or surrogate, ordering and performing treatments and interventions, ordering and review of laboratory studies, ordering and review of radiographic studies, pulse oximetry and re-evaluation of patient's condition.     Nira Connardama, Pedro Eduardo, MD 11/23/17 [phone]

## 2017-11-22 NOTE — ED Provider Notes (Signed)
[facility] EMERGENCY DEPARTMENT Provider Note   CSN: [id number] Arrival date & time: 11/22/17  2205     History   Chief Complaint Chief Complaint  Patient presents with  . Shortness of Breath  . Bradycardia    35    HPI Kevin Flores is a 77 y.o. male.  The history is provided by the patient and a relative.  Shortness of Breath  This is a new problem. The average episode lasts 3 weeks. The problem occurs continuously.The problem has been gradually worsening. Associated symptoms include cough, orthopnea and leg swelling. Pertinent negatives include no fever, no headaches, no sputum production, no wheezing, no chest pain, no syncope, no vomiting, no abdominal pain, no rash and no leg pain. Associated medical issues include CAD.  Patient reports 3 weeks of fatigue, exertional dyspnea, orthostatic dizziness, orthopnea, leg swelling.  PCP started him on Lasix recently.  He saw his family doctor yesterday which noted that his heart rate was in the 30s so his metoprolol dose was cut in half.  Patient's symptoms persisted today and his daughter-in-law who is a [employer] fundraiser checked his pulse rate was in the 30s and had him come back to the hospital.  Patient denies any chest pain but has complained of recurrent episodes of heartburn sensation the last few weeks.  Past Medical History:  Diagnosis Date  . Allergy    seasonal  . Arthritis   . CAD (coronary artery disease)    1998 LIMA to the LAD, SVG to circumflex. DES placed to the PDA 11/2009   . Gout   . Heartburn   . Hyperlipidemia   . Hypertension   . Nephrolithiasis    left  . Personal history of colonic polyps    adenomas  . Thyroid disease     Patient Active Problem List   Diagnosis Date Noted  . Plantar fasciitis 04/09/2017  . Achilles tendinitis of left lower extremity 04/09/2017  . Chronic gout of right ankle 02/12/2017  . Overweight (BMI 25.0-29.9) 05/21/2016  . Esophageal reflux 10/17/2015  . Metabolic  syndrome 10/17/2015  . Joint swelling 04/25/2015  . Vitamin D deficiency 04/06/2015  . Hypothyroidism 04/06/2015  . PALPITATIONS 12/21/2009  . Hyperlipidemia 12/04/2008  . Essential hypertension 12/04/2008  . Coronary atherosclerosis 12/04/2008  . SINUS BRADYCARDIA 12/04/2008    Past Surgical History:  Procedure Laterality Date  . BIOPSY PROSTATE    . cardiac stents  2001   and 40981  . COLONOSCOPY W/ POLYPECTOMY  2009   3 small adenomas  . CORONARY ARTERY BYPASS GRAFT  1998   2 bypass  . cysto with calculus manipulation    . UPPER GASTROINTESTINAL ENDOSCOPY         Home Medications    Prior to Admission medications   Medication Sig Start Date End Date Taking? Authorizing Provider  albuterol (PROVENTIL HFA;VENTOLIN HFA) 108 (90 BASE) MCG/ACT inhaler Inhale 2 puffs into the lungs every 6 (six) hours as needed for wheezing or shortness of breath. 08/19/13  Yes Haliburton, Mae E, FNP  allopurinol (ZYLOPRIM) 300 MG tablet Take 1 tablet (300 mg total) by mouth daily. 09/12/17  Yes [name], MD  amLODipine (NORVASC) 10 MG tablet TAKE 1 TABLET BY MOUTH ONCE DAILY (NEEDS  TO  BE  SEEN  BEFORE  NEXT  REFILL) Patient taking differently: Take 10 mg by mouth once a day 10/24/17  Yes [name], MD  aspirin 325 MG tablet Take 325 mg by mouth daily.  Yes [provider]  atorvastatin (LIPITOR) 20 MG tablet Take 1 tablet (20 mg total) by mouth daily. Patient taking differently: Take 20 mg by mouth at bedtime.  09/12/17  Yes Elenora GammaBradshaw, Samuel L, MD  benazepril-hydrochlorthiazide (LOTENSIN HCT) 10-12.5 MG tablet Take 1 tablet by mouth daily. 09/12/17  Yes Elenora GammaBradshaw, Samuel L, MD  Cholecalciferol (VITAMIN D-3 PO) Take 1 capsule by mouth daily.   Yes [provider]  fish oil-omega-3 fatty acids 1000 MG capsule Take 1 g by mouth at bedtime.    Yes [provider]  furosemide (LASIX) 20 MG tablet Take 1 tablet (20 mg total) by mouth daily. Patient taking  differently: Take 20 mg by mouth See admin instructions. Take 20 mg by mouth once a day for 3 days to remove excess fluid as directed by MD 11/21/17  Yes Elenora GammaBradshaw, Samuel L, MD  levothyroxine (SYNTHROID, LEVOTHROID) 50 MCG tablet TAKE 1 TABLET BY MOUTH ONCE DAILY IN THE MORNING Patient taking differently: Take 50 mcg by mouth once a day in the morning 11/19/17  Yes Elenora GammaBradshaw, Samuel L, MD  metoprolol succinate (TOPROL-XL) 50 MG 24 hr tablet TAKE 1 TABLET (50 MG TOTAL) BY MOUTH EVERY MORNING. Patient taking differently: Take 50 mg by mouth every morning.  09/12/17  Yes Elenora GammaBradshaw, Samuel L, MD  naproxen (NAPROSYN) 500 MG tablet Take 1 tablet (500 mg total) by mouth 2 (two) times daily with a meal. Patient taking differently: Take 500 mg by mouth 2 (two) times daily as needed (for gout flares/pain).  04/09/17  Yes Remus LofflerJones, Angel S, PA-C  vitamin C (ASCORBIC ACID) 500 MG tablet Take 500 mg by mouth daily.   Yes [provider]    Family History Family History  Problem Relation Age of Onset  . Colon cancer Brother     Social History Social History   Tobacco Use  . Smoking status: Former Smoker    Last attempt to quit: 12/19/1971    Years since quitting: 45.9  . Smokeless tobacco: Never Used  Substance Use Topics  . Alcohol use: No  . Drug use: No     Allergies   Rosuvastatin and Sulfonamide derivatives   Review of Systems Review of Systems  Constitutional: Positive for activity change and fatigue. Negative for chills and fever.  Eyes: Negative for pain and visual disturbance.  Respiratory: Positive for cough and shortness of breath. Negative for sputum production, chest tightness and wheezing.   Cardiovascular: Positive for orthopnea and leg swelling. Negative for chest pain, palpitations and syncope.  Gastrointestinal: Positive for nausea. Negative for abdominal distention, abdominal pain and vomiting.  Genitourinary: Negative for dysuria.  Musculoskeletal: Negative for back  pain.  Skin: Negative for rash.  Neurological: Positive for light-headedness. Negative for syncope, weakness, numbness and headaches.  Psychiatric/Behavioral: Negative for confusion.  All other systems reviewed and are negative.    Physical Exam Updated Vital Signs BP (!) 172/68   Pulse (!) 37   Temp 97.9 F (36.6 C) (Oral)   Resp (!) 25   SpO2 97%   Physical Exam  Constitutional: He is oriented to person, place, and time. He appears well-developed and well-nourished. He does not appear ill. No distress.  HENT:  Head: Normocephalic and atraumatic.  Mouth/Throat: Oropharynx is clear and moist.  Eyes: Conjunctivae are normal.  Neck: Neck supple.  Cardiovascular: Regular rhythm and intact distal pulses. Bradycardia present.  Murmur heard.  Systolic murmur is present with a grade of 2/6. Pulmonary/Chest: Effort normal and breath sounds  normal. Bradypnea noted. No respiratory distress. He has no decreased breath sounds. He has no wheezes. He has no rales.  Abdominal: Soft. He exhibits no distension. There is no tenderness. There is no guarding.  Musculoskeletal: He exhibits edema (2+ pitting edema).  Neurological: He is alert and oriented to person, place, and time.  Skin: Skin is warm and dry.  Psychiatric: He has a normal mood and affect.  Nursing note and vitals reviewed.    ED Treatments / Results  Labs (all labs ordered are listed, but only abnormal results are displayed) Labs Reviewed  BASIC METABOLIC PANEL - Abnormal; Notable for the following components:      Result Value   CO2 20 (*)    Glucose, Bld 100 (*)    BUN 32 (*)    Creatinine, Ser 2.33 (*)    GFR calc non Af Amer 26 (*)    GFR calc Af Amer 30 (*)    All other components within normal limits  CBC - Abnormal; Notable for the following components:   Platelets 127 (*)    All other components within normal limits  MAGNESIUM  TSH  T4, FREE  BRAIN NATRIURETIC PEPTIDE  I-STAT TROPONIN, ED    EKG  EKG  Interpretation  Date/Time:  Friday November 22 2017 22:14:57 EDT Ventricular Rate:  72 PR Interval:  174 QRS Duration: 104 QT Interval:  466 QTC Calculation: 510 R Axis:   -24 Text Interpretation:  Sinus rhythm with frequent Premature ventricular complexes in a pattern of bigeminy Low voltage QRS Inferior infarct , age undetermined Cannot rule out Anterior infarct , age undetermined Prolonged QT Abnormal ECG Confirmed by Drema Pry [phone]) on 11/22/2017 10:24:46 PM       Radiology Dg Chest Portable 1 View  Result Date: 11/22/2017 CLINICAL DATA:  Low heart rate and dizziness EXAM: PORTABLE CHEST 1 VIEW COMPARISON:  09/26/2016 FINDINGS: Stable cardiomegaly with aortic atherosclerosis. The patient is status post CABG. No acute pneumonic consolidation, effusion or pneumothorax. Minimal bibasilar atelectasis. Mild elevation of right hemidiaphragm appears stable. IMPRESSION: Stable cardiomegaly with aortic atherosclerosis. Status post CABG. No active pulmonary disease. Electronically Signed   By: Tollie Eth M.D.   On: 11/22/2017 22:50    Procedures Procedures (including critical care time)  Medications Ordered in ED Medications  furosemide (LASIX) injection 40 mg (not administered)     Initial Impression / Assessment and Plan / ED Course  I have reviewed the triage vital signs and the nursing notes.  Pertinent labs & imaging results that were available during my care of the patient were reviewed by me and considered in my medical decision making (see chart for details).     Patient is a 77 year old male with history of CAD, arthritis, gout, heartburn, hyperlipidemia, hypertension, thyroid disease who presents with 3 weeks of worsening shortness of breath and bradycardia.  Refer to HPI as above.  On exam, patient with pulse rate in the 30s with bigeminy on the monitor. Patient does not appear to be conducting the PVC beats.  Blood pressure is mildly hypertensive.  Patient does feel  lightheaded upon sitting up.  He denies any current chest pain or shortness of breath.  He has 2+ pitting edema in his lower extremities.  Mentation is normal.  Zoll pads placed on the patient's chest as a precautionary measure.  Patient did not need atropine or pacing given his normal mentation and stable blood pressure.  Workup as above significant for acute on chronic renal disease.  Creatinine is up to 2.33 from the meds once.  Troponin is negative.  EKG shows sinus rhythm with frequent PVCs and bigeminy pattern.  No obvious ischemia.  Cardiologist consulted for CHF exacerbation, symptomatic bradycardia for admission.  Cardiology will admit the patient.  Final Clinical Impressions(s) / ED Diagnoses   Final diagnoses:  Symptomatic bradycardia  Congestive heart failure, unspecified HF chronicity, unspecified heart failure type [facility])    ED Discharge Orders    None       Dwana Melena, DO 11/23/17 0045

## 2017-11-22 NOTE — ED Notes (Signed)
Feeling lightheaded for several days

## 2017-11-23 DIAGNOSIS — Z87891 Personal history of nicotine dependence: Secondary | ICD-10-CM | POA: Diagnosis not present

## 2017-11-23 DIAGNOSIS — Z8249 Family history of ischemic heart disease and other diseases of the circulatory system: Secondary | ICD-10-CM | POA: Diagnosis not present

## 2017-11-23 DIAGNOSIS — I509 Heart failure, unspecified: Secondary | ICD-10-CM | POA: Diagnosis not present

## 2017-11-23 DIAGNOSIS — M109 Gout, unspecified: Secondary | ICD-10-CM | POA: Diagnosis present

## 2017-11-23 DIAGNOSIS — Z800 Family history of malignant neoplasm of digestive organs: Secondary | ICD-10-CM | POA: Diagnosis not present

## 2017-11-23 DIAGNOSIS — I130 Hypertensive heart and chronic kidney disease with heart failure and stage 1 through stage 4 chronic kidney disease, or unspecified chronic kidney disease: Secondary | ICD-10-CM | POA: Diagnosis present

## 2017-11-23 DIAGNOSIS — E039 Hypothyroidism, unspecified: Secondary | ICD-10-CM | POA: Diagnosis present

## 2017-11-23 DIAGNOSIS — Z7982 Long term (current) use of aspirin: Secondary | ICD-10-CM | POA: Diagnosis not present

## 2017-11-23 DIAGNOSIS — I499 Cardiac arrhythmia, unspecified: Secondary | ICD-10-CM | POA: Diagnosis not present

## 2017-11-23 DIAGNOSIS — N183 Chronic kidney disease, stage 3 (moderate): Secondary | ICD-10-CM | POA: Diagnosis present

## 2017-11-23 DIAGNOSIS — E785 Hyperlipidemia, unspecified: Secondary | ICD-10-CM | POA: Diagnosis present

## 2017-11-23 DIAGNOSIS — Z955 Presence of coronary angioplasty implant and graft: Secondary | ICD-10-CM | POA: Diagnosis not present

## 2017-11-23 DIAGNOSIS — I2510 Atherosclerotic heart disease of native coronary artery without angina pectoris: Secondary | ICD-10-CM | POA: Diagnosis present

## 2017-11-23 DIAGNOSIS — I25810 Atherosclerosis of coronary artery bypass graft(s) without angina pectoris: Secondary | ICD-10-CM | POA: Diagnosis not present

## 2017-11-23 DIAGNOSIS — R001 Bradycardia, unspecified: Secondary | ICD-10-CM | POA: Diagnosis present

## 2017-11-23 DIAGNOSIS — I5033 Acute on chronic diastolic (congestive) heart failure: Secondary | ICD-10-CM | POA: Diagnosis not present

## 2017-11-23 DIAGNOSIS — Z951 Presence of aortocoronary bypass graft: Secondary | ICD-10-CM | POA: Diagnosis not present

## 2017-11-23 LAB — CREATININE, SERUM
Creatinine, Ser: 2.36 mg/dL — ABNORMAL HIGH (ref 0.61–1.24)
GFR calc non Af Amer: 26 mL/min — ABNORMAL LOW (ref >60)
GFR, EST AFRICAN AMERICAN: 30 mL/min — AB (ref >60)

## 2017-11-23 LAB — CBC
HEMATOCRIT: 43 % (ref 39.0–52.0)
HEMOGLOBIN: 14.7 g/dL (ref 13.0–17.0)
MCH: 30.9 pg (ref 26.0–34.0)
MCHC: 34.2 g/dL (ref 30.0–36.0)
MCV: 90.5 fL (ref 78.0–100.0)
Platelets: 134 10*3/uL — ABNORMAL LOW (ref 150–400)
RBC: 4.75 MIL/uL (ref 4.22–5.81)
RDW: 14.9 % (ref 11.5–15.5)
WBC: 9.4 10*3/uL (ref 4.0–10.5)

## 2017-11-23 LAB — CBC WITH DIFFERENTIAL/PLATELET
BASOS PCT: 0 %
Basophils Absolute: 0 10*3/uL (ref 0.0–0.1)
EOS PCT: 3 %
Eosinophils Absolute: 0.3 10*3/uL (ref 0.0–0.7)
HEMATOCRIT: 43.1 % (ref 39.0–52.0)
HEMOGLOBIN: 14.5 g/dL (ref 13.0–17.0)
LYMPHS PCT: 17 %
Lymphs Abs: 1.6 10*3/uL (ref 0.7–4.0)
MCH: 30.5 pg (ref 26.0–34.0)
MCHC: 33.6 g/dL (ref 30.0–36.0)
MCV: 90.5 fL (ref 78.0–100.0)
MONOS PCT: 9 %
Monocytes Absolute: 0.8 10*3/uL (ref 0.1–1.0)
NEUTROS PCT: 71 %
Neutro Abs: 6.6 10*3/uL (ref 1.7–7.7)
PLATELETS: 126 10*3/uL — AB (ref 150–400)
RBC: 4.76 MIL/uL (ref 4.22–5.81)
RDW: 14.8 % (ref 11.5–15.5)
WBC: 9.3 10*3/uL (ref 4.0–10.5)

## 2017-11-23 LAB — MAGNESIUM: Magnesium: 3.2 mg/dL — ABNORMAL HIGH (ref 1.7–2.4)

## 2017-11-23 LAB — TROPONIN I
Troponin I: <0.03 ng/mL (ref <0.03)
Troponin I: <0.03 ng/mL (ref <0.03)
Troponin I: <0.03 ng/mL (ref <0.03)

## 2017-11-23 LAB — BRAIN NATRIURETIC PEPTIDE: B Natriuretic Peptide: 564.4 pg/mL — ABNORMAL HIGH (ref 0.0–100.0)

## 2017-11-23 MED ORDER — ATORVASTATIN CALCIUM 40 MG PO TABS
40.0000 mg | ORAL_TABLET | Freq: Every day | ORAL | Status: DC
  Administered 2017-11-23 – 2017-11-24 (×2): 40 mg via ORAL
  Filled 2017-11-23 (×3): qty 1

## 2017-11-23 MED ORDER — ALBUTEROL SULFATE HFA 108 (90 BASE) MCG/ACT IN AERS: 2.0000 | INHALATION_SPRAY | Freq: Four times a day (QID) | RESPIRATORY_TRACT | Status: DC | PRN

## 2017-11-23 MED ORDER — SODIUM CHLORIDE 0.9% FLUSH: 3.0000 mL | INTRAVENOUS | Status: DC | PRN

## 2017-11-23 MED ORDER — ATROPINE SULFATE 1 MG/10ML IJ SOSY: 1.0000 mg | PREFILLED_SYRINGE | Freq: Once | INTRAMUSCULAR | Status: DC | PRN

## 2017-11-23 MED ORDER — AMLODIPINE BESYLATE 10 MG PO TABS
10.0000 mg | ORAL_TABLET | Freq: Every day | ORAL | Status: DC
  Administered 2017-11-23 – 2017-11-25 (×3): 10 mg via ORAL
  Filled 2017-11-23: qty 2
  Filled 2017-11-23 (×2): qty 1

## 2017-11-23 MED ORDER — ASPIRIN 81 MG PO CHEW
81.0000 mg | CHEWABLE_TABLET | Freq: Every day | ORAL | Status: DC
  Administered 2017-11-23 – 2017-11-25 (×3): 81 mg via ORAL
  Filled 2017-11-23 (×3): qty 1

## 2017-11-23 MED ORDER — POLYVINYL ALCOHOL 1.4 % OP SOLN
1.0000 [drp] | OPHTHALMIC | Status: DC | PRN
  Filled 2017-11-23: qty 15

## 2017-11-23 MED ORDER — HEPARIN SODIUM (PORCINE) 5000 UNIT/ML IJ SOLN
5000.0000 [IU] | Freq: Three times a day (TID) | INTRAMUSCULAR | Status: DC
  Administered 2017-11-23 – 2017-11-25 (×7): 5000 [IU] via SUBCUTANEOUS
  Filled 2017-11-23 (×7): qty 1

## 2017-11-23 MED ORDER — LEVOTHYROXINE SODIUM 50 MCG PO TABS
50.0000 ug | ORAL_TABLET | Freq: Every day | ORAL | Status: DC
  Administered 2017-11-24 – 2017-11-25 (×2): 50 ug via ORAL
  Filled 2017-11-23 (×3): qty 1

## 2017-11-23 MED ORDER — ONDANSETRON HCL 4 MG/2ML IJ SOLN: 4.0000 mg | Freq: Four times a day (QID) | INTRAMUSCULAR | Status: DC | PRN

## 2017-11-23 MED ORDER — MAGNESIUM SULFATE 2 GM/50ML IV SOLN
2.0000 g | Freq: Once | INTRAVENOUS | Status: AC
  Administered 2017-11-23: 2 g via INTRAVENOUS
  Filled 2017-11-23: qty 50

## 2017-11-23 MED ORDER — SODIUM CHLORIDE 0.9% FLUSH
3.0000 mL | Freq: Two times a day (BID) | INTRAVENOUS | Status: DC
  Administered 2017-11-23 – 2017-11-25 (×6): 3 mL via INTRAVENOUS

## 2017-11-23 MED ORDER — ALBUTEROL SULFATE (2.5 MG/3ML) 0.083% IN NEBU: 2.5000 mg | INHALATION_SOLUTION | Freq: Four times a day (QID) | RESPIRATORY_TRACT | Status: DC | PRN

## 2017-11-23 MED ORDER — SODIUM CHLORIDE 0.9 % IV SOLN: 250.0000 mL | INTRAVENOUS | Status: DC | PRN

## 2017-11-23 MED ORDER — ACETAMINOPHEN 325 MG PO TABS: 650.0000 mg | ORAL_TABLET | ORAL | Status: DC | PRN

## 2017-11-23 NOTE — ED Notes (Signed)
Pt sleeping intermittently 

## 2017-11-23 NOTE — Progress Notes (Signed)
Called RRT to inform of pt status and medical history  RRT will follow up throughout the night  Primary RN aware

## 2017-11-23 NOTE — Progress Notes (Signed)
Patient admitted from ED at 1630.  Patient has order for pacemaker to bedside, and to place patient on zoll monitor.  Nurse called and talked to on call MD and notified MD of the order, and for need to transfer patient to progressive care or discontinue pacemaker order if patient is to stay in the unit.  MD states he will address the order.  Nurse awaiting further instruction from MD.  Patient bradycardic and on ventricular bigeminy in the monitor without any symptoms at this time. Elnita MaxwellSalome N Niyla Marone, RN

## 2017-11-23 NOTE — Progress Notes (Signed)
   Seen earlier today by our service. See consult note for recommendations.  Awaiting echo.  Will see again in AM.

## 2017-11-23 NOTE — ED Notes (Signed)
Heart Healthy Lunch Tray Ordered @ 1053-per RN-called by Faith Branan  

## 2017-11-23 NOTE — H&P (Signed)
Cardiology Admission History and Physical:   Patient ID: Kevin Flores; MRN: [id number]; DOB: [birth year]   Admission date: 11/22/2017  Primary Care Provider: [name], MD Primary Cardiologist: Hochrein  Chief Complaint:  Dyspnea  Patient Profile:   Kevin Flores is a 77 y.o. male with a hx of CAD s/p CABG an subsequent PCI, HTN, HLD, arthritis who is admitted for bradycardia and suspected heart failure.  History of Present Illness:   Kevin Flores is a 76 y.o. male with a hx of CAD s/p CABG an subsequent PCI, HTN, HLD, arthritis who is admitted for bradycardia and suspected heart failure.  He was last seen in cardiology clinic by Dr. Antoine Poche in 2013, at which time he was doing well without any cardiac complaints.   He was in his normal state of health until two months ago when he developed cough with progressive dyspnea that was worse with exertion and laying flat. He reported these symptoms to his PCP who initially treated him for possible viral infection. BNP was checked and found to be normal at that time. He presented back to his PCP yesterday with ongoing dyspnea as well as LE edema. He was noted to be bradycardic to the 40s, and his metoprolol dose was reduced. Due to concern for new onset heart failure, he was prescribed furosemide and TTE was ordered.  This evening, he presented to the ED with worsening dyspnea. In the ED, pt in bigeminy with HR in 70s (although PVCs not perfusing when radial pulse palpated), BP 170/70. Labs notable for Cr 2.33 (baseline 1.6-1.8). Troponin negative x1, BNP elevated at 564. CXR showed cardiomegaly with mild pulmonary edema.  On my evaluation, the patient is resting comfortably in bed. He states that he has had dyspnea over the past several months with minor, intermittent chest discomfort that he feels to be different than his prior angina. He states that he has gained 8 lbs from his baseline weight. He has felt lightheaded intermittently  when standing but denies any syncope or presyncope. He otherwise states that he has felt weak with decreased energy but denies other complaints.   Past Medical History:  Diagnosis Date  . Allergy    seasonal  . Arthritis   . CAD (coronary artery disease)    1998 LIMA to the LAD, SVG to circumflex. DES placed to the PDA 11/2009   . Gout   . Heartburn   . Hyperlipidemia   . Hypertension   . Nephrolithiasis    left  . Personal history of colonic polyps    adenomas  . Thyroid disease     Past Surgical History:  Procedure Laterality Date  . BIOPSY PROSTATE    . cardiac stents  2001   and 40981  . COLONOSCOPY W/ POLYPECTOMY  2009   3 small adenomas  . CORONARY ARTERY BYPASS GRAFT  1998   2 bypass  . cysto with calculus manipulation    . UPPER GASTROINTESTINAL ENDOSCOPY       Medications Prior to Admission: Prior to Admission medications   Medication Sig Start Date End Date Taking? Authorizing Provider  albuterol (PROVENTIL HFA;VENTOLIN HFA) 108 (90 BASE) MCG/ACT inhaler Inhale 2 puffs into the lungs every 6 (six) hours as needed for wheezing or shortness of breath. 08/19/13  Yes Haliburton, Mae E, FNP  allopurinol (ZYLOPRIM) 300 MG tablet Take 1 tablet (300 mg total) by mouth daily. 09/12/17  Yes [name], MD  amLODipine (NORVASC) 10 MG tablet TAKE 1 TABLET BY  MOUTH ONCE DAILY (NEEDS  TO  BE  SEEN  BEFORE  NEXT  REFILL) Patient taking differently: Take 10 mg by mouth once a day 10/24/17  Yes Elenora GammaBradshaw, Samuel L, MD  aspirin 325 MG tablet Take 325 mg by mouth daily.     Yes [provider]  atorvastatin (LIPITOR) 20 MG tablet Take 1 tablet (20 mg total) by mouth daily. Patient taking differently: Take 20 mg by mouth at bedtime.  09/12/17  Yes Elenora GammaBradshaw, Samuel L, MD  benazepril-hydrochlorthiazide (LOTENSIN HCT) 10-12.5 MG tablet Take 1 tablet by mouth daily. 09/12/17  Yes Elenora GammaBradshaw, Samuel L, MD  Cholecalciferol (VITAMIN D-3 PO) Take 1 capsule by mouth daily.   Yes  [provider]  fish oil-omega-3 fatty acids 1000 MG capsule Take 1 g by mouth at bedtime.    Yes [provider]  furosemide (LASIX) 20 MG tablet Take 1 tablet (20 mg total) by mouth daily. Patient taking differently: Take 20 mg by mouth See admin instructions. Take 20 mg by mouth once a day for 3 days to remove excess fluid as directed by MD 11/21/17  Yes Elenora GammaBradshaw, Samuel L, MD  levothyroxine (SYNTHROID, LEVOTHROID) 50 MCG tablet TAKE 1 TABLET BY MOUTH ONCE DAILY IN THE MORNING Patient taking differently: Take 50 mcg by mouth once a day in the morning 11/19/17  Yes Elenora GammaBradshaw, Samuel L, MD  metoprolol succinate (TOPROL-XL) 50 MG 24 hr tablet TAKE 1 TABLET (50 MG TOTAL) BY MOUTH EVERY MORNING. Patient taking differently: Take 50 mg by mouth every morning.  09/12/17  Yes Elenora GammaBradshaw, Samuel L, MD  naproxen (NAPROSYN) 500 MG tablet Take 1 tablet (500 mg total) by mouth 2 (two) times daily with a meal. Patient taking differently: Take 500 mg by mouth 2 (two) times daily as needed (for gout flares/pain).  04/09/17  Yes Remus LofflerJones, Angel S, PA-C  vitamin C (ASCORBIC ACID) 500 MG tablet Take 500 mg by mouth daily.   Yes [provider]     Allergies:    Allergies  Allergen Reactions  . Rosuvastatin Other (See Comments)    SEVERE muscle and leg cramps/aches  . Sulfonamide Derivatives Rash and Other (See Comments)    Also "made the whites of my eyes turn yellow"    Social History:   Social History   Socioeconomic History  . Marital status: Married    Spouse name: Not on file  . Number of children: Not on file  . Years of education: Not on file  . Highest education level: Not on file  Social Needs  . Financial resource strain: Not on file  . Food insecurity - worry: Not on file  . Food insecurity - inability: Not on file  . Transportation needs - medical: Not on file  . Transportation needs - non-medical: Not on file  Occupational History  . Not on file  Tobacco Use  .  Smoking status: Former Smoker    Last attempt to quit: 12/19/1971    Years since quitting: 45.9  . Smokeless tobacco: Never Used  Substance and Sexual Activity  . Alcohol use: No  . Drug use: No  . Sexual activity: Not on file  Other Topics Concern  . Not on file  Social History Narrative  . Not on file    Family History:   The patient's family history includes Colon cancer in his brother.  MI in brother  ROS:  Please see the history of present illness.  All other ROS reviewed and negative.  Physical Exam/Data:   Vitals:   11/23/17 0045 11/23/17 0115 11/23/17 0130 11/23/17 0145  BP: (!) 151/65 (!) 149/61 (!) 144/67 (!) 144/60  Pulse: (!) 35 (!) 37 (!) 35 (!) 35  Resp: (!) 23 16 20  (!) 22  Temp:      TempSrc:      SpO2: 96% 92% 97% 97%    Intake/Output Summary (Last 24 hours) at 11/23/2017 0234 Last data filed at 11/23/2017 0152 Gross per 24 hour  Intake -  Output 225 ml  Net -225 ml   There were no vitals filed for this visit. There is no height or weight on file to calculate BMI.  General:  Well nourished, well developed, in no acute distress HEENT: normal Neck: JVD at jaw at 45 degrees Cardiac:  Bradycardic with distant heart sounds and II/VI systolic murmur Lungs:  Few coarse breath sounds. Normal work of breathing Abd: soft, nontender, non-distended Ext: 1-2+ pitting LE edema Musculoskeletal:  No deformities, BUE and BLE strength normal and equal Skin: warm and dry  Neuro:  No focal abnormalities noted Psych:  Normal affect    EKG:  The ECG that was done and was personally reviewed and demonstrates sinus rhythm with bigeminy, HR 68. Nonspecific ST changes with sinus beats.   Relevant CV Studies: No recent  Laboratory Data:  Chemistry Recent Labs  Lab 11/22/17 2226  NA 139  K 3.9  CL 107  CO2 20*  GLUCOSE 100*  BUN 32*  CREATININE 2.33*  CALCIUM 9.3  GFRNONAA 26*  GFRAA 30*  ANIONGAP 12    No results for input(s): PROT, ALBUMIN, AST,  ALT, ALKPHOS, BILITOT in the last 168 hours. Hematology Recent Labs  Lab 11/22/17 2226  WBC 9.6  RBC 4.70  HGB 14.1  HCT 42.5  MCV 90.4  MCH 30.0  MCHC 33.2  RDW 14.5  PLT 127*   Cardiac EnzymesNo results for input(s): TROPONINI in the last 168 hours.  Recent Labs  Lab 11/22/17 2239  TROPIPOC 0.01    BNP Recent Labs  Lab 11/22/17 2255  BNP 564.4*    DDimer No results for input(s): DDIMER in the last 168 hours.  Radiology/Studies:  Dg Chest Portable 1 View  Result Date: 11/22/2017 CLINICAL DATA:  Low heart rate and dizziness EXAM: PORTABLE CHEST 1 VIEW COMPARISON:  09/26/2016 FINDINGS: Stable cardiomegaly with aortic atherosclerosis. The patient is status post CABG. No acute pneumonic consolidation, effusion or pneumothorax. Minimal bibasilar atelectasis. Mild elevation of right hemidiaphragm appears stable. IMPRESSION: Stable cardiomegaly with aortic atherosclerosis. Status post CABG. No active pulmonary disease. Electronically Signed   By: Tollie Eth M.D.   On: 11/22/2017 22:50    Assessment and Plan:   Dyspnea The patient presents to the ED with several months of progressive dyspnea. He has elevated BNP and signs of hypervolemia on exam, consistent with new diagnosis of heart failure. The cause of his apparent heart failure is not clear: He has known CAD and therefore may have ischemic cardiomyopathy, although his clinical presentation is not consistent with ACS. Given his bigeminy on presentation, arrhythmia mediated cardiomyopathy may be possible (although this may also be a secondary finding). At this time, will plan for empiric diuresis. He will need further evaluation for progression of his CAD along with further optimization of heart failure, based on results of workup.  -Echocardiogram ordered -S/p 40 mg IV furosemide in the ED. Monitor response and redose as appropriate. Holding home HCTZ while giving IV diuretic. -Holding home metoprolol in  the setting of  bradycardia. -Holding home benazepril in the setting of AKI.  Ventricular bigeminy Bradycardia The patient was found to have bradycardia to the 40s by his PCP, for which his metoprolol was decreased. He is noted to be in ventricular bigeminy in the ED with HR in the 70s, although his PVCs are not perfusing and he is effectively bradycardic. He has some mild lightheadedness that sounds to be more consistent with orthostasis than true symptomatic bradycardia. As above, it is unclear if his ventricular ectopy may be a primary contributing factor to his cardiomyopathy vs a secondary effect. Evaluation for worsening CAD must also be assessed as a cause of his ventricular ectopy. Management of his arrhythmia is challenging, as ventricular ectopy is often improved with nodal blockade, although his effective bradycardia makes nodal blockade relatively contraindicated. -Continue to monitor on telemetry. -Holding nodal blockade at this time.  -Continue to work up heart failure and possible progressive ischemia. Can determine appropriate management for bradycardia/VE if no reversible cause is found.  CAD s/p CABG (1998) with subsequent PCI The patient has a history of significant vascular disease. As above, he has no significant burden of chest pain, and initial troponin is negative. Therefore, his presentation is unlikely consistent with ACS, although his decompensation may be due to progressive ischemia. -Continue to monitor symptoms -Will likely need repeat LHC or stress test prior to discharge -Decreasing ASA to 81 mg daily -Increasing home atorvastatin to 40 mg daily -Holding metoprolol as above  AoCKD Cr 2.33 on admission, which is increased from baseline of 1.6-1.9. Suspect renal congestion due to volume overload. -Diuresis as above.  HTN BP elevated in the ED, which may be elevated reflexively in the setting of bradycardia. -Continue home amlodipine -Holding metoprolol and ACEI and HCTZ as  above -Can add further therapies as needed  HLD -Continue atorvastatin as above  Gout Arthritis -Holding allopurinol for now in the setting of evolving renal function.   Hypothyroidism -Continue levothyroxine  Severity of Illness: The appropriate patient status for this patient is INPATIENT. Inpatient status is judged to be reasonable and necessary in order to provide the required intensity of service to ensure the patient's safety. The patient's presenting symptoms, physical exam findings, and initial radiographic and laboratory data in the context of their chronic comorbidities is felt to place them at high risk for further clinical deterioration. Furthermore, it is not anticipated that the patient will be medically stable for discharge from the hospital within 2 midnights of admission. The following factors support the patient status of inpatient.   " The patient's presenting symptoms include dyspnea. " The worrisome physical exam findings include volume overlaod. " The initial radiographic and laboratory data are worrisome because of AKI. " The chronic co-morbidities include CAD.   * I certify that at the point of admission it is my clinical judgment that the patient will require inpatient hospital care spanning beyond 2 midnights from the point of admission due to high intensity of service, high risk for further deterioration and high frequency of surveillance required.*    For questions or updates, please contact [facility] Please consult [email or web address] for contact info under Cardiology/STEMI.    Signed, Ernest Mallick, MD  11/23/2017 2:34 AM

## 2017-11-23 NOTE — Significant Event (Signed)
Rapid Response Event Note  RN called for "second set of eyes" on patient.  Patient was in SR with periods of ventricular bigemy. When I arrived, HR was in the 70s-80s, patient was neuro intact, awake, denied any discomfort and stated he feels much better.  Patient stated he was able to ambulate in the room and he denied being dizzy/lightheaded, + pulses, and was skin warm and dry. VSS  NO RRT INTERVENTIONS

## 2017-11-23 NOTE — ED Notes (Signed)
Admitting doctor at the bedside 

## 2017-11-24 ENCOUNTER — Inpatient Hospital Stay (HOSPITAL_COMMUNITY): Payer: Medicare Other

## 2017-11-24 LAB — CBC WITH DIFFERENTIAL/PLATELET
Basophils Absolute: 0 10*3/uL (ref 0.0–0.1)
Basophils Relative: 0 %
EOS ABS: 0.3 10*3/uL (ref 0.0–0.7)
EOS PCT: 4 %
HCT: 42 % (ref 39.0–52.0)
Hemoglobin: 14 g/dL (ref 13.0–17.0)
LYMPHS ABS: 1.6 10*3/uL (ref 0.7–4.0)
LYMPHS PCT: 19 %
MCH: 30.1 pg (ref 26.0–34.0)
MCHC: 33.3 g/dL (ref 30.0–36.0)
MCV: 90.3 fL (ref 78.0–100.0)
MONO ABS: 1.3 10*3/uL — AB (ref 0.1–1.0)
Monocytes Relative: 16 %
Neutro Abs: 5 10*3/uL (ref 1.7–7.7)
Neutrophils Relative %: 61 %
PLATELETS: 125 10*3/uL — AB (ref 150–400)
RBC: 4.65 MIL/uL (ref 4.22–5.81)
RDW: 14.6 % (ref 11.5–15.5)
WBC: 8.2 10*3/uL (ref 4.0–10.5)

## 2017-11-24 LAB — BASIC METABOLIC PANEL
Anion gap: 10 (ref 5–15)
BUN: 32 mg/dL — AB (ref 6–20)
CO2: 22 mmol/L (ref 22–32)
CREATININE: 2.11 mg/dL — AB (ref 0.61–1.24)
Calcium: 9 mg/dL (ref 8.9–10.3)
Chloride: 107 mmol/L (ref 101–111)
GFR calc non Af Amer: 29 mL/min — ABNORMAL LOW (ref >60)
GFR, EST AFRICAN AMERICAN: 34 mL/min — AB (ref >60)
Glucose, Bld: 101 mg/dL — ABNORMAL HIGH (ref 65–99)
Potassium: 4 mmol/L (ref 3.5–5.1)
SODIUM: 139 mmol/L (ref 135–145)

## 2017-11-24 LAB — ECHOCARDIOGRAM COMPLETE
Height: 69 in
WEIGHTICAEL: 3222.4 [oz_av]

## 2017-11-24 LAB — MAGNESIUM: Magnesium: 2.1 mg/dL (ref 1.7–2.4)

## 2017-11-24 MED ORDER — FUROSEMIDE 10 MG/ML IJ SOLN
20.0000 mg | Freq: Once | INTRAMUSCULAR | Status: AC
  Administered 2017-11-24: 20 mg via INTRAVENOUS
  Filled 2017-11-24: qty 2

## 2017-11-24 NOTE — Progress Notes (Addendum)
Progress Note  Patient Name: Kevin DrownJohnny Bellew Date of Encounter: 11/24/2017  Primary Cardiologist:   No primary care provider on file.   Subjective   He says he is breathing OK and back to baseline  Inpatient Medications    Scheduled Meds: . amLODipine  10 mg Oral Daily  . aspirin  81 mg Oral Daily  . atorvastatin  40 mg Oral q1800  . heparin  5,000 Units Subcutaneous Q8H  . levothyroxine  50 mcg Oral QAC breakfast  . sodium chloride flush  3 mL Intravenous Q12H   Continuous Infusions: . sodium chloride     PRN Meds: sodium chloride, acetaminophen, albuterol, atropine, ondansetron (ZOFRAN) IV, polyvinyl alcohol, sodium chloride flush   Vital Signs    Vitals:   11/24/17 0025 11/24/17 0515 11/24/17 0956 11/24/17 1127  BP: (!) 144/74 (!) 141/57 (!) 155/61 (!) 157/58  Pulse: 65 69 66 (!) 38  Resp: 16 16 18 18   Temp: 98.7 F (37.1 C) 98.2 F (36.8 C) 98.2 F (36.8 C) 98 F (36.7 C)  TempSrc: Oral Oral Oral Oral  SpO2: 98% 97% 94% 97%  Weight:  201 lb 6.4 oz (91.4 kg)    Height:        Intake/Output Summary (Last 24 hours) at 11/24/2017 1308 Last data filed at 11/24/2017 0950 Gross per 24 hour  Intake 360 ml  Output 900 ml  Net -540 ml   Filed Weights   11/23/17 1630 11/24/17 0515  Weight: 204 lb 1.6 oz (92.6 kg) 201 lb 6.4 oz (91.4 kg)    Telemetry    Ventricular bigeminy and NSR - Personally Reviewed  ECG    NA - Personally Reviewed  Physical Exam   GEN: No acute distress.   Neck: No  JVD Cardiac: RRR, NO murmurs, rubs, or gallops.  Respiratory: Clear  to auscultation bilaterally. GI: Soft, nontender, non-distended  MS: No  edema; No deformity. Neuro:  Nonfocal  Psych: Normal affect   Labs    Chemistry Recent Labs  Lab 11/22/17 2226 11/23/17 0312 11/24/17 0632  NA 139  --  139  K 3.9  --  4.0  CL 107  --  107  CO2 20*  --  22  GLUCOSE 100*  --  101*  BUN 32*  --  32*  CREATININE 2.33* 2.36* 2.11*  CALCIUM 9.3  --  9.0  GFRNONAA 26*  26* 29*  GFRAA 30* 30* 34*  ANIONGAP 12  --  10     Hematology Recent Labs  Lab 11/23/17 0312 11/23/17 0641 11/24/17 0632  WBC 9.4 9.3 8.2  RBC 4.75 4.76 4.65  HGB 14.7 14.5 14.0  HCT 43.0 43.1 42.0  MCV 90.5 90.5 90.3  MCH 30.9 30.5 30.1  MCHC 34.2 33.6 33.3  RDW 14.9 14.8 14.6  PLT 134* 126* 125*    Cardiac Enzymes Recent Labs  Lab 11/23/17 0312 11/23/17 0641 11/23/17 1637  TROPONINI <0.03 <0.03 <0.03    Recent Labs  Lab 11/22/17 2239  TROPIPOC 0.01     BNP Recent Labs  Lab 11/22/17 2255  BNP 564.4*     DDimer No results for input(s): DDIMER in the last 168 hours.   Radiology    Dg Chest Portable 1 View  Result Date: 11/22/2017 CLINICAL DATA:  Low heart rate and dizziness EXAM: PORTABLE CHEST 1 VIEW COMPARISON:  09/26/2016 FINDINGS: Stable cardiomegaly with aortic atherosclerosis. The patient is status post CABG. No acute pneumonic consolidation, effusion or pneumothorax. Minimal bibasilar atelectasis.  Mild elevation of right hemidiaphragm appears stable. IMPRESSION: Stable cardiomegaly with aortic atherosclerosis. Status post CABG. No active pulmonary disease. Electronically Signed   By: Tollie Eth M.D.   On: 11/22/2017 22:50    Cardiac Studies   Echo results pending  Patient Profile     77 y.o. male with a hx of CAD s/p CABG an subsequent PCI, HTN, HLD, arthritiswho is admitted for bradycardia and suspected heart failure.   Assessment & Plan    CKD STAGE III:   Creat is stable.  Follow closely.   ACUTE ON CHRONIC DIASTOLIC HF:   Negative 2.6 liters.   I will give IV Lasix again once today and then probably PO in AM pending his renal function.   VENTRICULAR BIGEMINY:  His pulse was listed as 35 by the primary care office but this was likely undercounted secondary to ventricular bigeminy.  However, he did not feel this and has had no presyncope or syncope.  Awaiting the echo results.    CAD:  Enzymes negative.  He has had some nighttime pain  in his chest but only when lying flat at night.    HTN:  BP is up today but his beta blocker is held secondary to the perceived low heart rate.  I will likely resume this pending the echo results  For questions or updates, please contact [facility] Please consult [email or web address] for contact info under Cardiology/STEMI.   Signed, Rollene Rotunda, MD  11/24/2017, 1:08 PM

## 2017-11-24 NOTE — Progress Notes (Signed)
Patient with no complaints or concerns during 7pm - 7am shift. No acute events overnight.   Liliane Mallis, RN

## 2017-11-24 NOTE — Progress Notes (Signed)
Pt. Sitting in bed on RA spo2 at 95% no needs at this time. Daughter at the bedside.

## 2017-11-24 NOTE — Progress Notes (Signed)
Pt resting in bed son at the bedside. Medications given. Pt taken off of O2. Educated on the need to wean off and to call if he feels SOB.

## 2017-11-24 NOTE — Progress Notes (Signed)
*  PRELIMINARY RESULTS* Echocardiogram 2D Echocardiogram has been performed.  Jeryl Columbialliott, Read Bonelli 11/24/2017, 9:03 AM

## 2017-11-25 ENCOUNTER — Other Ambulatory Visit: Payer: Self-pay | Admitting: Cardiology

## 2017-11-25 DIAGNOSIS — I5033 Acute on chronic diastolic (congestive) heart failure: Secondary | ICD-10-CM

## 2017-11-25 LAB — BASIC METABOLIC PANEL
Anion gap: 10 (ref 5–15)
BUN: 29 mg/dL — AB (ref 6–20)
CALCIUM: 9.2 mg/dL (ref 8.9–10.3)
CHLORIDE: 103 mmol/L (ref 101–111)
CO2: 25 mmol/L (ref 22–32)
CREATININE: 2.05 mg/dL — AB (ref 0.61–1.24)
GFR calc non Af Amer: 30 mL/min — ABNORMAL LOW (ref >60)
GFR, EST AFRICAN AMERICAN: 35 mL/min — AB (ref >60)
GLUCOSE: 108 mg/dL — AB (ref 65–99)
Potassium: 4.1 mmol/L (ref 3.5–5.1)
Sodium: 138 mmol/L (ref 135–145)

## 2017-11-25 LAB — CBC WITH DIFFERENTIAL/PLATELET
Basophils Absolute: 0 10*3/uL (ref 0.0–0.1)
Basophils Relative: 0 %
EOS PCT: 4 %
Eosinophils Absolute: 0.4 10*3/uL (ref 0.0–0.7)
HCT: 44.3 % (ref 39.0–52.0)
Hemoglobin: 14.7 g/dL (ref 13.0–17.0)
LYMPHS ABS: 2 10*3/uL (ref 0.7–4.0)
LYMPHS PCT: 22 %
MCH: 30 pg (ref 26.0–34.0)
MCHC: 33.2 g/dL (ref 30.0–36.0)
MCV: 90.4 fL (ref 78.0–100.0)
MONOS PCT: 10 %
Monocytes Absolute: 0.9 10*3/uL (ref 0.1–1.0)
Neutro Abs: 5.8 10*3/uL (ref 1.7–7.7)
Neutrophils Relative %: 64 %
PLATELETS: 134 10*3/uL — AB (ref 150–400)
RBC: 4.9 MIL/uL (ref 4.22–5.81)
RDW: 14.6 % (ref 11.5–15.5)
WBC: 9.2 10*3/uL (ref 4.0–10.5)

## 2017-11-25 LAB — MAGNESIUM: Magnesium: 2 mg/dL (ref 1.7–2.4)

## 2017-11-25 MED ORDER — FUROSEMIDE 20 MG PO TABS: 20.0000 mg | ORAL_TABLET | ORAL | 2 refills | Status: DC

## 2017-11-25 MED ORDER — AMLODIPINE BESYLATE 10 MG PO TABS: 10.0000 mg | ORAL_TABLET | Freq: Every day | ORAL | 3 refills | Status: DC

## 2017-11-25 MED ORDER — ASPIRIN 81 MG PO CHEW: 81.0000 mg | CHEWABLE_TABLET | Freq: Every day | ORAL | 3 refills | Status: AC

## 2017-11-25 MED ORDER — ATORVASTATIN CALCIUM 40 MG PO TABS: 40.0000 mg | ORAL_TABLET | Freq: Every day | ORAL | 3 refills | Status: DC

## 2017-11-25 MED ORDER — METOPROLOL SUCCINATE ER 25 MG PO TB24: 25.0000 mg | ORAL_TABLET | Freq: Every day | ORAL | 11 refills | Status: DC

## 2017-11-25 NOTE — Progress Notes (Signed)
Reviewed discharge instructions/medications with pt and pt's family member. Answered his questions. Pt is stable and ready for discharge.

## 2017-11-25 NOTE — Discharge Summary (Addendum)
Discharge Summary    Patient ID: Kevin Flores,  MRN: [id number], DOB/AGE: 09-17-1940 77 y.o.  Admit date: 11/22/2017 Discharge date: 11/25/2017  Primary Care Provider: [name] Primary Cardiologist: Rollene Rotunda, MD  Discharge Diagnoses    Active Problems:   Acute exacerbation of CHF (congestive heart failure) (HCC)   Allergies Allergies  Allergen Reactions  . Rosuvastatin Other (See Comments)    SEVERE muscle and leg cramps/aches  . Sulfonamide Derivatives Rash and Other (See Comments)    Also "made the whites of my eyes turn yellow"    Diagnostic Studies/Procedures    Echocardiogram 11/24/17 Study Conclusions  - Left ventricle: The cavity size was normal. Wall thickness was   increased in a pattern of moderate LVH. Systolic function was   normal. The estimated ejection fraction was in the range of 50% to 55%. Wall motion was normal; there were no regional wall motion abnormalities. - Aortic valve: There was mild regurgitation. - Mitral valve: There was moderate regurgitation directed   eccentrically. - Left atrium: The atrium was moderately dilated. - Tricuspid valve: There was mild-moderate regurgitation. - Pulmonary arteries: Systolic pressure was moderately increased. PA peak pressure: 51 mm Hg (S).   History of Present Illness     Kevin Joyceis a 77 y.o.malewithmalewith a hx of CAD s/p CABG an subsequent PCI, HTN, HLD, arthritiswho was admitted to [facility] on 11/22/17 for bradycardia and suspected heart failure.  He was last seen in cardiology clinic by Dr. Antoine Poche in 2013, at which time he was doing well without any cardiac complaints.   He was in his normal state of health until two months ago when he developed cough with progressive dyspnea that was worse with exertion and laying flat. He reported these symptoms to his PCP who initially treated him for possible viral infection. BNP was checked and found to be normal at that time. He  presented back to his PCP on 11/21/17 with ongoing dyspnea as well as LE edema. He was noted to be bradycardic to the 40s, and his metoprolol dose was reduced. Due to concern for new onset heart failure, he was prescribed furosemide and TTE was ordered.  On the evening of 11/22/17, he presented to the ED with worsening dyspnea. In the ED, pt in bigeminy with HR in 70s (although PVCs not perfusing when radial pulse palpated), BP 170/70. Labs notable for Cr 2.33 (baseline 1.6-1.8). Troponin negative x1, BNP elevated at 564. CXR showed cardiomegaly with mild pulmonary edema.  The patient stated that he has had dyspnea over the past several months with minor, intermittent chest discomfort that he feels to be different than his prior angina. He stated that he has gained 8 lbs from his baseline weight. He has felt lightheaded intermittently when standing but denies any syncope or presyncope. He otherwise states that he has felt weak with decreased energy but denies other complaints.    Hospital Course     Consultants: none  The patient was diuresed with Lasix 40 mg IV x1 and 20 mg IV x1.  He is net -3.2 L fluid balance.  Discharge weight is 198 pounds.  His breathing has improved. Patient will be discharged home on Lasix 20 mg p.o. every other day and 1500 mL fluid restriction and low-sodium diet.  The patient has been noted to have ventricular bigeminy, no pauses and currently no symptoms.  This was why his heart rate was recorded as 35 bpm.  He is okay to  go home on half of his usual dose of Toprol 50 mg every morning, for 25 mg.  Cardiac enzymes were negative. His wife has cancer and is starting radiation therapy today and very much wants to go home. We will plan for outpatient Lexiscan Myoview.  He understands that he would only get a cath if this demonstrates a high risk finding.    Blood pressure has been elevated.  We will restart his beta-blocker at half dose upon discharge.  His ace-inhibitor is  on hold due to renal function. The patient will keep a blood pressure diary and blood pressure will be reassessed at follow-up.  We will arrange follow-up with an APP or Dr. [facility].  He will have Bmet checked at primary care office later this week.  Creatinine has remained stable, 2.05 on day of discharge.   Patient has been seen by Dr. Antoine Flores today and deemed ready for discharge home. All follow up appointments have been scheduled. Discharge medications are listed below.  _____________  Discharge Vitals Blood pressure (!) 150/59, pulse 72, temperature 98.1 F (36.7 C), temperature source Oral, resp. rate 18, height 5\' 9"  (1.753 m), weight 198 lb 8 oz (90 kg), SpO2 92 %.  Filed Weights   11/23/17 1630 11/24/17 0515 11/25/17 0519  Weight: 204 lb 1.6 oz (92.6 kg) 201 lb 6.4 oz (91.4 kg) 198 lb 8 oz (90 kg)    Labs & Radiologic Studies    CBC Recent Labs    11/24/17 0632 11/25/17 0522  WBC 8.2 9.2  NEUTROABS 5.0 5.8  HGB 14.0 14.7  HCT 42.0 44.3  MCV 90.3 90.4  PLT 125* 134*   Basic Metabolic Panel Recent Labs    16/06/9602/17/19 0632 11/25/17 0522  NA 139 138  K 4.0 4.1  CL 107 103  CO2 22 25  GLUCOSE 101* 108*  BUN 32* 29*  CREATININE 2.11* 2.05*  CALCIUM 9.0 9.2  MG 2.1 2.0   Liver Function Tests No results for input(s): AST, ALT, ALKPHOS, BILITOT, PROT, ALBUMIN in the last 72 hours. No results for input(s): LIPASE, AMYLASE in the last 72 hours. Cardiac Enzymes Recent Labs    11/23/17 0312 11/23/17 0641 11/23/17 1637  TROPONINI <0.03 <0.03 <0.03   BNP Invalid input(s): POCBNP D-Dimer No results for input(s): DDIMER in the last 72 hours. Hemoglobin A1C No results for input(s): HGBA1C in the last 72 hours. Fasting Lipid Panel No results for input(s): CHOL, HDL, LDLCALC, TRIG, CHOLHDL, LDLDIRECT in the last 72 hours. Thyroid Function Tests No results for input(s): TSH, T4TOTAL, T3FREE, THYROIDAB in the  last 72 hours.  Invalid input(s): FREET3 _____________  Dg Chest Portable 1 View  Result Date: 11/22/2017 CLINICAL DATA:  Low heart rate and dizziness EXAM: PORTABLE CHEST 1 VIEW COMPARISON:  09/26/2016 FINDINGS: Stable cardiomegaly with aortic atherosclerosis. The patient is status post CABG. No acute pneumonic consolidation, effusion or pneumothorax. Minimal bibasilar atelectasis. Mild elevation of right hemidiaphragm appears stable. IMPRESSION: Stable cardiomegaly with aortic atherosclerosis. Status post CABG. No active pulmonary disease. Electronically Signed   By: Tollie Ethavid  Kwon M.D.   On: 11/22/2017 22:50   Disposition   Pt is being discharged home today in good condition.  Follow-up Plans & Appointments    Follow-up Information    [facility] Follow up.   Specialty:  Cardiology Why:  You have a cardiac stress test scheduled for Friday March [place]nd at [place].  See attached instructions.  Please call our office if this does not work for you and you need to reschedule.  Contact information: [address] [place] [address] [phone]       [name], MD Follow up.   Specialty:  Family Medicine Why:  Please follow up with your PCP this week. You will need labs done this week to check your kidney function.  Contact information: [address] [place] [address] [phone]        Rollene Rotunda, MD Follow up.   Specialty:  Cardiology Why:  Cardiology follow up appointment on April 4th at 3:00 with [name], PA. Please arrive 15 minutes early to check in. Please call ahead if you need to reschedule.   Bring blood pressure diary to this appointment. Contact information: [address] [place] [address] [phone]          Discharge Instructions    (HEART FAILURE PATIENTS) Call MD:  Anytime you have any of the following symptoms: 1) 3 pound weight gain in 24 hours or 5  pounds in 1 week 2) shortness of breath, with or without a dry hacking cough 3) swelling in the hands, feet or stomach 4) if you have to sleep on extra pillows at night in order to breathe.   Complete by:  As directed    Diet - low sodium heart healthy   Complete by:  As directed    Discharge instructions   Complete by:  As directed    Follow a low sodium diet less than 2000 mg of sodium per day.  Limit fluid intake to 1.5L, about 6 cups per day. Measure your blood pressure daily at different times of the day and record these. Bring your measurements to your follow up appointment.  _________________________________________________________  Bonita Quin have a Stress Test scheduled at [facility]. Your doctor has ordered this test to check the blood flow in your heart arteries.  Please arrive 15 minutes early for paperwork. The whole test will take several hours. You may want to bring reading material to remain occupied while undergoing different parts of the test.  Instructions: No food/drink after midnight the night before. It is OK to take your morning meds with a sip of water EXCEPT for those types of medicines listed below or otherwise instructed. No caffeine/decaf products 24 hours before, including medicines such as Excedrin or Goody Powders. Call if there are any questions.  Wear comfortable clothes and shoes.   Special Medication Instructions: Beta blockers such as metoprolol (Lopressor/Toprol XL), atenolol (Tenormin), carvedilol (Coreg), nebivolol (Bystolic), bisoprolol (Zebeta), propranolol (Inderal) should not be taken for 24 hours before the test. Calcium channel blockers such as diltiazem (Cardizem) or verapmil (Calan) should not be taken for 24 hours before the test. Remove nitroglycerin patches and do not take nitrate preparations such as Imdur/isosorbide the day of your test. No Persantine/Theophylline or Aggrenox medicines should be used within 24 hours of the  test.  If you are diabetic, please ask which medications to hold the day of the test.  What To Expect: When you arrive in the lab, the technician will inject a small amount of radioactive tracer into your arm through an IV while you are resting quietly. This helps [place] to form pictures of your heart. You will likely only feel a sting from the IV. After a waiting period, resting pictures will be obtained under a big camera. These are the "  before" pictures.  Next, you will be prepped for the stress portion of the test. This may include either walking on a treadmill or receiving a medicine that helps to dilate blood vessels in your heart to simulate the effect of exercise on your heart. If you are walking on a treadmill, you will walk at different paces to try to get your heart rate to a goal number that is based on your age. If your doctor has chosen the pharmacologic test, then you will receive a medicine through your IV that may cause temporary nausea, flushing, shortness of breath and sometimes chest discomfort or vomiting. This is typically short-lived and usually resolves quickly. If you experience symptoms, that does not automatically mean the test is abnormal. Some patients do not experience any symptoms at all. Your blood pressure and heart rate will be monitored, and we will be watching your EKG on a computer screen for any changes. During this portion of the test, the radiologist will inject another small amount of radioactive tracer into your IV. After a waiting period, you will undergo a second set of pictures. These are the "after" pictures.  The doctor reading the test will compare the before-and-after images to look for evidence of heart blockages or heart weakness. The test usually takes 1 day to complete, but in certain instances (for example, if a patient is over a certain weight limit), the test may be done over the span of 2 days.   Increase activity slowly   Complete by:  As directed         Discharge Medications   Allergies as of 11/25/2017      Reactions   Rosuvastatin Other (See Comments)   SEVERE muscle and leg cramps/aches   Sulfonamide Derivatives Rash, Other (See Comments)   Also "made the whites of my eyes turn yellow"      Medication List    STOP taking these medications   aspirin 325 MG tablet Replaced by:  aspirin 81 MG chewable tablet   benazepril-hydrochlorthiazide 10-12.5 MG tablet Commonly known as:  LOTENSIN HCT     TAKE these medications   albuterol 108 (90 Base) MCG/ACT inhaler Commonly known as:  PROVENTIL HFA;VENTOLIN HFA Inhale 2 puffs into the lungs every 6 (six) hours as needed for wheezing or shortness of breath.   allopurinol 300 MG tablet Commonly known as:  ZYLOPRIM Take 1 tablet (300 mg total) by mouth daily.   amLODipine 10 MG tablet Commonly known as:  NORVASC Take 1 tablet (10 mg total) by mouth daily. What changed:  See the new instructions.   aspirin 81 MG chewable tablet Chew 1 tablet (81 mg total) by mouth daily. Replaces:  aspirin 325 MG tablet   atorvastatin 40 MG tablet Commonly known as:  LIPITOR Take 1 tablet (40 mg total) by mouth daily at 6 PM. What changed:    medication strength  how much to take  when to take this   fish oil-omega-3 fatty acids 1000 MG capsule Take 1 g by mouth at bedtime.   furosemide 20 MG tablet Commonly known as:  LASIX Take 1 tablet (20 mg total) by mouth every other day. What changed:  when to take this   levothyroxine 50 MCG tablet Commonly known as:  SYNTHROID, LEVOTHROID TAKE 1 TABLET BY MOUTH ONCE DAILY IN THE MORNING What changed:  See the new instructions.   metoprolol succinate 25 MG 24 hr tablet Commonly known as:  TOPROL XL Take 1 tablet (25 mg  total) by mouth daily. What changed:    medication strength  how much to take  how to take this  when to take this  additional instructions   naproxen 500 MG tablet Commonly known as:  NAPROSYN Take 1  tablet (500 mg total) by mouth 2 (two) times daily with a meal. What changed:    when to take this  reasons to take this   vitamin C 500 MG tablet Commonly known as:  ASCORBIC ACID Take 500 mg by mouth daily.   VITAMIN D-3 PO Take 1 capsule by mouth daily.        Outstanding Labs/Studies   BMet at PCP follow up later this week.   Duration of Discharge Encounter   Greater than 30 minutes including physician time.  Signed, Berton Bon NP 11/25/2017, 8:55 AM  Patient seen and examined.  Plan as discussed in my rounding note for today and outlined above. Fayrene Fearing [facility]  11/25/2017  9:03 AM

## 2017-11-25 NOTE — Progress Notes (Signed)
Progress Note  Patient Name: Kevin DrownJohnny Flores Date of Encounter: 11/25/2017  Primary Cardiologist:   No primary care provider on file.   Subjective   No chest pain.  Breathing at baseline.  No palpitations.   Inpatient Medications    Scheduled Meds: . amLODipine  10 mg Oral Daily  . aspirin  81 mg Oral Daily  . atorvastatin  40 mg Oral q1800  . heparin  5,000 Units Subcutaneous Q8H  . levothyroxine  50 mcg Oral QAC breakfast  . sodium chloride flush  3 mL Intravenous Q12H   Continuous Infusions: . sodium chloride     PRN Meds: sodium chloride, acetaminophen, albuterol, atropine, ondansetron (ZOFRAN) IV, polyvinyl alcohol, sodium chloride flush   Vital Signs    Vitals:   11/24/17 1127 11/24/17 2058 11/25/17 0519 11/25/17 0616  BP: (!) 157/58 (!) 162/71  (!) 150/59  Pulse: (!) 38 75  72  Resp: 18 18  18   Temp: 98 F (36.7 C) 97.9 F (36.6 C)  98.1 F (36.7 C)  TempSrc: Oral Oral  Oral  SpO2: 97% 98%  92%  Weight:   198 lb 8 oz (90 kg)   Height:        Intake/Output Summary (Last 24 hours) at 11/25/2017 0744 Last data filed at 11/25/2017 0631 Gross per 24 hour  Intake 720 ml  Output 1300 ml  Net -580 ml   Filed Weights   11/23/17 1630 11/24/17 0515 11/25/17 0519  Weight: 204 lb 1.6 oz (92.6 kg) 201 lb 6.4 oz (91.4 kg) 198 lb 8 oz (90 kg)    Telemetry    NSR, ventricular bigeminy - Personally Reviewed  ECG    NA - Personally Reviewed  Physical Exam   GEN: No  acute distress.   Neck: No  JVD Cardiac: RRR, no murmurs, rubs, or gallops.  Respiratory: Clear   to auscultation bilaterally. GI: Soft, nontender, non-distended, normal bowel sounds  MS:   No edema; No deformity. Neuro:   Nonfocal  Psych: Oriented and appropriate    Labs    Chemistry Recent Labs  Lab 11/22/17 2226 11/23/17 0312 11/24/17 0632 11/25/17 0522  NA 139  --  139 138  K 3.9  --  4.0 4.1  CL 107  --  107 103  CO2 20*  --  22 25  GLUCOSE 100*  --  101* 108*  BUN 32*  --   32* 29*  CREATININE 2.33* 2.36* 2.11* 2.05*  CALCIUM 9.3  --  9.0 9.2  GFRNONAA 26* 26* 29* 30*  GFRAA 30* 30* 34* 35*  ANIONGAP 12  --  10 10     Hematology Recent Labs  Lab 11/23/17 0641 11/24/17 0632 11/25/17 0522  WBC 9.3 8.2 9.2  RBC 4.76 4.65 4.90  HGB 14.5 14.0 14.7  HCT 43.1 42.0 44.3  MCV 90.5 90.3 90.4  MCH 30.5 30.1 30.0  MCHC 33.6 33.3 33.2  RDW 14.8 14.6 14.6  PLT 126* 125* 134*    Cardiac Enzymes Recent Labs  Lab 11/23/17 0312 11/23/17 0641 11/23/17 1637  TROPONINI <0.03 <0.03 <0.03    Recent Labs  Lab 11/22/17 2239  TROPIPOC 0.01     BNP Recent Labs  Lab 11/22/17 2255  BNP 564.4*     DDimer No results for input(s): DDIMER in the last 168 hours.   Radiology    No results found.  Cardiac Studies   Echo 3/17  Study Conclusions  - Left ventricle: The cavity size was  normal. Wall thickness was   increased in a pattern of moderate LVH. Systolic function was   normal. The estimated ejection fraction was in the range of 50%   to 55%. Wall motion was normal; there were no regional wall   motion abnormalities. - Aortic valve: There was mild regurgitation. - Mitral valve: There was moderate regurgitation directed   eccentrically. - Left atrium: The atrium was moderately dilated. - Tricuspid valve: There was mild-moderate regurgitation. - Pulmonary arteries: Systolic pressure was moderately increased.   PA peak pressure: 51 mm Hg (S).   Patient Profile     77 y.o. male with a hx of CAD s/p CABG an subsequent PCI, HTN, HLD, arthritiswho is admitted for bradycardia and suspected heart failure.   Assessment & Plan    CKD:   Creat is stable.  Check BMET at primary care office later this week.   ACUTE ON CHRONIC:   Negative 3.2 liters.   Send home with Lasix 20 mg PO every other day.  1500 cc fluid restrict and low salt diet.    VENTRICULAR BIGEMINY:    No pauses.  No symptoms.  This was why the heart rate was recorded at 35.  OK to  go home and take half of his Toprol XL 50 mg qam.  CAD:  Enzymes negative. Need out patient Lexiscan Myoview.  He understands that he would only get a cath if this demonstrates a high risk finding.  His wife had cancer and is starting radiation and he would like to go home and have the stress test as an outpatient in our office.  HTN:  BP is up.  Restart low dose beta blocker.    I need him to keep a BP diary.  Follow up with me or APP after the [facility]. (10 days or so.)   For questions or updates, please contact [facility] Please consult [email or web address] for contact info under Cardiology/STEMI.   Signed, Rollene Rotunda, MD  11/25/2017, 7:44 AM

## 2017-11-25 NOTE — Consult Note (Signed)
   [facility] CM Inpatient Consult   11/25/2017  Gid Schoffstall [birth year] [phone]  Referral received for HF.  Patient screened for potential [place] [facility] services. Patient is in the [place] of the [place] [facility] services under patient's Medicare plan.  Patient admitted for HF.  Met with the patient at the bedside. Inpatient RNCM assigned patient for HF EMMI.  Explained phone calls and patient accepted verbally.  Gave the patient a brochure and 24 hour nurse advise line for follow up.     For questions contact:   Natividad Brood, RN BSN [facility] Liaison  [phone] business mobile phone Toll free office [phone]

## 2017-11-25 NOTE — Care Management Note (Signed)
Case Management Note  Patient Details  Name: Verdie DrownJohnny Mcgurk MRN: [id number] Date of Birth: 03/27/1941  Subjective/Objective:     CHF             Action/Plan: Primary Care Provider: Elenora GammaBradshaw, Samuel L; has private insurance with Medicare; CM following for progression of care;  Expected Discharge Date:  11/25/17               Expected Discharge Plan:  Home/Self Care  In-House Referral:   [facility]  Discharge planning Services  CM Consult  Status of Service:  In process, will continue to follow  Reola MosherChandler, Branko Steeves L, RN,MHA,BSN [phone] 11/25/2017, 10:02 AM

## 2017-11-26 ENCOUNTER — Telehealth: Payer: Self-pay | Admitting: *Deleted

## 2017-11-26 DIAGNOSIS — N289 Disorder of kidney and ureter, unspecified: Secondary | ICD-10-CM

## 2017-11-26 NOTE — Telephone Encounter (Signed)
Call Completed and Appointment Scheduled: Yes, Date: 12/03/17 with Dr Ermalinda MemosBradshaw. Labs ordered for 11/29/17. Patient will come by for those before having stress test.    DISCHARGE INFORMATION Date of Discharge:11/25/17  Discharge Facility: [facility]  Principal Discharge Diagnosis: Symptomatic bradycardia and CHF  Patient and/or caregiver is knowledgeable of his/her condition(s) and treatment: Yes  MEDICATION RECONCILIATION Current medication list reviewed with patient:Yes  Outpatient Encounter Medications as of 11/26/2017  Medication Sig  . albuterol (PROVENTIL HFA;VENTOLIN HFA) 108 (90 BASE) MCG/ACT inhaler Inhale 2 puffs into the lungs every 6 (six) hours as needed for wheezing or shortness of breath.  . allopurinol (ZYLOPRIM) 300 MG tablet Take 1 tablet (300 mg total) by mouth daily.  Marland Kitchen. amLODipine (NORVASC) 10 MG tablet Take 1 tablet (10 mg total) by mouth daily.  Marland Kitchen. aspirin 81 MG chewable tablet Chew 1 tablet (81 mg total) by mouth daily.  Marland Kitchen. atorvastatin (LIPITOR) 40 MG tablet Take 1 tablet (40 mg total) by mouth daily at 6 PM.  . Cholecalciferol (VITAMIN D-3 PO) Take 1 capsule by mouth daily.  . fish oil-omega-3 fatty acids 1000 MG capsule Take 1 g by mouth at bedtime.   . furosemide (LASIX) 20 MG tablet Take 1 tablet (20 mg total) by mouth every other day.  . levothyroxine (SYNTHROID, LEVOTHROID) 50 MCG tablet TAKE 1 TABLET BY MOUTH ONCE DAILY IN THE MORNING (Patient taking differently: Take 50 mcg by mouth once a day in the morning)  . metoprolol succinate (TOPROL XL) 25 MG 24 hr tablet Take 1 tablet (25 mg total) by mouth daily.  . naproxen (NAPROSYN) 500 MG tablet Take 1 tablet (500 mg total) by mouth 2 (two) times daily with a meal. (Patient taking differently: Take 500 mg by mouth 2 (two) times daily as needed (for gout flares/pain). )  . vitamin C (ASCORBIC ACID) 500 MG tablet Take 500 mg by mouth daily.   No facility-administered encounter medications on file as of 11/26/2017.      Discharge Medications reviewed and reconciled with current medications.yes  Patient is able to obtain needed medications:Yes  ACTIVITIES OF DAILY LIVING  Is the patient able to perform his/her own ADLs: Yes.    Patient is receiving home health services: No.  PATIENT EDUCATION Questions/Concerns Discussed: Having stress test on 11/29/17. Dr [name] requested that he come over that morning for renal labs. Appt scheduled and BMP ordered.

## 2017-11-27 ENCOUNTER — Telehealth (HOSPITAL_COMMUNITY): Payer: Self-pay

## 2017-11-27 NOTE — Telephone Encounter (Signed)
Encounter complete. 

## 2017-11-28 ENCOUNTER — Telehealth (HOSPITAL_COMMUNITY): Payer: Self-pay

## 2017-11-28 ENCOUNTER — Other Ambulatory Visit: Payer: Self-pay

## 2017-11-28 ENCOUNTER — Other Ambulatory Visit (HOSPITAL_COMMUNITY): Payer: Medicare Other

## 2017-11-28 NOTE — Patient Outreach (Signed)
[facility] Care Management  11/28/2017  Kevin DrownJohnny Flores 01/28/1941 [id number]     EMMI-HF RED ON EMMI ALERT Day # 2 Date: 11/27/17 Red Alert Reason: "Weighed themselves today? No   New/worsening problems? Yes"     Outreach attempt # 1 to patient. No answer at present. RN CM left HIPAA compliant voicemail message along with contact info.          Plan: RN CM will make outreach attempt to patient within one business day if no return call from patient.   Antionette Fairyoshanda Whitni Pasquini, RN,BSN,CCM [facility] Care Management Telephonic Care Management Coordinator Direct Phone: [phone] Toll Free: [phone] Fax: [phone]

## 2017-11-28 NOTE — Telephone Encounter (Signed)
Encounter complete. 

## 2017-11-28 NOTE — Patient Outreach (Signed)
[facility]) Care Management  11/28/2017  Kevin DrownJohnny Flores 11/09/1940 [id number]      EMMI-HF RED ON EMMI ALERT Day # 2 Date: 11/27/17 Red Alert Reason: "Weighed themselves today? No   New/worsening problems? Yes"    Voicemail message received from patient requesting that RN CM call him back on his cell phone at [phone]. Call placed to patient. Spoke with patient who voices he is doing well and has already returned back to work. 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 Reviewed and addressed red alert with patient. Patient reports that his scale is broken and he is in need of another one. He reports some very mild edema from being active and on his feet a lot. He voices that he goes to have lab work done on tomorrow and will see PCP on 12/03/17. He also voices that he is scheduled to have a stress test done as well. Patient is driving himself and denies any issues with transportation. He voices that he is going to go pick up his meds today. He states that he had just refilled his regular meds prior to hospitalization and only has a few meds that he needs to pick up. He denies any other needs or concerns. RN CM will send scale via mail to patient to monitor weight and provided education on the importance of monitoring weight. Advised patient that they would continue to get automated EMMI- HF post discharge calls to assess how they are doing following recent hospitalization and will receive a call from a nurse if any of their responses were abnormal. Patient voiced understanding and was appreciative of f/u call.    Plan: RN CM will request scale be shipped to patient. RN CM will follow up with patient to ensure scale delivered to the home.   Antionette Fairyoshanda Leilene Diprima, RN,BSN,CCM [facility] Care Management Telephonic Care Management Coordinator Direct Phone: [phone] Toll Free: [phone] Fax: [phone]

## 2017-11-29 ENCOUNTER — Ambulatory Visit (HOSPITAL_COMMUNITY)
Admit: 2017-11-29 | Discharge: 2017-11-29 | Disposition: A | Discharge status: Home / Self Care | Payer: Medicare Other | Attending: Cardiovascular Disease | Admitting: Cardiovascular Disease

## 2017-11-29 ENCOUNTER — Other Ambulatory Visit: Payer: Medicare Other

## 2017-11-29 DIAGNOSIS — I2510 Atherosclerotic heart disease of native coronary artery without angina pectoris: Secondary | ICD-10-CM | POA: Insufficient documentation

## 2017-11-29 DIAGNOSIS — Z87891 Personal history of nicotine dependence: Secondary | ICD-10-CM | POA: Insufficient documentation

## 2017-11-29 DIAGNOSIS — I110 Hypertensive heart disease with heart failure: Secondary | ICD-10-CM | POA: Insufficient documentation

## 2017-11-29 DIAGNOSIS — I5033 Acute on chronic diastolic (congestive) heart failure: Secondary | ICD-10-CM | POA: Insufficient documentation

## 2017-11-29 DIAGNOSIS — N289 Disorder of kidney and ureter, unspecified: Secondary | ICD-10-CM | POA: Diagnosis not present

## 2017-11-29 DIAGNOSIS — E039 Hypothyroidism, unspecified: Secondary | ICD-10-CM | POA: Insufficient documentation

## 2017-11-29 DIAGNOSIS — E785 Hyperlipidemia, unspecified: Secondary | ICD-10-CM | POA: Insufficient documentation

## 2017-11-29 DIAGNOSIS — Z951 Presence of aortocoronary bypass graft: Secondary | ICD-10-CM | POA: Diagnosis not present

## 2017-11-29 MED ORDER — REGADENOSON 0.4 MG/5ML IV SOLN
0.4000 mg | Freq: Once | INTRAVENOUS | Status: AC
  Administered 2017-11-29: 0.4 mg via INTRAVENOUS

## 2017-11-29 MED ORDER — TECHNETIUM TC 99M TETROFOSMIN IV KIT
31.8000 | PACK | Freq: Once | INTRAVENOUS | Status: AC | PRN
Start: 2017-11-29 — End: 2017-11-29
  Administered 2017-11-29: 31.8 via INTRAVENOUS
  Filled 2017-11-29: qty 32

## 2017-11-29 MED ORDER — TECHNETIUM TC 99M TETROFOSMIN IV KIT
10.0000 | PACK | Freq: Once | INTRAVENOUS | Status: AC | PRN
  Administered 2017-11-29: 10 via INTRAVENOUS
  Filled 2017-11-29: qty 10

## 2017-11-30 LAB — BMP8+EGFR
BUN/Creatinine Ratio: 11 (ref 10–24)
BUN: 19 mg/dL (ref 8–27)
CALCIUM: 9.9 mg/dL (ref 8.6–10.2)
CHLORIDE: 108 mmol/L — AB (ref 96–106)
CO2: 22 mmol/L (ref 20–29)
Creatinine, Ser: 1.68 mg/dL — ABNORMAL HIGH (ref 0.76–1.27)
GFR calc Af Amer: 46 mL/min/{1.73_m2} — ABNORMAL LOW (ref >59)
GFR calc non Af Amer: 39 mL/min/{1.73_m2} — ABNORMAL LOW (ref >59)
GLUCOSE: 101 mg/dL — AB (ref 65–99)
POTASSIUM: 4.6 mmol/L (ref 3.5–5.2)
Sodium: 146 mmol/L — ABNORMAL HIGH (ref 134–144)

## 2017-12-02 ENCOUNTER — Other Ambulatory Visit: Payer: Self-pay

## 2017-12-02 ENCOUNTER — Ambulatory Visit: Payer: Medicare Other | Admitting: Family Medicine

## 2017-12-02 LAB — MYOCARDIAL PERFUSION IMAGING
CHL CUP NUCLEAR SSS: 8
CSEPPHR: 93 {beats}/min
LVDIAVOL: 197 mL (ref 62–150)
LVSYSVOL: 108 mL
Rest HR: 70 {beats}/min
SDS: 6
SRS: 2
TID: 1.13

## 2017-12-02 NOTE — Patient Outreach (Signed)
[facility]) Care Management  12/02/2017  Kevin DrownJohnny Delbuono 06/24/1941 [id number]     EMMI-CHF RED ON EMMI ALERT Day # 4 Date: 11/29/17 Red Alert Reason: "Weighed themselves today? No"     Outreach attempt # 1 to patient Spoke briefly as patient currently at work. He voices he has been doing well except for some mild and occassional heartburn that he attributes to acid reflux. He has PCP appt on tomorrow and will discuss OTC med options with MD. Patient has been unable to weigh as no working scale in the home. Advised patient that per previous conversation with him that RN CM has placed for for scale for to be mailed to him. Advised him that scale is expected to be delivered to his home on 12/06/17. RN CM will call back to follow up to see if patient received scale. Otherwise, patient has no further needs or concerns at this time.         Plan: RN CM will follow with patient within a week to see if scale delivered to home.   Antionette Fairyoshanda Ronnita Paz, RN,BSN,CCM [facility] Care Management Telephonic Care Management Coordinator Direct Phone: [phone] Toll Free: [phone] Fax: [phone]

## 2017-12-03 ENCOUNTER — Encounter: Payer: Self-pay | Admitting: Family Medicine

## 2017-12-03 ENCOUNTER — Ambulatory Visit (INDEPENDENT_AMBULATORY_CARE_PROVIDER_SITE_OTHER): Payer: Medicare Other | Admitting: Family Medicine

## 2017-12-03 VITALS — BP 138/60 | HR 72 | Temp 97.0°F | Ht 69.0 in | Wt 204.2 lb

## 2017-12-03 DIAGNOSIS — I509 Heart failure, unspecified: Secondary | ICD-10-CM

## 2017-12-03 MED ORDER — PANTOPRAZOLE SODIUM 40 MG PO TBEC: 40.0000 mg | DELAYED_RELEASE_TABLET | Freq: Every day | ORAL | 3 refills | Status: DC

## 2017-12-03 MED ORDER — FLUTICASONE PROPIONATE 50 MCG/ACT NA SUSP: 2.0000 | Freq: Every day | NASAL | 6 refills | Status: DC

## 2017-12-03 NOTE — Patient Instructions (Signed)
Great to see you!   

## 2017-12-03 NOTE — Progress Notes (Signed)
   HPI  Patient presents today here for hospital follow-up, transitional care visit.  Patient's medications were reviewed carefully and he has no questions.  He reports a very good urine output with Lasix 20 mg once every other day.  He states that he has had increasing and worsening heartburn over the last month or so, this does respond minimally to over-the-counter medication.  He is not sure of the active ingredient.  Patient also complains of persistent cough, he is breathing much better and states that he feels much better after diuresis in the hospital but does still persistent cough.  He is concerned about this given that his wife is currently taking chemotherapy.  PMH: Smoking status noted ROS: Per HPI  Objective: BP 138/60   Pulse 72   Temp (!) 97 F (36.1 C) (Oral)   Ht '5\' 9"'$  (1.753 m)   Wt 204 lb 3.2 oz (92.6 kg)   BMI 30.16 kg/m  Gen: NAD, alert, cooperative with exam HEENT: NCAT, boggy nasal mucosa, cobblestoning of the oropharynx CV: RRR, good S1/S2, no murmur Resp: CTABL, no wheezes, non-labored Ext: Trace to 1+ pitting edema Neuro: Alert and oriented, No gross deficits  Assessment and plan:  #CHF Patient with normal EF, however clinically has CHF, he is doing well with diuresis and very well after aggressive diuresis in the hospital. He has good urine output with current dose of Lasix, he is doing daily weights with check in with cardiology BMP was rechecked last week  # GERD Recently worsening, Starting PPI May be contributing to cough, uncontrolled X 1 month  # cough Likely upper airwaty cough, I had hoped helping volume would change this ( it has improved breathing much) Flonase with cobblestoning as evidence for post nasal drip, also PPI    Orders Placed This Encounter  Procedures  . BMP8+EGFR    Meds ordered this encounter  Medications  . pantoprazole (PROTONIX) 40 MG tablet    Sig: Take 1 tablet (40 mg total) by mouth daily.    Dispense:   30 tablet    Refill:  3  . fluticasone (FLONASE) 50 MCG/ACT nasal spray    Sig: Place 2 sprays into both nostrils daily.    Dispense:  16 g    Refill:  [place], MD [facility] 12/03/2017, 2:57 PM

## 2017-12-05 ENCOUNTER — Ambulatory Visit: Payer: Self-pay

## 2017-12-09 ENCOUNTER — Other Ambulatory Visit: Payer: Self-pay

## 2017-12-09 NOTE — Patient Outreach (Signed)
[facility]) Care Management  12/09/2017  Kevin DrownJohnny Flores 01/08/1941 [id number]   EMMI-HF Follow Up Call    Outreach attempt #1 to patient. Spoke with spouse. She confirmed that patient did receive scale. He has been using the scale. Patient has already left the house to go to work. She voices that patient is overall doing well. He goes for MD follow up appt on Thursday and will discuss one concern with MD during that time. Otherwise, no further RN CM needs or concerns at this time.      Plan: RN CM will close case at this time as no further interventions needed.    Antionette Fairyoshanda Izmael Duross, RN,BSN,CCM [facility] Care Management Telephonic Care Management Coordinator Direct Phone: [phone] Toll Free: [phone] Fax: [phone]

## 2017-12-12 ENCOUNTER — Encounter: Payer: Self-pay | Admitting: Physician Assistant

## 2017-12-12 ENCOUNTER — Ambulatory Visit (INDEPENDENT_AMBULATORY_CARE_PROVIDER_SITE_OTHER): Payer: Medicare Other | Admitting: Physician Assistant

## 2017-12-12 VITALS — BP 142/82 | HR 69 | Ht 69.0 in | Wt 204.8 lb

## 2017-12-12 DIAGNOSIS — I2510 Atherosclerotic heart disease of native coronary artery without angina pectoris: Secondary | ICD-10-CM | POA: Diagnosis not present

## 2017-12-12 DIAGNOSIS — I5032 Chronic diastolic (congestive) heart failure: Secondary | ICD-10-CM | POA: Diagnosis not present

## 2017-12-12 DIAGNOSIS — I10 Essential (primary) hypertension: Secondary | ICD-10-CM

## 2017-12-12 MED ORDER — ISOSORBIDE MONONITRATE ER 30 MG PO TB24: 30.0000 mg | ORAL_TABLET | Freq: Every day | ORAL | 3 refills | Status: DC

## 2017-12-12 NOTE — Patient Instructions (Addendum)
MEDICATION INSTRUCTION   START ISOSORBIDE MONO ( IMDUR) 30 MG  DAILY - PLEASE TAKE DAILY ASPIRIN 81 MG  30 MIN PRIOR THE ABOVE MEDICATION .    CONTINUE  Low salt ( sodium ) diet   Keep a record of daily weight .    [facility] contact Dr [facility] FOR AN APPOINTMENT [address] [place] [id number].    Your physician recommends that you schedule a follow-up appointment in 2 MONTHS WITH DR [facility] .  If you need a refill on your cardiac medications before your next appointment, please call your pharmacy.

## 2017-12-12 NOTE — Progress Notes (Signed)
Cardiology Office Note   Date:  12/12/2017   ID:  Kevin Flores, DOB 11-Feb-1941, MRN [id number]  PCP:  Kevin Gamma, MD  Cardiologist: Dr. Antoine Poche, 11/25/2017 in hospital Kevin Demark, PA-C   Chief Complaint  Patient presents with  . Hospitalization Follow-up    History of Present Illness: Kevin Flores is a 77 y.o. male with a history of CAD s/p CABG 1998, DES RCA 2001, DES PDA 2011 (LIMA-LAD & SVG-OM ok), HTN, HLD, arthritis, thyroid dz  Admitted 3/15-3/18/2019 for bradycardia and CHF exacerbation, weight at discharge 198 pounds, outpatient Myoview planned, creatinine 2.05 at discharge, 1.68 at recheck 3/22 3/22 Myoview was intermediate risk but Dr. Antoine Poche feels he would be at high risk for renal failure with cath continue medical management, depending on symptoms  Kevin Flores presents for cardiology follow up.  Before he went in the hospital, he would get bendopnea and then get light-headed when he stood up. This has improved.  Since he left the hospital, he has had severe heartburn. It is worst in the evenings when he goes to bed. He was put on Protonix last week by his PCP. This is different from the sx he had prior to his CABG or PCIs. He was told at one point that there was a problem with his esophagus, but has not seen a GI doctor in several years.  He mowed the grass yesterday, was able to bend over without feeling SOB, not light headed when he stood up. He was a little SOB with the mowing but had no chest pain.    No LE edema, no orthopnea, no PND. He still gets a little SOB bending over and exerting himself, but not bad. He does not feel limited in his activities by his breathing or by CP.  He has been tracking his weight at home and it has been stable.  He feels he is compliant with a low-sodium diet.  He is compliant with his medications.  He has blood pressure records with him today.  His blood pressure has been running up at times, he feels that this may be  due to the emotional stress of his wife's cancer.   Past Medical History:  Diagnosis Date  . Allergy    seasonal  . Arthritis   . CAD (coronary artery disease)    1998 LIMA to the LAD, SVG to circumflex. DES placed to the PDA 11/2009   . Gout   . Heartburn   . Hyperlipidemia   . Hypertension   . Nephrolithiasis    left  . Personal history of colonic polyps    adenomas  . Thyroid disease     Past Surgical History:  Procedure Laterality Date  . BIOPSY PROSTATE    . cardiac stents  2001   and 19147  . COLONOSCOPY W/ POLYPECTOMY  2009   3 small adenomas  . CORONARY ARTERY BYPASS GRAFT  1998   2 bypass  . cysto with calculus manipulation    . UPPER GASTROINTESTINAL ENDOSCOPY      Current Outpatient Medications  Medication Sig Dispense Refill  . albuterol (PROVENTIL HFA;VENTOLIN HFA) 108 (90 BASE) MCG/ACT inhaler Inhale 2 puffs into the lungs every 6 (six) hours as needed for wheezing or shortness of breath. 1 Inhaler 3  . allopurinol (ZYLOPRIM) 300 MG tablet Take 1 tablet (300 mg total) by mouth daily. 90 tablet 3  . amLODipine (NORVASC) 10 MG tablet Take 1 tablet (10 mg total) by mouth daily. 90  tablet 3  . aspirin 81 MG chewable tablet Chew 1 tablet (81 mg total) by mouth daily. 90 tablet 3  . atorvastatin (LIPITOR) 40 MG tablet Take 1 tablet (40 mg total) by mouth daily at 6 PM. 90 tablet 3  . Cholecalciferol (VITAMIN D-3 PO) Take 1 capsule by mouth daily.    . fish oil-omega-3 fatty acids 1000 MG capsule Take 1 g by mouth at bedtime.     . fluticasone (FLONASE) 50 MCG/ACT nasal spray Place 2 sprays into both nostrils daily. 16 g 6  . furosemide (LASIX) 20 MG tablet Take 1 tablet (20 mg total) by mouth every other day. 90 tablet 2  . levothyroxine (SYNTHROID, LEVOTHROID) 50 MCG tablet TAKE 1 TABLET BY MOUTH ONCE DAILY IN THE MORNING (Patient taking differently: Take 50 mcg by mouth once a day in the morning) 90 tablet 0  . metoprolol succinate (TOPROL XL) 25 MG 24 hr tablet  Take 1 tablet (25 mg total) by mouth daily. 30 tablet 11  . naproxen (NAPROSYN) 500 MG tablet Take 1 tablet (500 mg total) by mouth 2 (two) times daily with a meal. (Patient taking differently: Take 500 mg by mouth 2 (two) times daily as needed (for gout flares/pain). ) 60 tablet 0  . pantoprazole (PROTONIX) 40 MG tablet Take 1 tablet (40 mg total) by mouth daily. 30 tablet 3  . vitamin C (ASCORBIC ACID) 500 MG tablet Take 500 mg by mouth daily.     No current facility-administered medications for this visit.     Allergies:   Rosuvastatin and Sulfonamide derivatives    Social History:  The patient  reports that he quit smoking about 46 years ago. He has never used smokeless tobacco. He reports that he does not drink alcohol or use drugs.   Family History:  The patient's family history includes Colon cancer in his brother.    ROS:  Please see the history of present illness. All other systems are reviewed and negative.    PHYSICAL EXAM: VS:  BP (!) 142/82 (BP Location: Left Arm, Patient Position: Sitting, Cuff Size: Normal)   Pulse 69   Ht 5\' 9"  (1.753 m)   Wt 204 lb 12.8 oz (92.9 kg)   SpO2 97%   BMI 30.24 kg/m  , BMI Body mass index is 30.24 kg/m. GEN: Well nourished, well developed, male in no acute distress  HEENT: normal for age  Neck: Minimal JVD, no carotid bruit, no masses Cardiac: Slightly irregular rate and rhythm; 2/6 murmur, no rubs, or gallops Respiratory:  clear to auscultation bilaterally, normal work of breathing GI: soft, nontender, nondistended, + BS MS: no deformity or atrophy; no edema; distal pulses are 2+ in all 4 extremities   Skin: warm and dry, no rash Neuro:  Strength and sensation are intact Psych: euthymic mood, full affect   EKG:  EKG is not ordered today.  MYOVIEW: 11/29/2017  The left ventricular ejection fraction is mildly decreased (45-54%).  Nuclear stress EF: 45%.  There was no ST segment deviation noted during stress.  No T wave  inversion was noted during stress.  Defect 1: There is a large defect of moderate severity.  Findings consistent with ischemia.  This is an intermediate risk study.  Large size, moderate intensity reversible (SDS 6) perfusion defect of the anterolateral and inferolateral walls suggestive of ischemia, possibly in the territory of the SVG to LCx bypass graft. LVEF 45% with lateral hypokinesis. This is an intermediate risk study.  Echocardiogram  11/24/17 Study Conclusions - Left ventricle: The cavity size was normal. Wall thickness was increased in a pattern of moderate LVH. Systolic function was normal. The estimated ejection fraction was in the range of 50%to 55%. Wall motion was normal; there were no regional wallmotion abnormalities. - Aortic valve: There was mild regurgitation. - Mitral valve: There was moderate regurgitation directed eccentrically. - Left atrium: The atrium was moderately dilated. - Tricuspid valve: There was mild-moderate regurgitation. - Pulmonary arteries: Systolic pressure was moderately increased. PA peak pressure: 51 mm Hg (S).  Recent Labs: 08/15/2017: ALT 12 11/22/2017: B Natriuretic Peptide 564.4 11/25/2017: Hemoglobin 14.7; Magnesium 2.0; Platelets 134 11/29/2017: BUN 19; Creatinine, Ser 1.68; Potassium 4.6; Sodium 146    Lipid Panel    Component Value Date/Time   CHOL 123 05/21/2016 0915   TRIG 105 05/21/2016 0915   HDL 31 (L) 05/21/2016 0915   CHOLHDL 4.0 05/21/2016 0915   CHOLHDL 4.7 11/18/2009 0347   VLDL 27 11/18/2009 0347   LDLCALC 71 05/21/2016 0915   LDLDIRECT 77 09/12/2017 1345     Wt Readings from Last 3 Encounters:  12/12/17 204 lb 12.8 oz (92.9 kg)  12/03/17 204 lb 3.2 oz (92.6 kg)  11/29/17 198 lb (89.8 kg)     Other studies Reviewed: Additional studies/ records that were reviewed today include: Office notes, hospital records and testing.  ASSESSMENT AND PLAN:  1.  Chronic diastolic CHF: His weight is stable on his  home scales.  He is compliant with a low-sodium diet.  He is managing his volume well.  Continue Lasix every other day.  2.  CAD: He is on good therapy with aspirin, statin, low-dose beta-blocker.  As part of his blood pressure management, he is on amlodipine 10 mg which may also have some antianginal effects.  I will add Imdur 30 mg daily.  No increase in beta-blocker with a history of bradycardia.  3.  Hypertension: His blood pressure records are reviewed.  His systolic blood pressure has been between 160 and 111.  Most of the time, he is in the 130s-140s.  He admits that his blood pressure may be running higher than usual now because his wife has cancer.  4.  GI issues: Patient has significant reflux symptoms and also describes some dysphagia.  He was put on Protonix by his PCP.  He is also strongly encouraged to follow-up with GI.   Current medicines are reviewed at length with the patient today.  The patient does not have concerns regarding medicines.  The following changes have been made: Add Imdur  Labs/ tests ordered today include:  No orders of the defined types were placed in this encounter.    Disposition:   FU with Dr. [name]  Signed, Kevin Demarkhonda Ruhee Enck, PA-C  12/12/2017 4:02 PM    [facility] Phone: [phone]; Fax: [phone]  This note was written with the assistance of speech recognition software. Please excuse any transcriptional errors.

## 2017-12-13 ENCOUNTER — Encounter: Payer: Self-pay | Admitting: Internal Medicine

## 2017-12-16 ENCOUNTER — Telehealth: Payer: Self-pay | Admitting: Physician Assistant

## 2017-12-16 ENCOUNTER — Other Ambulatory Visit: Payer: Self-pay

## 2017-12-16 NOTE — Telephone Encounter (Signed)
Spoke with patients spouse, Jerrye BeaversHazel, per [facility] and informed her to have Mr Kevin Flores stop Imdur per Theodore Demarkhonda Barrett. She voiced understanding and stated the patient did not like this medication and did not want to take it. Advised her to have patient contact the office if symptoms do not get better. She voiced understanding. Medication discontinued.

## 2017-12-16 NOTE — Telephone Encounter (Signed)
Pt c/o medication issue: 1. Name of Medication:Isosorbide Mono2. How are you currently taking this medication (dosage and times per day)? 1 a day [facility] [employer] 3. Are you having a reaction (difficulty breathing--STAT)?A little difficulty in breathing,head hurt,ache all over,could not hardly walk 4. What is your medication issue? He says he can not take the Isosorbide,it make him feel bad all over

## 2017-12-16 NOTE — Telephone Encounter (Signed)
Returned call to patient who saw Bjorn LoserRhonda PA on 12/12/17 and was started on Imdur. He reports he took 3 doses of the medication and states he thinks it made him feel worse. He c/o difficulty breathing, headaches, aches. He would like advice on what to do going forward.   Routed to PA

## 2017-12-16 NOTE — Patient Outreach (Signed)
[facility]) Care Management  12/16/2017  Verdie DrownJohnny Fukuda 11/22/1940 [id number]     EMMI-HF RED ON EMMI ALERT Day # 19 Date: 12/14/17 Red Alert Reason: "New/worsening problems? Yes"   Outreach attempt # 1 to patient. No answer at present. RN CM left HIPAA compliant voicemail message along with contact info.         Plan: RN CM will make outreach attempt to patient within 3-4 business days.    Antionette Fairyoshanda Kilah Drahos, RN,BSN,CCM [facility] Care Management Telephonic Care Management Coordinator Direct Phone: [phone] Toll Free: [phone] Fax: [phone]

## 2017-12-17 ENCOUNTER — Other Ambulatory Visit: Payer: Self-pay

## 2017-12-17 NOTE — Patient Outreach (Addendum)
[facility]) Care Management  12/17/2017  Kevin DrownJohnny Flores 06/20/1941 [id number]     EMMI-HF RED ON EMMI ALERT Day # 19 Date: 12/14/17 Red Alert Reason: "New/worsening problems? Yes"   Outreach attempt #2 to patient. Spoke with spouse as patient currently at work. NO PHI given-spouse freely reported. She reports that she is helping answer automated calls. She states that patient was started on new med and was warned that it may cause headaches. She reports that patient started feeling "achy allover." he notified MD office and was advised by nurse to stop taking med. Since then he has been feeling better. No further issues or concerns at this time.    Plan: RN CM will close case at this time as no further interventions needed.     Kevin Fairyoshanda [name], RN,BSN,CCM [facility] Care Management Telephonic Care Management Coordinator Direct Phone: [phone] Toll Free: [phone] Fax: [phone]

## 2017-12-18 ENCOUNTER — Other Ambulatory Visit: Payer: Self-pay

## 2017-12-18 ENCOUNTER — Ambulatory Visit (INDEPENDENT_AMBULATORY_CARE_PROVIDER_SITE_OTHER): Payer: Medicare Other | Admitting: Family Medicine

## 2017-12-18 ENCOUNTER — Encounter: Payer: Self-pay | Admitting: Family Medicine

## 2017-12-18 VITALS — BP 150/83 | HR 88 | Temp 97.9°F | Ht 69.0 in | Wt 205.0 lb

## 2017-12-18 DIAGNOSIS — H6123 Impacted cerumen, bilateral: Secondary | ICD-10-CM | POA: Diagnosis not present

## 2017-12-18 DIAGNOSIS — R05 Cough: Secondary | ICD-10-CM | POA: Diagnosis not present

## 2017-12-18 DIAGNOSIS — R053 Chronic cough: Secondary | ICD-10-CM

## 2017-12-18 DIAGNOSIS — Z9889 Other specified postprocedural states: Secondary | ICD-10-CM

## 2017-12-18 DIAGNOSIS — R1110 Vomiting, unspecified: Secondary | ICD-10-CM

## 2017-12-18 DIAGNOSIS — R6881 Early satiety: Secondary | ICD-10-CM

## 2017-12-18 MED ORDER — BENZONATATE 100 MG PO CAPS: 100.0000 mg | ORAL_CAPSULE | Freq: Three times a day (TID) | ORAL | 0 refills | Status: DC | PRN

## 2017-12-18 NOTE — Patient Outreach (Signed)
[facility]) Care Management  12/18/2017  Kevin DrownJohnny Flores 01/03/1941 [id number]     EMMI-HF RED ON EMMI ALERT Day # 22 Date: 12/17/17 Red Alert Reason: "Questions about meds? Yes"     Red on EMMI dashboard received. No outreach call warranted to patient at this time. RN CM addressed issue on previous call. Patient has already contacted MD office and advised to stop medicine. No further interventions needed at this time.      Plan: RN CM will close case at this time.    [name], RN,BSN,CCM [facility] Care Management Telephonic Care Management Coordinator Direct Phone: [phone] Toll Free: [phone] Fax: [phone]

## 2017-12-18 NOTE — Progress Notes (Signed)
Subjective: CC: Cough PCP: [name], MD NFA:OZHYQM Kevin Flores is a 77 y.o. male presenting to clinic today for:  1. Cough/ Early satiety Patient was seen on 3/14 for cough.  He was noted to be in fluid overload and cough was thought to be secondary to hypervolemia.  He was seen on 3/15 in the emergency department for symptomatic bradycardia and had a chest x-ray at that time which did not reveal any acute pulmonary processes.  He followed up with his primary care doctor on 3/26 with persistent cough, despite improvement in volume status.  This is thought to be possibly related to allergies versus uncontrolled acid reflux.  He was discharged home with Flonase and a PPI.  Today he presents to office and notes persistent dry cough with what feels like a wheeze at nighttime.  Denies hemoptysis, shortness of breath, daytime wheeze, chest pain or heart palpitations.  He does report that he has been having early satiety and frequent regurgitation of food despite use of Protonix.  Denies any nausea, vomiting, unplanned weight loss, night sweats.  He notes a history of esophageal stretching.  He called his gastroenterologist to see if he can get an appointment but was unable to get anything until June.  He sees Dr. Leone Payor in [place].  ROS: Per HPI  Allergies  Allergen Reactions  . Rosuvastatin Other (See Comments)    SEVERE muscle and leg cramps/aches  . Sulfonamide Derivatives Rash and Other (See Comments)    Also "made the whites of my eyes turn yellow"   Past Medical History:  Diagnosis Date  . Allergy    seasonal  . Arthritis   . CAD (coronary artery disease)    1998 LIMA to the LAD, SVG to circumflex. DES placed to the PDA 11/2009   . Gout   . Heartburn   . Hyperlipidemia   . Hypertension   . Nephrolithiasis    left  . Personal history of colonic polyps    adenomas  . Thyroid disease     Current Outpatient Medications:  .  albuterol (PROVENTIL HFA;VENTOLIN HFA) 108 (90  BASE) MCG/ACT inhaler, Inhale 2 puffs into the lungs every 6 (six) hours as needed for wheezing or shortness of breath., Disp: 1 Inhaler, Rfl: 3 .  allopurinol (ZYLOPRIM) 300 MG tablet, Take 1 tablet (300 mg total) by mouth daily., Disp: 90 tablet, Rfl: 3 .  amLODipine (NORVASC) 10 MG tablet, Take 1 tablet (10 mg total) by mouth daily., Disp: 90 tablet, Rfl: 3 .  aspirin 81 MG chewable tablet, Chew 1 tablet (81 mg total) by mouth daily., Disp: 90 tablet, Rfl: 3 .  atorvastatin (LIPITOR) 40 MG tablet, Take 1 tablet (40 mg total) by mouth daily at 6 PM., Disp: 90 tablet, Rfl: 3 .  Cholecalciferol (VITAMIN D-3 PO), Take 1 capsule by mouth daily., Disp: , Rfl:  .  fish oil-omega-3 fatty acids 1000 MG capsule, Take 1 g by mouth at bedtime. , Disp: , Rfl:  .  fluticasone (FLONASE) 50 MCG/ACT nasal spray, Place 2 sprays into both nostrils daily., Disp: 16 g, Rfl: 6 .  furosemide (LASIX) 20 MG tablet, Take 1 tablet (20 mg total) by mouth every other day., Disp: 90 tablet, Rfl: 2 .  levothyroxine (SYNTHROID, LEVOTHROID) 50 MCG tablet, TAKE 1 TABLET BY MOUTH ONCE DAILY IN THE MORNING (Patient taking differently: Take 50 mcg by mouth once a day in the morning), Disp: 90 tablet, Rfl: 0 .  metoprolol succinate (TOPROL XL) 25  MG 24 hr tablet, Take 1 tablet (25 mg total) by mouth daily., Disp: 30 tablet, Rfl: 11 .  naproxen (NAPROSYN) 500 MG tablet, Take 1 tablet (500 mg total) by mouth 2 (two) times daily with a meal. (Patient taking differently: Take 500 mg by mouth 2 (two) times daily as needed (for gout flares/pain). ), Disp: 60 tablet, Rfl: 0 .  pantoprazole (PROTONIX) 40 MG tablet, Take 1 tablet (40 mg total) by mouth daily., Disp: 30 tablet, Rfl: 3 .  vitamin C (ASCORBIC ACID) 500 MG tablet, Take 500 mg by mouth daily., Disp: , Rfl:  .  benzonatate (TESSALON PERLES) 100 MG capsule, Take 1 capsule (100 mg total) by mouth 3 (three) times daily as needed for cough., Disp: 30 capsule, Rfl: 0 Social History    Socioeconomic History  . Marital status: Married    Spouse name: Not on file  . Number of children: Not on file  . Years of education: Not on file  . Highest education level: Not on file  Occupational History  . Not on file  Social Needs  . Financial resource strain: Not on file  . Food insecurity:    Worry: Not on file    Inability: Not on file  . Transportation needs:    Medical: Not on file    Non-medical: Not on file  Tobacco Use  . Smoking status: Former Smoker    Last attempt to quit: 12/19/1971    Years since quitting: 46.0  . Smokeless tobacco: Never Used  Substance and Sexual Activity  . Alcohol use: No  . Drug use: No  . Sexual activity: Not on file  Lifestyle  . Physical activity:    Days per week: Not on file    Minutes per session: Not on file  . Stress: Not on file  Relationships  . Social connections:    Talks on phone: Not on file    Gets together: Not on file    Attends religious service: Not on file    Active member of club or organization: Not on file    Attends meetings of clubs or organizations: Not on file    Relationship status: Not on file  . Intimate partner violence:    Fear of current or ex partner: Not on file    Emotionally abused: Not on file    Physically abused: Not on file    Forced sexual activity: Not on file  Other Topics Concern  . Not on file  Social History Narrative  . Not on file   Family History  Problem Relation Age of Onset  . Colon cancer Brother     Objective: Office vital signs reviewed. BP (!) 150/83   Pulse 88   Temp 97.9 F (36.6 C)   Ht 5\' 9"  (1.753 m)   Wt 205 lb (93 kg)   SpO2 98%   BMI 30.27 kg/m   Physical Examination:  General: Awake, alert, well nourished, nontoxic, No acute distress HEENT: Normal    Neck: No masses palpated. No lymphadenopathy    Ears: Tympanic membranes occluded by cerumen bilaterally    Eyes: PERRLA, extraocular membranes intact, sclera white    Nose: nasal turbinates  moist, clear nasal discharge    Throat: moist mucus membranes, a cobblestone appearance of oropharynx noted, no tonsillar exudate.  Airway is patent Cardio: regular rate and rhythm, S1S2 heard, no murmurs appreciated Pulm: clear to auscultation bilaterally, no wheezes, rhonchi or rales; normal work of breathing on room  air  Assessment/ Plan: 77 y.o. male   1. Persistent cough for 3 weeks or longer He has normal work of breathing with normal oxygen saturation on room air.  Initially with a temperature of 99 F but he had noted it was hot outside.  Repeat was 97.60F.  Difficult to tell if cough is related to esophageal spasm versus allergy mediated versus reflux.  His cardiopulmonary exam was unremarkable today.  He did not appear fluid overloaded.  He is afebrile with no evidence of infectious etiology.  I reviewed his last couple of office visits and his emergency department visit.  I also reviewed his last chest x-ray which is unremarkable.  This was not repeated today.  Tessalon Perles 100 mg p.o. 3 times daily as needed cough prescribed.  Home care instructions were reviewed with the patient.  Reasons for return evaluation discussed.  I did recommend that he see us back in the next 2 weeks for recheck.  In the meantime, I was able to arrange an earlier appointment with 1 of Dr. Marvell FullerGessner's colleagues.  Appointment scheduled for 4/23 at 230.  Patient aware of appointment.  2. Early satiety Concerning for esophageal spasm versus gastritis refractory to PPI (currently on Protonix 40) versus malignancy (patient has a distant smoking history).  Would most certainly benefit from an EGD.  3. Regurgitation of food See above.  4. History of esophageal dilatation See above.  5. Bilateral impacted cerumen Irrigation unsuccessful during today's exam.  I recommended that he use Debrox for the next few days.  He is welcome to come back for repeat attempt irrigation.   No orders of the defined types were  placed in this encounter.  Meds ordered this encounter  Medications  . benzonatate (TESSALON PERLES) 100 MG capsule    Sig: Take 1 capsule (100 mg total) by mouth 3 (three) times daily as needed for cough.    Dispense:  30 capsule    Refill:  0     Chante Mayson Hulen SkainsM Gennie Dib, DO [facility] [phone]

## 2017-12-18 NOTE — Patient Instructions (Addendum)
I do agree that I think this is coming from the upper airway, possibly related to your history of acid reflux and other GI issues.  I have given you a cough suppressant to use 3 times a day if needed for cough.  If you start developing lung symptoms like shortness of breath, coughing up blood, fevers, chills or inability to stay hydrated, please seek immediate medical attention.  In the meantime, I am going to try and get you an earlier appointment with gastroenterology, particularly because you have been having regurgitation, getting full early.  Plan to see Dr. Ermalinda MemosBradshaw in the next couple of weeks for checkup.  You have an appointment with [facility] on December 31, 2017 at 230.  You will be seeing one of Dr. Salem SenateGassner's physician assistants.   Esophageal Spasm An esophageal spasm is a muscle spasm of the tube that connects the back of your throat to your stomach (esophagus). Your esophagus normally moves food and liquids down into your stomach with smooth, wavelike muscle contractions. If you have esophageal spasms, abnormal muscle contractions in your esophagus can be painful and cause you to have difficulty swallowing (dysphagia). There are two types of esophageal spasms. You may have one or both types:  Diffuse esophageal spasms. These are irregular, uncoordinated muscle movements of the esophagus. The diffuse type causes more dysphagia.  Nutcracker esophagus. This is a type of muscle contraction that is coordinated but too strong. The muscles move in a regular order, but the contraction is stronger than necessary. The nutcracker type of esophageal spasm is more painful.  Severe cases of esophageal spasm can make it hard to eat well and do all your usual activities. Esophageal spasms often occur along with severe heartburn (reflux esophagitis). Swallowed foods or liquids may also come back up into your throat (regurgitation). What are the causes? The cause of esophageal spasm is not known. What  increases the risk? You may have a higher risk for esophageal spasm if you:  Are male.  Are an older person.  Eat very quickly.  Swallow foods or drinks that are very hot or very cold.  Are depressed or anxious.  What are the signs or symptoms? Signs and symptoms can vary from day to day. They may be mild or severe. They may last for minutes or hours. Common signs and symptoms include:  Chest pain. This may feel like a heart attack.  Back pain.  Dysphagia.  Heartburn.  The feeling that something is stuck in your throat (globus).  Regurgitation of foods or liquids.  How is this diagnosed? Your health care provider may suspect esophageal spasm based on your symptoms and a physical exam. Tests may be done to help confirm the diagnosis. These may include:  Endoscopy. A flexible telescope is passed into your esophagus.  Barium swallow. An X-ray is done while you drink a substance (contrast material) that shows up on X-ray.  Esophageal manometry. Pressure measurements of the inside of your esophagus are taken while you swallow.  How is this treated? Mild cases of esophageal spasms may not need to be treated. You may be able to manage the spasms by avoiding the foods and eating habits that trigger them. Treatment for spasms that are more severe or frequent can include the following:  You may be given medicine to: ? Relax the esophageal muscles. ? Relieve muscle spasms (calcium channel blockers and nitrates). ? Block nerve endings in the esophagus. This is done with an injection of a certain toxin (  botulinum). ? Relieve heartburn (proton pump inhibitors).  Antidepressant medicines are sometimes used to ease symptoms.  For severe cases, surgery is sometimes done to reduce esophageal muscle contractions (myotomy).  Follow these instructions at home:  Take medicines only as directed by your health care provider.  Do not eat foods that trigger heartburn or esophageal  spasms.  Eat your meals slowly.  Chew your food completely.  Avoid foods and drinks that are very hot or very cold.  Find ways to manage stress.  Ask for help if you struggle with depression or anxiety.  Keep all follow-up visits as directed by your health care provider. This is important. Contact a health care provider if:  Your symptoms change or get worse.  You are losing weight because of dysphagia.  Your medicine is not helping your symptoms.  Your esophageal spasms are interfering with your quality of life. Get help right away if:  You have very bad or unusual chest pain.  You have trouble breathing.  You choke. This information is not intended to replace advice given to you by your health care provider. Make sure you discuss any questions you have with your health care provider. Document Released: 11/17/2002 Document Revised: 02/02/2016 Document Reviewed: 11/20/2013 Elsevier Interactive Patient Education  [employer].

## 2017-12-19 ENCOUNTER — Other Ambulatory Visit: Payer: Self-pay

## 2017-12-19 NOTE — Patient Outreach (Signed)
[facility] Care Management  12/19/2017  Kevin DrownJohnny Flores 06/30/1941 [id number]     EMMI-HF RED ON EMMI ALERT Day # 23 Date: 12/18/17 Red Alert Reason: "Weight? 202 lbs"    Outreach attempt # 1 to patient. Spoke with patient who voices he is doing fine and currently at work.Reviewed and addressed red alert. Patient voices that his weight has slightly gone up. He did not weigh himself today yet. RN CM reviewed with patient wgt gain parameters and when to alert MD. He voiced understanding. He denies any SOB. He voices some edema to ankles at end of day. He attributes it to being on his feet all day at work.He reports that once he gets home and elevates them then they are back to normal. He is adhering to med and diet regimen. He voices no further RN CM needs or concerns at this time.  Advised patient that they would continue to get automated EMMI- HF post discharge calls to assess how they are doing following recent hospitalization and will receive a call from a nurse if any of their responses were abnormal. Patient voiced understanding and was appreciative of f/u call.       Plan:  RN CM will close case as no further interventions needed at this time.   Antionette Fairyoshanda Lilyanah Celestin, RN,BSN,CCM [facility] Care Management Telephonic Care Management Coordinator Direct Phone: [phone] Toll Free: [phone] Fax: [phone]

## 2017-12-31 ENCOUNTER — Ambulatory Visit (INDEPENDENT_AMBULATORY_CARE_PROVIDER_SITE_OTHER): Payer: Medicare Other | Admitting: Gastroenterology

## 2017-12-31 ENCOUNTER — Encounter: Payer: Self-pay | Admitting: Gastroenterology

## 2017-12-31 ENCOUNTER — Other Ambulatory Visit: Payer: Self-pay

## 2017-12-31 VITALS — BP 128/72 | HR 79 | Ht 68.11 in | Wt 200.6 lb

## 2017-12-31 DIAGNOSIS — R1319 Other dysphagia: Secondary | ICD-10-CM | POA: Diagnosis not present

## 2017-12-31 DIAGNOSIS — K219 Gastro-esophageal reflux disease without esophagitis: Secondary | ICD-10-CM

## 2017-12-31 DIAGNOSIS — I2510 Atherosclerotic heart disease of native coronary artery without angina pectoris: Secondary | ICD-10-CM

## 2017-12-31 MED ORDER — PANTOPRAZOLE SODIUM 40 MG PO TBEC: DELAYED_RELEASE_TABLET | ORAL | 1 refills | Status: DC

## 2017-12-31 NOTE — Patient Instructions (Signed)
We have sent the following medications to your pharmacy for you to pick up at your convenience: 1. Pantoprazole Sodium 40 mg  We made you a follow up appointment with Doug SouJessica Zehr PA for Thursday 02-06-2018 at 3:00 PM

## 2017-12-31 NOTE — Progress Notes (Addendum)
12/31/2017 Kevin DrownJohnny Flores [id number] 12/20/1940   HISTORY OF PRESENT ILLNESS: This is a very pleasant 77 year old male who is a patient of Dr. Marvell FullerGessner's.  He presents to our office today at the request of his PCP, Dr. Ermalinda MemosBradshaw, for evaluation regarding heartburn/reflux/indigestion with some dysphagia.  He tells me that for the past 5 or 6 weeks he had been having severe reflux, particularly after meals.  He was also having some trouble swallowing feeling like food was getting hung up.  He says that he had issues with acid reflux 20 or more years ago and had been taking Nexium, but had not been on any medication regularly for several years.  His PCP put him on pantoprazole 40 mg daily for the past few weeks and his symptoms have significantly improved.  Even the dysphagia has improved.  His only ongoing complaint is a dry cough that he says has been present for about 6 months that he has not been able to get rid of.  He tells me that his wife is currently undergoing chemotherapy for cancer and gets treatment every 3 weeks.  She has 3 treatments left.  His last colonoscopy was in June 2012 at which time the study was normal with excellent prep and only internal hemorrhoids, but he had history of 3 small adenomas in 2009, so repeat was recommended in 5 years.  He says that he moves his bowels regularly.  Denies any dark or bloody stools.   Past Medical History:  Diagnosis Date  . Allergy    seasonal  . Arthritis   . CAD (coronary artery disease)    1998 LIMA to the LAD, SVG to circumflex. DES placed to the PDA 11/2009   . Gout   . Heartburn   . Hyperlipidemia   . Hypertension   . Nephrolithiasis    left  . Personal history of colonic polyps    adenomas  . Thyroid disease    Past Surgical History:  Procedure Laterality Date  . BIOPSY PROSTATE    . cardiac stents  2001   and [id number]  . COLONOSCOPY W/ POLYPECTOMY  2009   3 small adenomas  . CORONARY ARTERY BYPASS GRAFT  1998   2 bypass   . cysto with calculus manipulation    . UPPER GASTROINTESTINAL ENDOSCOPY      reports that he quit smoking about 46 years ago. He has never used smokeless tobacco. He reports that he does not drink alcohol or use drugs. family history includes Colon cancer in his brother; Diabetes in his brother and father. Allergies  Allergen Reactions  . Rosuvastatin Other (See Comments)    SEVERE muscle and leg cramps/aches  . Sulfonamide Derivatives Rash and Other (See Comments)    Also "made the whites of my eyes turn yellow"      Outpatient Encounter Medications as of 12/31/2017  Medication Sig  . albuterol (PROVENTIL HFA;VENTOLIN HFA) 108 (90 BASE) MCG/ACT inhaler Inhale 2 puffs into the lungs every 6 (six) hours as needed for wheezing or shortness of breath.  . allopurinol (ZYLOPRIM) 300 MG tablet Take 1 tablet (300 mg total) by mouth daily.  Marland Kitchen. amLODipine (NORVASC) 10 MG tablet Take 1 tablet (10 mg total) by mouth daily.  Marland Kitchen. aspirin 81 MG chewable tablet Chew 1 tablet (81 mg total) by mouth daily.  Marland Kitchen. atorvastatin (LIPITOR) 40 MG tablet Take 1 tablet (40 mg total) by mouth daily at 6 PM.  . benzonatate (TESSALON PERLES) 100 MG capsule  Take 1 capsule (100 mg total) by mouth 3 (three) times daily as needed for cough.  . Cholecalciferol (VITAMIN D-3 PO) Take 1 capsule by mouth daily.  . fish oil-omega-3 fatty acids 1000 MG capsule Take 1 g by mouth at bedtime.   . fluticasone (FLONASE) 50 MCG/ACT nasal spray Place 2 sprays into both nostrils daily.  . furosemide (LASIX) 20 MG tablet Take 1 tablet (20 mg total) by mouth every other day.  . levothyroxine (SYNTHROID, LEVOTHROID) 50 MCG tablet TAKE 1 TABLET BY MOUTH ONCE DAILY IN THE MORNING (Patient taking differently: Take 50 mcg by mouth once a day in the morning)  . metoprolol succinate (TOPROL XL) 25 MG 24 hr tablet Take 1 tablet (25 mg total) by mouth daily.  . naproxen (NAPROSYN) 500 MG tablet Take 1 tablet (500 mg total) by mouth 2 (two) times  daily with a meal. (Patient taking differently: Take 500 mg by mouth 2 (two) times daily as needed (for gout flares/pain). )  . pantoprazole (PROTONIX) 40 MG tablet Take 1 tablet by mouth twice daily.  . vitamin C (ASCORBIC ACID) 500 MG tablet Take 500 mg by mouth daily.  . [DISCONTINUED] pantoprazole (PROTONIX) 40 MG tablet Take 1 tablet (40 mg total) by mouth daily.   No facility-administered encounter medications on file as of 12/31/2017.      REVIEW OF SYSTEMS  : All other systems reviewed and negative except where noted in the History of Present Illness.   PHYSICAL EXAM: BP 128/72   Pulse 79   Ht 5' 8.11" (1.73 m)   Wt 200 lb 9.6 oz (91 kg)   BMI 30.40 kg/m  General: Well developed white male in no acute distress Head: Normocephalic and atraumatic Eyes:  Sclerae anicteric, conjunctiva pink. Ears: Normal auditory acuity Lungs: Clear throughout to auscultation; no increased WOB. Heart: Regular rate and rhythm; no M/R/G. Abdomen: Soft, non-distended.  BS present.  Non-tender. Musculoskeletal: Symmetrical with no gross deformities  Skin: No lesions on visible extremities Extremities: No edema  Neurological: Alert oriented x 4, grossly non-focal Psychological:  Alert and cooperative. Normal mood and affect  ASSESSMENT AND PLAN: *GERD and dysphagia:  Much improved on pantoprazole 40 mg daily.  Still complaining of dry cough, which could be due to reflux as well.  Will increase PPI to BID for 4-6 weeks and see if this helps with the cough.  Can back down to once a day again once symptoms are improved/under control.  We discussed EGD as well but he would like to hold off for now.   *Personal history of colon polyps:  Was due for colonoscopy in 02/2016.  Prefers to wait a little longer until his wife is done with her cancer treatments.  Will re-discuss at his follow-up appt in about 6 weeks.  **Follow-up with me in about 6 weeks.   CC:  [name], MD   Ideally would do an  EGD but since he has responded to PPi that argues in favor of GERD and his wife's health problems impact his care at this time.   Iva Boop, MD, [facility]

## 2017-12-31 NOTE — Patient Outreach (Signed)
[facility] Care Management  12/31/2017  Kevin DrownJohnny Flores 12/03/1940 [id number]     EMMI-HF RED ON EMMI ALERT Day # 35 Date: 12/30/17 Red Alert Reason: "Weight? 201 lbs"   Outreach attempt # 1 to patient. Spoke with patient who is doing well and at work currently.Reviewed and addressed red alert. Patient report that he had forgotten to weigh in the morning so he stepped on the scale that evening after eating dinner and was fully clothed. He attributes the slight weight increase to this. Patient is aware of proper weighing time and technique. He voices that he weighed this morning and weight was 200 lbs. He denies any cardiac and/or respiratory symptoms and voices he is doing just fine. He denies any further RN CM needs or concerns at this time.        Plan: RN CM will close case as no further interventions needed at this time.    Antionette Fairyoshanda Jettson Crable, RN,BSN,CCM [facility] Care Management Telephonic Care Management Coordinator Direct Phone: [phone] Toll Free: [phone] Fax: [phone]

## 2018-01-20 ENCOUNTER — Other Ambulatory Visit: Payer: Self-pay | Admitting: Family Medicine

## 2018-01-28 ENCOUNTER — Encounter: Payer: Self-pay | Admitting: Cardiology

## 2018-02-06 ENCOUNTER — Encounter: Payer: Self-pay | Admitting: Gastroenterology

## 2018-02-06 ENCOUNTER — Ambulatory Visit (INDEPENDENT_AMBULATORY_CARE_PROVIDER_SITE_OTHER): Payer: Medicare Other | Admitting: Gastroenterology

## 2018-02-06 VITALS — BP 130/64 | HR 78 | Ht 68.0 in | Wt 199.0 lb

## 2018-02-06 DIAGNOSIS — I2510 Atherosclerotic heart disease of native coronary artery without angina pectoris: Secondary | ICD-10-CM | POA: Diagnosis not present

## 2018-02-06 DIAGNOSIS — K219 Gastro-esophageal reflux disease without esophagitis: Secondary | ICD-10-CM

## 2018-02-06 NOTE — Progress Notes (Addendum)
02/06/2018 Kevin Flores [id number] 06/16/1941   HISTORY OF PRESENT ILLNESS: This is a 77 year old male who is here for follow-up of his GERD symptoms.  At his last visit 5 weeks ago we increased his Protonix to 40 mg twice daily.  Since that time, he says that about a week into the treatment with the twice daily medication, his symptoms about completely resolved and he has had minimal to no symptoms since that time.  He says that he is feeling great and cannot complain.  We again discussed needing colonoscopy in the near future and possibly do EGD as well.  His wife will be done with her chemo treatments in mid-June and he said that he will call back to schedule those once that is all complete.   Past Medical History:  Diagnosis Date  . Allergy    seasonal  . Arthritis   . CAD (coronary artery disease)    1998 LIMA to the LAD, SVG to circumflex. DES placed to the PDA 11/2009   . Gout   . Heartburn   . Hyperlipidemia   . Hypertension   . Nephrolithiasis    left  . Personal history of colonic polyps    adenomas  . Thyroid disease    Past Surgical History:  Procedure Laterality Date  . BIOPSY PROSTATE    . cardiac stents  2001   and [id number]  . COLONOSCOPY W/ POLYPECTOMY  2009   3 small adenomas  . CORONARY ARTERY BYPASS GRAFT  1998   2 bypass  . cysto with calculus manipulation    . UPPER GASTROINTESTINAL ENDOSCOPY      reports that he quit smoking about 46 years ago. He has never used smokeless tobacco. He reports that he does not drink alcohol or use drugs. family history includes Colon cancer in his brother; Diabetes in his brother and father. Allergies  Allergen Reactions  . Rosuvastatin Other (See Comments)    SEVERE muscle and leg cramps/aches  . Sulfonamide Derivatives Rash and Other (See Comments)    Also "made the whites of my eyes turn yellow"      Outpatient Encounter Medications as of 02/06/2018  Medication Sig  . albuterol (PROVENTIL HFA;VENTOLIN HFA) 108  (90 BASE) MCG/ACT inhaler Inhale 2 puffs into the lungs every 6 (six) hours as needed for wheezing or shortness of breath.  . allopurinol (ZYLOPRIM) 300 MG tablet Take 1 tablet (300 mg total) by mouth daily.  Marland Kitchen amLODipine (NORVASC) 10 MG tablet Take 1 tablet (10 mg total) by mouth daily.  Marland Kitchen aspirin 81 MG chewable tablet Chew 1 tablet (81 mg total) by mouth daily.  Marland Kitchen atorvastatin (LIPITOR) 40 MG tablet Take 1 tablet (40 mg total) by mouth daily at 6 PM.  . Cholecalciferol (VITAMIN D-3 PO) Take 1 capsule by mouth daily.  . fish oil-omega-3 fatty acids 1000 MG capsule Take 1 g by mouth at bedtime.   . fluticasone (FLONASE) 50 MCG/ACT nasal spray Place 2 sprays into both nostrils daily.  . furosemide (LASIX) 20 MG tablet Take 1 tablet (20 mg total) by mouth every other day.  . levothyroxine (SYNTHROID, LEVOTHROID) 50 MCG tablet TAKE 1 TABLET BY MOUTH ONCE DAILY IN THE MORNING (Patient taking differently: Take 50 mcg by mouth once a day in the morning)  . metoprolol succinate (TOPROL XL) 25 MG 24 hr tablet Take 1 tablet (25 mg total) by mouth daily.  . naproxen (NAPROSYN) 500 MG tablet Take 1 tablet (500 mg total)  by mouth 2 (two) times daily with a meal. (Patient taking differently: Take 500 mg by mouth 2 (two) times daily as needed (for gout flares/pain). )  . pantoprazole (PROTONIX) 40 MG tablet Take 1 tablet by mouth twice daily.  . vitamin C (ASCORBIC ACID) 500 MG tablet Take 500 mg by mouth daily.  . [DISCONTINUED] benzonatate (TESSALON PERLES) 100 MG capsule Take 1 capsule (100 mg total) by mouth 3 (three) times daily as needed for cough. (Patient not taking: Reported on 02/06/2018)  . [DISCONTINUED] furosemide (LASIX) 20 MG tablet TAKE 1 TABLET BY MOUTH ONCE DAILY (Patient not taking: Reported on 02/06/2018)   No facility-administered encounter medications on file as of 02/06/2018.      REVIEW OF SYSTEMS  : All other systems reviewed and negative except where noted in the History of Present  Illness.   PHYSICAL EXAM: BP 130/64   Pulse 78   Ht  (1.727 m)   Wt 199 lb (90.3 kg)   BMI 30.26 kg/m  General: Well developed white male in no acute distress Head: Normocephalic and atraumatic Eyes:  Sclerae anicteric, conjunctiva pink. Ears: Normal auditory acuity Lungs: Clear throughout to auscultation; no increased WOB. Heart: Regular rate and rhythm; no M/R/G. Abdomen: Soft, non-distended.  BS present.  Non-tender. Musculoskeletal: Symmetrical with no gross deformities  Skin: No lesions on visible extremities Extremities: No edema  Neurological: Alert oriented x 4, grossly non-focal Psychological:  Alert and cooperative. Normal mood and affect  ASSESSMENT AND PLAN: *GERD and dysphagia:  Much improved/about resolved on pantoprazole 40 mg BID.  He has been on twice daily PPI for about 5 weeks.  He will decrease back to once daily and see how he does in regards to symptoms.  If symptoms return he can increase back to twice a day, but I have asked him to inform us if he does so. *Personal history of colon polyps:  Was due for colonoscopy in 02/2016.  Prefers to wait a little longer until his wife is done with her cancer treatments.  He promises to call us back to schedule in the near future.  I have discussed with him possibly doing EGD at that time as well.  CC:  [name], MD  Agree with Ms. Rise Mu management.  Iva Boop, MD, [facility]

## 2018-02-06 NOTE — Patient Instructions (Signed)
Decrease your Pantoprazole back to once daily.  Give [place] a call if you feel you need to increase it back.  Call when you are ready to schedule a colonoscopy

## 2018-02-13 ENCOUNTER — Ambulatory Visit: Payer: Medicare Other | Admitting: Internal Medicine

## 2018-02-19 ENCOUNTER — Ambulatory Visit: Payer: Medicare Other | Admitting: Cardiology

## 2018-02-20 ENCOUNTER — Other Ambulatory Visit: Payer: Self-pay | Admitting: Family Medicine

## 2018-03-10 ENCOUNTER — Other Ambulatory Visit: Payer: Self-pay | Admitting: Gastroenterology

## 2018-03-19 ENCOUNTER — Other Ambulatory Visit: Payer: Self-pay | Admitting: Family Medicine

## 2018-04-01 NOTE — Progress Notes (Signed)
Cardiology Office Note   Date:  04/02/2018   ID:  Kevin Flores, DOB 03-13-1941, MRN [id number]  PCP:  [name], MD  Cardiologist:   Rollene Rotunda, MD   Chief Complaint  Patient presents with  . Leg Swelling      History of Present Illness: Kevin Flores is a 77 y.o. male who presents follow up of CAD.   He has a history of CAD s/p CABG 1998, DES RCA 2001, DES PDA 2011 (LIMA-LAD & SVG-OM ok).   He was admitted 3/15-3/18/2019 for bradycardia and CHF exacerbation, weight at discharge 198 pounds.  His reatinine was 2.05 at discharge and  1.68 at recheck 3/22.  Myoview was intermediate risk but because of high risk of AKI, he was managed medically.    When he came back to see [place] in follow-up he was having some symptoms consistent with heartburn but this improved with Prilosec.  He is been working 3 days/week.  He is on his feet quite a bit and doing a lot of walking working at an [employer] in [employer].  He says he does well with this.  He does some golfing.  He is having none of the shortness of breath that he presented with earlier this year.  He is not describing any PND or orthopnea.  Is not having any palpitations, presyncope or syncope.  He is not bothered by his PVCs.  His leg swelling is minimal.  He watches his weight and his salt.  Past Medical History:  Diagnosis Date  . Allergy    seasonal  . Arthritis   . CAD (coronary artery disease)    1998 LIMA to the LAD, SVG to circumflex. DES placed to the PDA 11/2009   . Gout   . Heartburn   . Hyperlipidemia   . Hypertension   . Nephrolithiasis    left  . Personal history of colonic polyps    adenomas  . Thyroid disease     Past Surgical History:  Procedure Laterality Date  . BIOPSY PROSTATE    . cardiac stents  2001   and [id number]  . COLONOSCOPY W/ POLYPECTOMY  2009   3 small adenomas  . CORONARY ARTERY BYPASS GRAFT  1998   2 bypass  . cysto with calculus manipulation    . UPPER GASTROINTESTINAL ENDOSCOPY        Current Outpatient Medications  Medication Sig Dispense Refill  . albuterol (PROVENTIL HFA;VENTOLIN HFA) 108 (90 BASE) MCG/ACT inhaler Inhale 2 puffs into the lungs every 6 (six) hours as needed for wheezing or shortness of breath. 1 Inhaler 3  . allopurinol (ZYLOPRIM) 300 MG tablet Take 1 tablet (300 mg total) by mouth daily. 90 tablet 3  . amLODipine (NORVASC) 10 MG tablet Take 1 tablet (10 mg total) by mouth daily. 90 tablet 3  . aspirin 81 MG chewable tablet Chew 1 tablet (81 mg total) by mouth daily. 90 tablet 3  . atorvastatin (LIPITOR) 20 MG tablet Take 20 mg by mouth daily at 6 PM.     . Cholecalciferol (VITAMIN D-3 PO) Take 1 capsule by mouth daily.    . fish oil-omega-3 fatty acids 1000 MG capsule Take 1 g by mouth at bedtime.     . fluticasone (FLONASE) 50 MCG/ACT nasal spray Place 2 sprays into both nostrils daily. 16 g 6  . furosemide (LASIX) 20 MG tablet TAKE 1 TABLET BY MOUTH ONCE DAILY 30 tablet 1  . levothyroxine (SYNTHROID, LEVOTHROID) 50 MCG  tablet TAKE 1 TABLET BY MOUTH ONCE DAILY IN THE MORNING 90 tablet 0  . metoprolol succinate (TOPROL XL) 25 MG 24 hr tablet Take 1 tablet (25 mg total) by mouth daily. 30 tablet 11  . naproxen (NAPROSYN) 500 MG tablet Take 1 tablet (500 mg total) by mouth 2 (two) times daily with a meal. (Patient taking differently: Take 500 mg by mouth 2 (two) times daily as needed (for gout flares/pain). ) 60 tablet 0  . pantoprazole (PROTONIX) 40 MG tablet TAKE 1 TABLET BY MOUTH TWICE DAILY 60 tablet 1  . vitamin C (ASCORBIC ACID) 500 MG tablet Take 500 mg by mouth daily.     No current facility-administered medications for this visit.     Allergies:   Imdur [isosorbide dinitrate]; Rosuvastatin; and Sulfonamide derivatives    ROS:  Please see the history of present illness.   Otherwise, review of systems are positive for none.   All other systems are reviewed and negative.    PHYSICAL EXAM: VS:  BP 140/80   Pulse 76   Ht 5\' 9"  (1.753 m)    Wt 202 lb (91.6 kg)   BMI 29.83 kg/m  , BMI Body mass index is 29.83 kg/m.  GENERAL:  Well appearing NECK:  No jugular venous distention, waveform within normal limits, carotid upstroke brisk and symmetric, no bruits, no thyromegaly LUNGS:  Clear to auscultation bilaterally CHEST:  Well healed sternotomy scar. HEART:  PMI not displaced or sustained,S1 and S2 within normal limits, no S3, no S4, no clicks, no rubs, 2 out of 6 apical systolic murmur brief, no diastolic murmurs ABD:  Flat, positive bowel sounds normal in frequency in pitch, no bruits, no rebound, no guarding, no midline pulsatile mass, no hepatomegaly, no splenomegaly EXT:  2 plus pulses throughout, trace edema, no cyanosis no clubbing    EKG:  EKG is not ordered today.    Recent Labs: 08/15/2017: ALT 12 11/22/2017: B Natriuretic Peptide 564.4 11/25/2017: Hemoglobin 14.7; Magnesium 2.0; Platelets 134 11/29/2017: BUN 19; Creatinine, Ser 1.68; Potassium 4.6; Sodium 146    Lipid Panel    Component Value Date/Time   CHOL 123 05/21/2016 0915   TRIG 105 05/21/2016 0915   HDL 31 (L) 05/21/2016 0915   CHOLHDL 4.0 05/21/2016 0915   CHOLHDL 4.7 11/18/2009 0347   VLDL 27 11/18/2009 0347   LDLCALC 71 05/21/2016 0915   LDLDIRECT 77 09/12/2017 1345      Wt Readings from Last 3 Encounters:  04/02/18 202 lb (91.6 kg)  02/06/18 199 lb (90.3 kg)  12/31/17 200 lb 9.6 oz (91 kg)      Other studies Reviewed: Additional studies/ records that were reviewed today include: Labs. Review of the above records demonstrates:  Please see elsewhere in the note.     ASSESSMENT AND PLAN:  CHRONIC DIASTOLIC HF:   He seems to be euvolemic.  At this point no change in therapy is indicated.  CAD:  The patient has no new sypmtoms.  No further cardiovascular testing is indicated.  We will continue with aggressive risk reduction and meds as listed.  HTN:  The blood pressure is at target. No change in medications is indicated. We will  continue with therapeutic lifestyle changes (TLC).  PVCs:   He continues to have PVCs but is not feeling this.  No change in therapy is planned.   Current medicines are reviewed at length with the patient today.  The patient does not have concerns regarding medicines.  The following  changes have been made:  no change  Labs/ tests ordered today include:   Orders Placed This Encounter  Procedures  . Basic metabolic panel     Disposition:   FU with me in six months.     Signed, Rollene RotundaJames Astin Sayre, MD  04/02/2018 3:30 PM    [facility]

## 2018-04-02 ENCOUNTER — Ambulatory Visit (INDEPENDENT_AMBULATORY_CARE_PROVIDER_SITE_OTHER): Payer: Medicare Other | Admitting: Cardiology

## 2018-04-02 ENCOUNTER — Other Ambulatory Visit: Payer: Medicare Other

## 2018-04-02 ENCOUNTER — Encounter: Payer: Self-pay | Admitting: Cardiology

## 2018-04-02 VITALS — BP 140/80 | HR 76 | Ht 69.0 in | Wt 202.0 lb

## 2018-04-02 DIAGNOSIS — I10 Essential (primary) hypertension: Secondary | ICD-10-CM

## 2018-04-02 DIAGNOSIS — Z79899 Other long term (current) drug therapy: Secondary | ICD-10-CM

## 2018-04-02 DIAGNOSIS — I493 Ventricular premature depolarization: Secondary | ICD-10-CM | POA: Diagnosis not present

## 2018-04-02 DIAGNOSIS — I2510 Atherosclerotic heart disease of native coronary artery without angina pectoris: Secondary | ICD-10-CM

## 2018-04-02 DIAGNOSIS — M7989 Other specified soft tissue disorders: Secondary | ICD-10-CM

## 2018-04-02 DIAGNOSIS — I5032 Chronic diastolic (congestive) heart failure: Secondary | ICD-10-CM

## 2018-04-02 NOTE — Patient Instructions (Signed)
Medication Instructions:  The current medical regimen is effective;  continue present plan and medications.  Labwork: Please have blood work today (BMP)  Follow-Up: Follow up in 6 months with Dr. [name].  You will receive a letter in the mail 2 months before you are due.  Please call us when you receive this letter to schedule your follow up appointment.  If you need a refill on your cardiac medications before your next appointment, please call your pharmacy.  Thank you for choosing [facility]!!

## 2018-04-03 LAB — BASIC METABOLIC PANEL
BUN/Creatinine Ratio: 10 (ref 10–24)
BUN: 17 mg/dL (ref 8–27)
CALCIUM: 9.7 mg/dL (ref 8.6–10.2)
CHLORIDE: 103 mmol/L (ref 96–106)
CO2: 25 mmol/L (ref 20–29)
Creatinine, Ser: 1.63 mg/dL — ABNORMAL HIGH (ref 0.76–1.27)
GFR calc Af Amer: 47 mL/min/{1.73_m2} — ABNORMAL LOW (ref >59)
GFR, EST NON AFRICAN AMERICAN: 41 mL/min/{1.73_m2} — AB (ref >59)
Glucose: 107 mg/dL — ABNORMAL HIGH (ref 65–99)
POTASSIUM: 3.8 mmol/L (ref 3.5–5.2)
Sodium: 142 mmol/L (ref 134–144)

## 2018-05-27 ENCOUNTER — Other Ambulatory Visit: Payer: Self-pay | Admitting: *Deleted

## 2018-05-27 MED ORDER — LEVOTHYROXINE SODIUM 50 MCG PO TABS: ORAL_TABLET | ORAL | 0 refills | Status: DC

## 2018-06-12 ENCOUNTER — Encounter: Payer: Self-pay | Admitting: Family

## 2018-06-12 ENCOUNTER — Ambulatory Visit (INDEPENDENT_AMBULATORY_CARE_PROVIDER_SITE_OTHER): Payer: Medicare Other | Admitting: Family

## 2018-06-12 VITALS — BP 148/82 | HR 69 | Temp 97.9°F | Ht 69.0 in | Wt 206.2 lb

## 2018-06-12 DIAGNOSIS — I2510 Atherosclerotic heart disease of native coronary artery without angina pectoris: Secondary | ICD-10-CM

## 2018-06-12 DIAGNOSIS — M545 Low back pain, unspecified: Secondary | ICD-10-CM

## 2018-06-12 DIAGNOSIS — Z23 Encounter for immunization: Secondary | ICD-10-CM | POA: Diagnosis not present

## 2018-06-12 DIAGNOSIS — S39012A Strain of muscle, fascia and tendon of lower back, initial encounter: Secondary | ICD-10-CM

## 2018-06-12 MED ORDER — PREDNISONE 10 MG (21) PO TBPK: ORAL_TABLET | ORAL | 0 refills | Status: DC

## 2018-06-12 MED ORDER — NAPROXEN 500 MG PO TABS: 500.0000 mg | ORAL_TABLET | Freq: Two times a day (BID) | ORAL | 1 refills | Status: DC | PRN

## 2018-06-12 NOTE — Patient Instructions (Signed)
Low Back Sprain  A sprain is a stretch or tear in the bands of tissue that hold bones and joints together (ligaments). Sprains of the lower back (lumbar spine) are a common cause of low back pain. A sprain occurs when ligaments are overextended or stretched beyond their limits. The ligaments can become inflamed, resulting in pain and sudden muscle tightening (spasms). A sprain can be caused by an injury (trauma), or it can develop gradually due to overuse.  There are three types of sprains:  · Grade 1 is a mild sprain involving an overstretched ligament or a very slight tear of the ligament.  · Grade 2 is a moderate sprain involving a partial tear of the ligament.  · Grade 3 is a severe sprain involving a complete tear of the ligament.    What are the causes?  This condition may be caused by:  · Trauma, such as a fall or a hit to the body.  · Twisting or overstretching the back. This may result from doing activities that require a lot of energy, such as lifting heavy objects.    What increases the risk?  The following factors may increase your risk of getting this condition:  · Playing contact sports.  · Participating in sports or activities that put excessive stress on the back and require a lot of bending and twisting, including:  ? Lifting weights or heavy objects.  ? Gymnastics.  ? Soccer.  ? Figure skating.  ? Snowboarding.  · Being overweight or obese.  · Having poor strength and flexibility.    What are the signs or symptoms?  Symptoms of this condition may include:  · Sharp or dull pain in the lower back that does not go away. Pain may extend to the buttocks.  · Stiffness.  · Limited range of motion.  · Inability to stand up straight due to stiffness or pain.  · Muscle spasms.    How is this diagnosed?    This condition may be diagnosed based on:  · Your symptoms.  · Your medical history.  · A physical exam.  ? Your health care provider may push on certain areas of your back to determine the source of your  pain.  ? You may be asked to bend forward, backward, and side to side to assess the severity of your pain and your range of motion.  · Imaging tests, such as:  ? X-rays.  ? MRI.    How is this treated?  Treatment for this condition may include:  · Applying heat and cold to the affected area.  · Medicines to help relieve pain and to relax your muscles (muscle relaxants).  · NSAIDs to help reduce swelling and discomfort.  · Physical therapy.    When your symptoms improve, it is important to gradually return to your normal routine as soon as possible to reduce pain, avoid stiffness, and avoid loss of muscle strength. Generally, symptoms should improve within 6 weeks of treatment. However, recovery time varies.  Follow these instructions at home:  Managing pain, stiffness, and swelling  · If directed, apply ice to the injured area during the first 24 hours after your injury.  ? Put ice in a plastic bag.  ? Place a towel between your skin and the bag.  ? Leave the ice on for 20 minutes, 2-3 times a day.  · If directed, apply heat to the affected area as often as told by your health care provider. Use the   heat source that your health care provider recommends, such as a moist heat pack or a heating pad.  ? Place a towel between your skin and the heat source.  ? Leave the heat on for 20-30 minutes.  ? Remove the heat if your skin turns bright red. This is especially important if you are unable to feel pain, heat, or cold. You may have a greater risk of getting burned.  Activity  · Rest and return to your normal activities as told by your health care provider. Ask your health care provider what activities are safe for you.  · Avoid activities that take a lot of effort (are strenuous) for as long as told by your health care provider.  · Do exercises as told by your health care provider.  General instructions    · Take over-the-counter and prescription medicines only as told by your health care provider.  · If you have  questions or concerns about safety while taking pain medicine, talk with your health care provider.  · Do not drive or operate heavy machinery until you know how your pain medicine affects you.  · Do not use any tobacco products, such as cigarettes, chewing tobacco, and e-cigarettes. Tobacco can delay bone healing. If you need help quitting, ask your health care provider.  · Keep all follow-up visits as told by your health care provider. This is important.  How is this prevented?  · Warm up and stretch before being active.  · Cool down and stretch after being active.  · Give your body time to rest between periods of activity.  · Avoid:  ? Being physically inactive for long periods at a time.  ? Exercising or playing sports when you are tired or in pain.  · Use correct form when playing sports and lifting heavy objects.  · Use good posture when sitting and standing.  · Maintain a healthy weight.  · Sleep on a mattress with medium firmness to support your back.  · Make sure to use equipment that fits you, including shoes that fit well.  · Be safe and responsible while being active to avoid falls.  · Do at least 150 minutes of moderate-intensity exercise each week, such as brisk walking or water aerobics. Try a form of exercise that takes stress off your back, such as swimming or stationary cycling.  · Maintain physical fitness, including:  ? Strength. In particular, develop and maintain strong abdominal muscles.  ? Flexibility.  ? Cardiovascular fitness.  ? Endurance.  Contact a health care provider if:  · Your back pain does not improve after 6 weeks of treatment.  · Your symptoms get worse.  Get help right away if:  · Your back pain is severe.  · You are unable to stand or walk.  · You develop pain in your legs.  · You develop weakness in your buttocks or legs.  · You have difficulty controlling when you urinate or when you have a bowel movement.  This information is not intended to replace advice given to you by  your health care provider. Make sure you discuss any questions you have with your health care provider.  Document Released: 08/27/2005 Document Revised: 05/03/2016 Document Reviewed: 06/08/2015  Elsevier Interactive Patient Education © 2018 [employer]

## 2018-06-12 NOTE — Progress Notes (Signed)
   Subjective:    Patient ID: Kevin Flores, male    DOB: [birth year], 77 y.o.   MRN: [id number]  Chief Complaint  Patient presents with  . Back Pain    Back Pain  This is a new problem. The current episode started in the past 7 days (Wednesday after riding a zero turn lawn mower for three hours). The problem occurs constantly. The problem has been waxing and waning since onset. The pain is present in the lumbar spine. The quality of the pain is described as aching. The pain does not radiate. The pain is at a severity of 7/10. The pain is moderate. The symptoms are aggravated by standing and sitting. Pertinent negatives include no bladder incontinence, bowel incontinence or leg pain. Treatments tried: heating pad. The treatment provided mild relief.      Review of Systems  Gastrointestinal: Negative for bowel incontinence.  Genitourinary: Negative for bladder incontinence.  Musculoskeletal: Positive for back pain.  All other systems reviewed and are negative.      Objective:   Physical Exam  Constitutional: He is oriented to person, place, and time. He appears well-developed and well-nourished. No distress.  HENT:  Head: Normocephalic.  Eyes: Pupils are equal, round, and reactive to light. Right eye exhibits no discharge. Left eye exhibits no discharge.  Neck: Normal range of motion. Neck supple. No thyromegaly present.  Cardiovascular: Normal rate, regular rhythm, normal heart sounds and intact distal pulses.  No murmur heard. Pulmonary/Chest: Effort normal and breath sounds normal. No respiratory distress. He has no wheezes.  Abdominal: Soft. Bowel sounds are normal. He exhibits no distension. There is no tenderness.  Musculoskeletal: He exhibits no edema or tenderness.  Low lumbar with flexion and extension   Neurological: He is alert and oriented to person, place, and time. He has normal reflexes. No cranial nerve deficit.  Skin: Skin is warm and dry. No rash noted. No erythema.   Psychiatric: He has a normal mood and affect. His behavior is normal. Judgment and thought content normal.  Vitals reviewed.     BP (!) 148/82   Pulse 69   Temp 97.9 F (36.6 C) (Oral)   Ht 5\' 9"  (1.753 m)   Wt 206 lb 3.2 oz (93.5 kg)   BMI 30.45 kg/m      Assessment & Plan:  Kevin Flores comes in today with chief complaint of Back Pain   Diagnosis and orders addressed:  1. Strain of lumbar region, initial encounter - predniSONE (STERAPRED UNI-PAK 21 TAB) 10 MG (21) TBPK tablet; Use as directed  Dispense: 21 tablet; Refill: 0 - naproxen (NAPROSYN) 500 MG tablet; Take 1 tablet (500 mg total) by mouth 2 (two) times daily as needed (for gout flares/pain).  Dispense: 60 tablet; Refill: 1  2. Acute bilateral low back pain without sciatica - predniSONE (STERAPRED UNI-PAK 21 TAB) 10 MG (21) TBPK tablet; Use as directed  Dispense: 21 tablet; Refill: 0 - naproxen (NAPROSYN) 500 MG tablet; Take 1 tablet (500 mg total) by mouth 2 (two) times daily as needed (for gout flares/pain).  Dispense: 60 tablet; Refill: 1  Rest Ice  ROM exercises discussed Restart naprosyn BID with foods for next few days RTO if symptoms worsen or do not improve   Jannifer Rodney, FNP

## 2018-06-19 ENCOUNTER — Inpatient Hospital Stay (HOSPITAL_COMMUNITY): Payer: Medicare Other | Admitting: Occupational Therapy

## 2018-06-23 ENCOUNTER — Other Ambulatory Visit: Payer: Self-pay | Admitting: Family Medicine

## 2018-06-23 NOTE — Telephone Encounter (Signed)
Last thyroid 05/21/16   

## 2018-07-18 ENCOUNTER — Ambulatory Visit (INDEPENDENT_AMBULATORY_CARE_PROVIDER_SITE_OTHER): Payer: Medicare Other | Admitting: Family

## 2018-07-18 ENCOUNTER — Encounter: Payer: Self-pay | Admitting: Family

## 2018-07-18 VITALS — BP 133/79 | HR 75 | Temp 97.6°F | Ht 69.0 in | Wt 206.0 lb

## 2018-07-18 DIAGNOSIS — K219 Gastro-esophageal reflux disease without esophagitis: Secondary | ICD-10-CM

## 2018-07-18 DIAGNOSIS — E039 Hypothyroidism, unspecified: Secondary | ICD-10-CM

## 2018-07-18 DIAGNOSIS — Z1211 Encounter for screening for malignant neoplasm of colon: Secondary | ICD-10-CM

## 2018-07-18 DIAGNOSIS — I509 Heart failure, unspecified: Secondary | ICD-10-CM

## 2018-07-18 DIAGNOSIS — E663 Overweight: Secondary | ICD-10-CM | POA: Diagnosis not present

## 2018-07-18 DIAGNOSIS — Z1212 Encounter for screening for malignant neoplasm of rectum: Secondary | ICD-10-CM | POA: Diagnosis not present

## 2018-07-18 DIAGNOSIS — E785 Hyperlipidemia, unspecified: Secondary | ICD-10-CM

## 2018-07-18 DIAGNOSIS — I2510 Atherosclerotic heart disease of native coronary artery without angina pectoris: Secondary | ICD-10-CM

## 2018-07-18 DIAGNOSIS — I10 Essential (primary) hypertension: Secondary | ICD-10-CM | POA: Diagnosis not present

## 2018-07-18 DIAGNOSIS — E8881 Metabolic syndrome: Secondary | ICD-10-CM

## 2018-07-18 DIAGNOSIS — E559 Vitamin D deficiency, unspecified: Secondary | ICD-10-CM | POA: Diagnosis not present

## 2018-07-18 MED ORDER — LEVOTHYROXINE SODIUM 50 MCG PO TABS: ORAL_TABLET | ORAL | 2 refills | Status: DC

## 2018-07-18 MED ORDER — PANTOPRAZOLE SODIUM 40 MG PO TBEC: 40.0000 mg | DELAYED_RELEASE_TABLET | Freq: Every day | ORAL | 2 refills | Status: DC

## 2018-07-18 MED ORDER — METOPROLOL SUCCINATE ER 25 MG PO TB24: 25.0000 mg | ORAL_TABLET | Freq: Every day | ORAL | 4 refills | Status: DC

## 2018-07-18 MED ORDER — FUROSEMIDE 20 MG PO TABS: 20.0000 mg | ORAL_TABLET | ORAL | 1 refills | Status: DC

## 2018-07-18 NOTE — Patient Instructions (Signed)

## 2018-07-18 NOTE — Progress Notes (Signed)
Subjective:    Patient ID: Kevin Flores, male    DOB: [birth year], 77 y.o.   MRN: [id number]  Chief Complaint  Patient presents with  . Medical Management of Chronic Issues    refills   Pt presents to the office today for chronic follow up. He is followed by a Cardiologists every 6 months  For CHF and CAD.  Hypertension  This is a chronic problem. The current episode started more than 1 year ago. The problem has been resolved since onset. The problem is controlled. Pertinent negatives include no chest pain, headaches, peripheral edema or shortness of breath. Risk factors for coronary artery disease include dyslipidemia and male gender. The current treatment provides moderate improvement. Hypertensive end-organ damage includes heart failure. Identifiable causes of hypertension include a thyroid problem.  Congestive Heart Failure  Presents for follow-up visit. Pertinent negatives include no chest pain, chest pressure, claudication, fatigue or shortness of breath. The symptoms have been stable.  Thyroid Problem  Presents for follow-up visit. Symptoms include dry skin. Patient reports no constipation, diarrhea, fatigue or heat intolerance. The symptoms have been stable. His past medical history is significant for heart failure and hyperlipidemia.  Gastroesophageal Reflux  He reports no chest pain. This is a chronic problem. The current episode started more than 1 year ago. The problem occurs occasionally. The problem has been waxing and waning. The symptoms are aggravated by certain foods. Pertinent negatives include no fatigue. He has tried a PPI for the symptoms. The treatment provided moderate relief.  Hyperlipidemia  This is a chronic problem. The current episode started more than 1 year ago. The problem is controlled. Recent lipid tests were reviewed and are normal. Pertinent negatives include no chest pain or shortness of breath. Current antihyperlipidemic treatment includes statins. The  current treatment provides moderate improvement of lipids. Risk factors for coronary artery disease include dyslipidemia, hypertension, male sex and a sedentary lifestyle.  Arthritis  Presents for follow-up (gout) visit. The symptoms have been resolved. His pain is at a severity of 0/10. Pertinent negatives include no diarrhea or fatigue.  Metabolic Syndrome He continues to work 27 hours and then mows 30 yards during. States he is very active. He admits he eats what he wants, but a "lot of vegetables".    Review of Systems  Constitutional: Negative for fatigue.  Respiratory: Negative for shortness of breath.   Cardiovascular: Negative for chest pain and claudication.  Gastrointestinal: Negative for constipation and diarrhea.  Endocrine: Negative for heat intolerance.  Musculoskeletal: Positive for arthritis.  Neurological: Negative for headaches.  All other systems reviewed and are negative.      Objective:   Physical Exam  Constitutional: He is oriented to person, place, and time. He appears well-developed and well-nourished. No distress.  HENT:  Head: Normocephalic.  Right Ear: External ear normal.  Left Ear: External ear normal.  Mouth/Throat: Oropharynx is clear and moist.  Eyes: Pupils are equal, round, and reactive to light. Right eye exhibits no discharge. Left eye exhibits no discharge.  Neck: Normal range of motion. Neck supple. No thyromegaly present.  Cardiovascular: Normal rate, regular rhythm, normal heart sounds and intact distal pulses.  No murmur heard. Pulmonary/Chest: Effort normal and breath sounds normal. No respiratory distress. He has no wheezes.  Abdominal: Soft. Bowel sounds are normal. He exhibits no distension. There is no tenderness.  Musculoskeletal: Normal range of motion. He exhibits no edema or tenderness.  Neurological: He is alert and oriented to person, place, and time. He  has normal reflexes. No cranial nerve deficit.  Skin: Skin is warm and dry.  No rash noted. No erythema.  Psychiatric: He has a normal mood and affect. His behavior is normal. Judgment and thought content normal.  Vitals reviewed.     BP 133/79   Pulse 75   Temp 97.6 F (36.4 C) (Oral)   Ht '5\' 9"'  (1.753 m)   Wt 206 lb (93.4 kg)   BMI 30.42 kg/m      Assessment & Plan:  Kevin Flores comes in today with chief complaint of Medical Management of Chronic Issues (refills)   Diagnosis and orders addressed:  1. Essential hypertension - metoprolol succinate (TOPROL XL) 25 MG 24 hr tablet; Take 1 tablet (25 mg total) by mouth daily.  Dispense: 90 tablet; Refill: 4 - CMP14+EGFR - CBC with Differential/Platelet  2. Atherosclerosis of native coronary artery of native heart without angina pectoris - furosemide (LASIX) 20 MG tablet; Take 1 tablet (20 mg total) by mouth every other day.  Dispense: 90 tablet; Refill: 1 - CMP14+EGFR - CBC with Differential/Platelet - Lipid panel  3. Gastroesophageal reflux disease, esophagitis presence not specified - pantoprazole (PROTONIX) 40 MG tablet; Take 1 tablet (40 mg total) by mouth daily.  Dispense: 90 tablet; Refill: 2 - CMP14+EGFR - CBC with Differential/Platelet  4. Hypothyroidism, unspecified type - levothyroxine (SYNTHROID, LEVOTHROID) 50 MCG tablet; TAKE 1 TABLET BY MOUTH IN THE MORNING  Dispense: 90 tablet; Refill: 2 - CMP14+EGFR - CBC with Differential/Platelet - TSH  5. Hyperlipidemia, unspecified hyperlipidemia type - CMP14+EGFR - CBC with Differential/Platelet  6. Overweight (BMI 25.0-29.9) - CMP14+EGFR - CBC with Differential/Platelet  7. Metabolic syndrome - MCN47+SJGG - CBC with Differential/Platelet  8. Vitamin D deficiency - CMP14+EGFR - CBC with Differential/Platelet - VITAMIN D 25 Hydroxy (Vit-D Deficiency, Fractures)  9. Colon cancer screening - Cologuard - CMP14+EGFR - CBC with Differential/Platelet  10. Screening for malignant neoplasm of the rectum - Cologuard - CMP14+EGFR -  CBC with Differential/Platelet  11. Congestive heart failure, unspecified HF chronicity, unspecified heart failure type (HCC) - furosemide (LASIX) 20 MG tablet; Take 1 tablet (20 mg total) by mouth every other day.  Dispense: 90 tablet; Refill: 1   Labs pending Health Maintenance reviewed Diet and exercise encouraged  Follow up plan: 6 months     Evelina Dun, FNP

## 2018-07-19 LAB — CBC WITH DIFFERENTIAL/PLATELET
BASOS ABS: 0 10*3/uL (ref 0.0–0.2)
Basos: 0 %
EOS (ABSOLUTE): 0.1 10*3/uL (ref 0.0–0.4)
Eos: 2 %
Hematocrit: 44.5 % (ref 37.5–51.0)
Hemoglobin: 15.5 g/dL (ref 13.0–17.7)
IMMATURE GRANS (ABS): 0 10*3/uL (ref 0.0–0.1)
Immature Granulocytes: 0 %
LYMPHS: 18 %
Lymphocytes Absolute: 1.6 10*3/uL (ref 0.7–3.1)
MCH: 30.3 pg (ref 26.6–33.0)
MCHC: 34.8 g/dL (ref 31.5–35.7)
MCV: 87 fL (ref 79–97)
Monocytes Absolute: 1.1 10*3/uL — ABNORMAL HIGH (ref 0.1–0.9)
Monocytes: 12 %
NEUTROS ABS: 6 10*3/uL (ref 1.4–7.0)
Neutrophils: 68 %
PLATELETS: 217 10*3/uL (ref 150–450)
RBC: 5.12 x10E6/uL (ref 4.14–5.80)
RDW: 13.6 % (ref 12.3–15.4)
WBC: 8.9 10*3/uL (ref 3.4–10.8)

## 2018-07-19 LAB — LIPID PANEL
CHOLESTEROL TOTAL: 120 mg/dL (ref 100–199)
Chol/HDL Ratio: 3.6 ratio (ref 0.0–5.0)
HDL: 33 mg/dL — ABNORMAL LOW (ref >39)
LDL Calculated: 61 mg/dL (ref 0–99)
Triglycerides: 130 mg/dL (ref 0–149)
VLDL CHOLESTEROL CAL: 26 mg/dL (ref 5–40)

## 2018-07-19 LAB — CMP14+EGFR
A/G RATIO: 1.7 (ref 1.2–2.2)
ALK PHOS: 114 IU/L (ref 39–117)
ALT: 18 IU/L (ref 0–44)
AST: 22 IU/L (ref 0–40)
Albumin: 4.3 g/dL (ref 3.5–4.8)
BILIRUBIN TOTAL: 0.7 mg/dL (ref 0.0–1.2)
BUN/Creatinine Ratio: 11 (ref 10–24)
BUN: 18 mg/dL (ref 8–27)
CHLORIDE: 103 mmol/L (ref 96–106)
CO2: 25 mmol/L (ref 20–29)
Calcium: 9.7 mg/dL (ref 8.6–10.2)
Creatinine, Ser: 1.68 mg/dL — ABNORMAL HIGH (ref 0.76–1.27)
GFR calc non Af Amer: 39 mL/min/{1.73_m2} — ABNORMAL LOW (ref >59)
GFR, EST AFRICAN AMERICAN: 45 mL/min/{1.73_m2} — AB (ref >59)
Globulin, Total: 2.6 g/dL (ref 1.5–4.5)
Glucose: 86 mg/dL (ref 65–99)
POTASSIUM: 4.1 mmol/L (ref 3.5–5.2)
Sodium: 143 mmol/L (ref 134–144)
Total Protein: 6.9 g/dL (ref 6.0–8.5)

## 2018-07-19 LAB — VITAMIN D 25 HYDROXY (VIT D DEFICIENCY, FRACTURES): VIT D 25 HYDROXY: 49 ng/mL (ref 30.0–100.0)

## 2018-07-19 LAB — TSH: TSH: 4.44 u[IU]/mL (ref 0.450–4.500)

## 2018-08-27 DIAGNOSIS — Z1211 Encounter for screening for malignant neoplasm of colon: Secondary | ICD-10-CM | POA: Diagnosis not present

## 2018-08-27 DIAGNOSIS — Z1212 Encounter for screening for malignant neoplasm of rectum: Secondary | ICD-10-CM | POA: Diagnosis not present

## 2018-08-28 LAB — COLOGUARD: Cologuard: NEGATIVE

## 2018-09-08 ENCOUNTER — Other Ambulatory Visit: Payer: Self-pay

## 2018-09-08 MED ORDER — ALLOPURINOL 300 MG PO TABS: 300.0000 mg | ORAL_TABLET | Freq: Every day | ORAL | 1 refills | Status: DC

## 2018-09-13 ENCOUNTER — Other Ambulatory Visit: Payer: Self-pay

## 2018-09-13 ENCOUNTER — Emergency Department (HOSPITAL_COMMUNITY)
Admission: EM | Admit: 2018-09-13 | Discharge: 2018-09-13 | Disposition: A | Discharge status: Home / Self Care | Payer: Medicare Other | Attending: Emergency Medicine | Admitting: Emergency Medicine

## 2018-09-13 ENCOUNTER — Encounter (HOSPITAL_COMMUNITY): Payer: Self-pay

## 2018-09-13 ENCOUNTER — Emergency Department (HOSPITAL_COMMUNITY): Payer: Medicare Other

## 2018-09-13 DIAGNOSIS — Z79899 Other long term (current) drug therapy: Secondary | ICD-10-CM | POA: Insufficient documentation

## 2018-09-13 DIAGNOSIS — R109 Unspecified abdominal pain: Secondary | ICD-10-CM | POA: Diagnosis not present

## 2018-09-13 DIAGNOSIS — R319 Hematuria, unspecified: Secondary | ICD-10-CM | POA: Diagnosis not present

## 2018-09-13 DIAGNOSIS — N281 Cyst of kidney, acquired: Secondary | ICD-10-CM

## 2018-09-13 DIAGNOSIS — R1084 Generalized abdominal pain: Secondary | ICD-10-CM | POA: Diagnosis present

## 2018-09-13 DIAGNOSIS — Z7982 Long term (current) use of aspirin: Secondary | ICD-10-CM | POA: Insufficient documentation

## 2018-09-13 DIAGNOSIS — Z87891 Personal history of nicotine dependence: Secondary | ICD-10-CM | POA: Diagnosis not present

## 2018-09-13 DIAGNOSIS — N200 Calculus of kidney: Secondary | ICD-10-CM | POA: Diagnosis not present

## 2018-09-13 DIAGNOSIS — I10 Essential (primary) hypertension: Secondary | ICD-10-CM | POA: Diagnosis not present

## 2018-09-13 DIAGNOSIS — E039 Hypothyroidism, unspecified: Secondary | ICD-10-CM | POA: Diagnosis not present

## 2018-09-13 DIAGNOSIS — I2510 Atherosclerotic heart disease of native coronary artery without angina pectoris: Secondary | ICD-10-CM | POA: Diagnosis not present

## 2018-09-13 LAB — CBC WITH DIFFERENTIAL/PLATELET
Abs Immature Granulocytes: 0.05 10*3/uL (ref 0.00–0.07)
Basophils Absolute: 0 10*3/uL (ref 0.0–0.1)
Basophils Relative: 0 %
EOS PCT: 1 %
Eosinophils Absolute: 0.2 10*3/uL (ref 0.0–0.5)
HCT: 44.5 % (ref 39.0–52.0)
Hemoglobin: 14.3 g/dL (ref 13.0–17.0)
Immature Granulocytes: 0 %
Lymphocytes Relative: 12 %
Lymphs Abs: 1.4 10*3/uL (ref 0.7–4.0)
MCH: 29.1 pg (ref 26.0–34.0)
MCHC: 32.1 g/dL (ref 30.0–36.0)
MCV: 90.6 fL (ref 80.0–100.0)
MONOS PCT: 12 %
Monocytes Absolute: 1.4 10*3/uL — ABNORMAL HIGH (ref 0.1–1.0)
Neutro Abs: 8.4 10*3/uL — ABNORMAL HIGH (ref 1.7–7.7)
Neutrophils Relative %: 75 %
PLATELETS: 156 10*3/uL (ref 150–400)
RBC: 4.91 MIL/uL (ref 4.22–5.81)
RDW: 14.3 % (ref 11.5–15.5)
WBC: 11.4 10*3/uL — ABNORMAL HIGH (ref 4.0–10.5)
nRBC: 0 % (ref 0.0–0.2)

## 2018-09-13 LAB — BASIC METABOLIC PANEL
Anion gap: 6 (ref 5–15)
BUN: 22 mg/dL (ref 8–23)
CO2: 25 mmol/L (ref 22–32)
CREATININE: 1.71 mg/dL — AB (ref 0.61–1.24)
Calcium: 8.8 mg/dL — ABNORMAL LOW (ref 8.9–10.3)
Chloride: 109 mmol/L (ref 98–111)
GFR calc Af Amer: 44 mL/min — ABNORMAL LOW (ref >60)
GFR calc non Af Amer: 38 mL/min — ABNORMAL LOW (ref >60)
Glucose, Bld: 123 mg/dL — ABNORMAL HIGH (ref 70–99)
Potassium: 3.7 mmol/L (ref 3.5–5.1)
SODIUM: 140 mmol/L (ref 135–145)

## 2018-09-13 LAB — URINALYSIS, ROUTINE W REFLEX MICROSCOPIC
Bilirubin Urine: NEGATIVE
Glucose, UA: NEGATIVE mg/dL
Hgb urine dipstick: NEGATIVE
Ketones, ur: NEGATIVE mg/dL
Leukocytes, UA: NEGATIVE
Nitrite: NEGATIVE
Protein, ur: NEGATIVE mg/dL
Specific Gravity, Urine: 1.017 (ref 1.005–1.030)
pH: 6 (ref 5.0–8.0)

## 2018-09-13 LAB — HEPATIC FUNCTION PANEL
ALT: 20 U/L (ref 0–44)
AST: 21 U/L (ref 15–41)
Albumin: 3.7 g/dL (ref 3.5–5.0)
Alkaline Phosphatase: 86 U/L (ref 38–126)
BILIRUBIN DIRECT: 0.2 mg/dL (ref 0.0–0.2)
Indirect Bilirubin: 1.2 mg/dL — ABNORMAL HIGH (ref 0.3–0.9)
Total Bilirubin: 1.4 mg/dL — ABNORMAL HIGH (ref 0.3–1.2)
Total Protein: 7.2 g/dL (ref 6.5–8.1)

## 2018-09-13 MED ORDER — HYDROCODONE-ACETAMINOPHEN 5-325 MG PO TABS
1.0000 | ORAL_TABLET | Freq: Once | ORAL | Status: AC
  Administered 2018-09-13: 1 via ORAL
  Filled 2018-09-13: qty 1

## 2018-09-13 MED ORDER — IOHEXOL 300 MG/ML  SOLN
75.0000 mL | Freq: Once | INTRAMUSCULAR | Status: AC | PRN
  Administered 2018-09-13: 75 mL via INTRAVENOUS

## 2018-09-13 MED ORDER — HYDROCODONE-ACETAMINOPHEN 5-325 MG PO TABS: 1.0000 | ORAL_TABLET | ORAL | 0 refills | Status: DC | PRN

## 2018-09-13 MED ORDER — CYCLOBENZAPRINE HCL 5 MG PO TABS: 5.0000 mg | ORAL_TABLET | Freq: Three times a day (TID) | ORAL | 0 refills | Status: DC | PRN

## 2018-09-13 MED ORDER — ONDANSETRON HCL 4 MG/2ML IJ SOLN
4.0000 mg | Freq: Once | INTRAMUSCULAR | Status: AC
  Administered 2018-09-13: 4 mg via INTRAVENOUS
  Filled 2018-09-13: qty 2

## 2018-09-13 MED ORDER — MORPHINE SULFATE (PF) 4 MG/ML IV SOLN
4.0000 mg | Freq: Once | INTRAVENOUS | Status: AC
  Administered 2018-09-13: 4 mg via INTRAVENOUS
  Filled 2018-09-13: qty 1

## 2018-09-13 NOTE — ED Provider Notes (Signed)
[facility] EMERGENCY DEPARTMENT Provider Note   CSN: [id number] Arrival date & time: 09/13/18  1013     History   Chief Complaint Chief Complaint  Patient presents with  . Flank Pain    HPI Kevin Flores is a 78 y.o. male with past medical history as outlined below, most significant for history of kidney stones and renal cysts, followed by Dr. Annabell Howells, CAD, CHF, gout, hypertension, GERD presenting with a 4-day history of right sided flank pain which is radiating into his right lower abdomen.  He also reports seeing blood in his urine several days ago which has since cleared.  He denies dysuria or increased urinary frequency and has had no fevers or chills.  He states the pain started when he was lifting a heavy object about 4 days ago, and endorses his pain is currently constant but over the past several days has been worsened by movement.  He initially thought his pain was musculoskeletal, but also endorses the last time he had a kidney stone he had similar pain with movement as well.  He has had no nausea or vomiting, denies reduced appetite.  He does endorse mild abdominal distention, denies diarrhea or constipation.  He has had no treatments for his symptoms prior to arrival.  The history is provided by the patient.    Past Medical History:  Diagnosis Date  . Allergy    seasonal  . Arthritis   . CAD (coronary artery disease)    1998 LIMA to the LAD, SVG to circumflex. DES placed to the PDA 11/2009   . Gout   . Heartburn   . Hyperlipidemia   . Hypertension   . Nephrolithiasis    left  . Personal history of colonic polyps    adenomas  . Thyroid disease     Patient Active Problem List   Diagnosis Date Noted  . Other dysphagia 12/31/2017  . CHF (congestive heart failure) (HCC) 11/23/2017  . Plantar fasciitis 04/09/2017  . Achilles tendinitis of left lower extremity 04/09/2017  . Chronic gout of right ankle 02/12/2017  . Overweight (BMI 25.0-29.9) 05/21/2016  .  Gastroesophageal reflux disease 10/17/2015  . Metabolic syndrome 10/17/2015  . Joint swelling 04/25/2015  . Vitamin D deficiency 04/06/2015  . Hypothyroidism 04/06/2015  . PALPITATIONS 12/21/2009  . Hyperlipidemia 12/04/2008  . Essential hypertension 12/04/2008  . Coronary atherosclerosis 12/04/2008  . SINUS BRADYCARDIA 12/04/2008    Past Surgical History:  Procedure Laterality Date  . BIOPSY PROSTATE    . cardiac stents  2001   and 56389  . COLONOSCOPY W/ POLYPECTOMY  2009   3 small adenomas  . CORONARY ARTERY BYPASS GRAFT  1998   2 bypass  . cysto with calculus manipulation    . UPPER GASTROINTESTINAL ENDOSCOPY          Home Medications    Prior to Admission medications   Medication Sig Start Date End Date Taking? Authorizing Provider  albuterol (PROVENTIL HFA;VENTOLIN HFA) 108 (90 BASE) MCG/ACT inhaler Inhale 2 puffs into the lungs every 6 (six) hours as needed for wheezing or shortness of breath. 08/19/13  Yes Haliburton, Mae E, FNP  allopurinol (ZYLOPRIM) 300 MG tablet Take 1 tablet (300 mg total) by mouth daily. 09/08/18  Yes Hawks, Christy A, FNP  amLODipine (NORVASC) 10 MG tablet Take 1 tablet (10 mg total) by mouth daily. 11/25/17  Yes Berton Bon, NP  aspirin 81 MG chewable tablet Chew 1 tablet (81 mg total) by mouth daily. 11/25/17  Yes  Berton BonHammond, Janine, NP  atorvastatin (LIPITOR) 20 MG tablet Take 20 mg by mouth daily at 6 PM.  01/23/18  Yes [provider]  Cholecalciferol (VITAMIN D-3 PO) Take 1 capsule by mouth daily.   Yes [provider]  fish oil-omega-3 fatty acids 1000 MG capsule Take 1 g by mouth at bedtime.    Yes [provider]  fluticasone (FLONASE) 50 MCG/ACT nasal spray Place 2 sprays into both nostrils daily. 12/03/17  Yes Elenora GammaBradshaw, Samuel L, MD  furosemide (LASIX) 20 MG tablet Take 1 tablet (20 mg total) by mouth every other day. 07/18/18  Yes Hawks, Christy A, FNP  levothyroxine (SYNTHROID, LEVOTHROID) 50 MCG tablet TAKE 1  TABLET BY MOUTH IN THE MORNING 07/18/18  Yes Hawks, Christy A, FNP  metoprolol succinate (TOPROL XL) 25 MG 24 hr tablet Take 1 tablet (25 mg total) by mouth daily. 07/18/18 07/18/19 Yes Hawks, Christy A, FNP  naproxen (NAPROSYN) 500 MG tablet Take 1 tablet (500 mg total) by mouth 2 (two) times daily as needed (for gout flares/pain). 06/12/18  Yes Hawks, Christy A, FNP  pantoprazole (PROTONIX) 40 MG tablet Take 1 tablet (40 mg total) by mouth daily. 07/18/18  Yes Hawks, Christy A, FNP  vitamin C (ASCORBIC ACID) 500 MG tablet Take 500 mg by mouth daily.   Yes [provider]  cyclobenzaprine (FLEXERIL) 5 MG tablet Take 1 tablet (5 mg total) by mouth 3 (three) times daily as needed for muscle spasms. 09/13/18   Burgess AmorIdol, Watson Robarge, PA-C  HYDROcodone-acetaminophen (NORCO/VICODIN) 5-325 MG tablet Take 1 tablet by mouth every 4 (four) hours as needed. 09/13/18   Burgess AmorIdol, Anyia Gierke, PA-C    Family History Family History  Problem Relation Age of Onset  . Colon cancer Brother   . Diabetes Brother   . Diabetes Father     Social History Social History   Tobacco Use  . Smoking status: Former Smoker    Last attempt to quit: 12/19/1971    Years since quitting: 46.7  . Smokeless tobacco: Never Used  Substance Use Topics  . Alcohol use: No  . Drug use: No     Allergies   Imdur [isosorbide dinitrate]; Rosuvastatin; and Sulfonamide derivatives   Review of Systems Review of Systems  Constitutional: Negative for chills and fever.  HENT: Negative for congestion and sore throat.   Eyes: Negative.   Respiratory: Negative for chest tightness and shortness of breath.   Cardiovascular: Negative for chest pain.  Gastrointestinal: Positive for abdominal distention and abdominal pain. Negative for blood in stool, constipation, diarrhea, nausea and vomiting.  Genitourinary: Positive for flank pain and hematuria.  Musculoskeletal: Positive for arthralgias and back pain. Negative for joint swelling and neck pain.    Skin: Negative.  Negative for rash and wound.  Neurological: Negative for dizziness, weakness, light-headedness, numbness and headaches.  Psychiatric/Behavioral: Negative.      Physical Exam Updated Vital Signs BP (!) 151/81 (BP Location: Left Arm)   Pulse 84   Temp 97.7 F (36.5 C) (Oral)   Resp 20   Ht 5\' 9"  (1.753 m)   Wt 92.5 kg   SpO2 99%   BMI 30.13 kg/m   Physical Exam Vitals signs and nursing note reviewed.  Constitutional:      Appearance: He is well-developed.  HENT:     Head: Normocephalic and atraumatic.  Eyes:     Conjunctiva/sclera: Conjunctivae normal.  Neck:     Musculoskeletal: Normal range of motion.  Cardiovascular:     Rate  and Rhythm: Normal rate and regular rhythm.     Heart sounds: Normal heart sounds.  Pulmonary:     Effort: Pulmonary effort is normal.     Breath sounds: Normal breath sounds. No wheezing.  Abdominal:     General: Bowel sounds are normal. There is distension.     Palpations: Abdomen is soft. There is no fluid wave.     Tenderness: There is abdominal tenderness in the right upper quadrant and right lower quadrant. There is right CVA tenderness.  Musculoskeletal: Normal range of motion.  Skin:    General: Skin is warm and dry.  Neurological:     Mental Status: He is alert.      ED Treatments / Results  Labs (all labs ordered are listed, but only abnormal results are displayed) Labs Reviewed  CBC WITH DIFFERENTIAL/PLATELET - Abnormal; Notable for the following components:      Result Value   WBC 11.4 (*)    Neutro Abs 8.4 (*)    Monocytes Absolute 1.4 (*)    All other components within normal limits  BASIC METABOLIC PANEL - Abnormal; Notable for the following components:   Glucose, Bld 123 (*)    Creatinine, Ser 1.71 (*)    Calcium 8.8 (*)    GFR calc non Af Amer 38 (*)    GFR calc Af Amer 44 (*)    All other components within normal limits  HEPATIC FUNCTION PANEL - Abnormal; Notable for the following components:    Total Bilirubin 1.4 (*)    Indirect Bilirubin 1.2 (*)    All other components within normal limits  URINALYSIS, ROUTINE W REFLEX MICROSCOPIC    EKG None  Radiology Ct Abdomen Pelvis W Contrast  Result Date: 09/13/2018 CLINICAL DATA:  Right-sided abdominal pain for 3 days EXAM: CT ABDOMEN AND PELVIS WITH CONTRAST TECHNIQUE: Multidetector CT imaging of the abdomen and pelvis was performed using the standard protocol following bolus administration of intravenous contrast. CONTRAST:  75mL OMNIPAQUE IOHEXOL 300 MG/ML  SOLN COMPARISON:  None. FINDINGS: Lower chest: Dependent atelectasis Hepatobiliary: Unremarkable Pancreas: Unremarkable Spleen: Unremarkable Adrenals/Urinary Tract: Several cysts are again seen throughout both kidneys. There is a punctate calculus in the right kidney on image 34. Punctate left lower pole calculus on image 41. No hydronephrosis. Bladder is unremarkable. Adrenal glands are within normal limits. Stomach/Bowel: No obvious mass in the colon. No evidence of small-bowel obstruction. Vascular/Lymphatic: Atherosclerotic vascular calcifications are seen throughout the aorta and iliac arteries. No abnormal retroperitoneal adenopathy. Reproductive: Prostate is within normal limits. Other: No free fluid. Musculoskeletal: No vertebral compression deformity IMPRESSION: Bilateral nephrolithiasis. Bilateral renal cysts are again noted. No acute intra-abdominal pathology. Electronically Signed   By: Jolaine ClickArthur  Hoss M.D.   On: 09/13/2018 11:40    Procedures Procedures (including critical care time)  Medications Ordered in ED Medications  HYDROcodone-acetaminophen (NORCO/VICODIN) 5-325 MG per tablet 1 tablet (has no administration in time range)  morphine 4 MG/ML injection 4 mg (4 mg Intravenous Given 09/13/18 1110)  ondansetron (ZOFRAN) injection 4 mg (4 mg Intravenous Given 09/13/18 1110)  iohexol (OMNIPAQUE) 300 MG/ML solution 75 mL (75 mLs Intravenous Contrast Given 09/13/18 1118)      Initial Impression / Assessment and Plan / ED Course  I have reviewed the triage vital signs and the nursing notes.  Pertinent labs & imaging results that were available during my care of the patient were reviewed by me and considered in my medical decision making (see chart for details).  Pt with pain suggestive of musculoskeletal source given it is worsened with movement and started after lifting a heavy object.  CT imaging revealing no ureteral stones, but with sig finding of multiple renal cysts which he has had aspirated in the past per chart (Dr. Annabell Howells).  Will prescribe pain medicine for home use, but advised f/u with urology for further eval of the renal cysts.  Discussed that he does have renal stones present today, but not source of pain.    Pt was seen by Dr Ranae Palms during todays visit.  Final Clinical Impressions(s) / ED Diagnoses   Final diagnoses:  Flank pain  Bilateral renal cysts  Renal stones    ED Discharge Orders         Ordered    HYDROcodone-acetaminophen (NORCO/VICODIN) 5-325 MG tablet  Every 4 hours PRN     09/13/18 1236    cyclobenzaprine (FLEXERIL) 5 MG tablet  3 times daily PRN     09/13/18 1236           Burgess Amor, PA-C 09/13/18 1240    Loren Racer, MD 09/13/18 1434

## 2018-09-13 NOTE — ED Triage Notes (Signed)
Pt reports pain in r flank x 3 days.  Reports history of kidney stones.  Denies n/v/d.

## 2018-09-13 NOTE — Discharge Instructions (Addendum)
We suspect your pain is related to the muscles in your back since you pain worsens with movement as discussed.  You have been prescribed several medicines to help with pain and muscle spasm.  Do not drive within 4 hours of taking hydrocodone as this can make you drowsy (flexeril can also make you slightly drowsy).  Apply a heating pad for 20 minutes 3-4 times daily to the area of pain.

## 2018-10-05 NOTE — Progress Notes (Deleted)
Cardiology Office Note   Date:  10/05/2018   ID:  Kevin Flores Dejoy, DOB 04/12/1941, MRN [id number]  PCP:  [name], Christy A, FNP  Cardiologist:   Rollene RotundaJames Keiley Levey, MD   No chief complaint on file.     History of Present Illness: Kevin Flores [place] is a 78 y.o. male who presents follow up of CAD.   He has a history of CAD s/p CABG 1998, DES RCA 2001, DES PDA 2011 (LIMA-LAD & SVG-OM ok).   He was admitted 3/15-3/18/2019 for bradycardia and CHF exacerbation, weight at discharge 198 pounds.  His reatinine was 2.05 at discharge and  1.68 at recheck 3/22.  Myoview was intermediate risk but because of high risk of AKI, he was managed medically.   He returns for follow up.  ***  When he came back to see us in follow-up he was having some symptoms consistent with heartburn but this improved with Prilosec.  He is been working 3 days/week.  He is on his feet quite a bit and doing a lot of walking working at an [employer] in [place].  He says he does well with this.  He does some golfing.  He is having none of the shortness of breath that he presented with earlier this year.  He is not describing any PND or orthopnea.  Is not having any palpitations, presyncope or syncope.  He is not bothered by his PVCs.  His leg swelling is minimal.  He watches his weight and his salt.  Past Medical History:  Diagnosis Date  . Allergy    seasonal  . Arthritis   . CAD (coronary artery disease)    1998 LIMA to the LAD, SVG to circumflex. DES placed to the PDA 11/2009   . Gout   . Heartburn   . Hyperlipidemia   . Hypertension   . Nephrolithiasis    left  . Personal history of colonic polyps    adenomas  . Thyroid disease     Past Surgical History:  Procedure Laterality Date  . BIOPSY PROSTATE    . cardiac stents  2001   and [id number]  . COLONOSCOPY W/ POLYPECTOMY  2009   3 small adenomas  . CORONARY ARTERY BYPASS GRAFT  1998   2 bypass  . cysto with calculus manipulation    . UPPER GASTROINTESTINAL ENDOSCOPY        Current Outpatient Medications  Medication Sig Dispense Refill  . albuterol (PROVENTIL HFA;VENTOLIN HFA) 108 (90 BASE) MCG/ACT inhaler Inhale 2 puffs into the lungs every 6 (six) hours as needed for wheezing or shortness of breath. 1 Inhaler 3  . allopurinol (ZYLOPRIM) 300 MG tablet Take 1 tablet (300 mg total) by mouth daily. 90 tablet 1  . amLODipine (NORVASC) 10 MG tablet Take 1 tablet (10 mg total) by mouth daily. 90 tablet 3  . aspirin 81 MG chewable tablet Chew 1 tablet (81 mg total) by mouth daily. 90 tablet 3  . atorvastatin (LIPITOR) 20 MG tablet Take 20 mg by mouth daily at 6 PM.     . Cholecalciferol (VITAMIN D-3 PO) Take 1 capsule by mouth daily.    . cyclobenzaprine (FLEXERIL) 5 MG tablet Take 1 tablet (5 mg total) by mouth 3 (three) times daily as needed for muscle spasms. 15 tablet 0  . fish oil-omega-3 fatty acids 1000 MG capsule Take 1 g by mouth at bedtime.     . fluticasone (FLONASE) 50 MCG/ACT nasal spray Place 2 sprays into both nostrils  daily. 16 g 6  . furosemide (LASIX) 20 MG tablet Take 1 tablet (20 mg total) by mouth every other day. 90 tablet 1  . HYDROcodone-acetaminophen (NORCO/VICODIN) 5-325 MG tablet Take 1 tablet by mouth every 4 (four) hours as needed. 20 tablet 0  . levothyroxine (SYNTHROID, LEVOTHROID) 50 MCG tablet TAKE 1 TABLET BY MOUTH IN THE MORNING 90 tablet 2  . metoprolol succinate (TOPROL XL) 25 MG 24 hr tablet Take 1 tablet (25 mg total) by mouth daily. 90 tablet 4  . naproxen (NAPROSYN) 500 MG tablet Take 1 tablet (500 mg total) by mouth 2 (two) times daily as needed (for gout flares/pain). 60 tablet 1  . pantoprazole (PROTONIX) 40 MG tablet Take 1 tablet (40 mg total) by mouth daily. 90 tablet 2  . vitamin C (ASCORBIC ACID) 500 MG tablet Take 500 mg by mouth daily.     No current facility-administered medications for this visit.     Allergies:   Imdur [isosorbide dinitrate]; Rosuvastatin; and Sulfonamide derivatives    ROS:  Please see  the history of present illness.   Otherwise, review of systems are positive for ***.   All other systems are reviewed and negative.    PHYSICAL EXAM: VS:  There were no vitals taken for this visit. , BMI There is no height or weight on file to calculate BMI.  GENERAL:  Well appearing NECK:  No jugular venous distention, waveform within normal limits, carotid upstroke brisk and symmetric, no bruits, no thyromegaly LUNGS:  Clear to auscultation bilaterally CHEST:  Well healed sternotomy scar. HEART:  PMI not displaced or sustained,S1 and S2 within normal limits, no S3, no S4, no clicks, no rubs, *** murmurs ABD:  Flat, positive bowel sounds normal in frequency in pitch, no bruits, no rebound, no guarding, no midline pulsatile mass, no hepatomegaly, no splenomegaly EXT:  2 plus pulses throughout, no edema, no cyanosis no clubbing     ***GENERAL:  Well appearing NECK:  No jugular venous distention, waveform within normal limits, carotid upstroke brisk and symmetric, no bruits, no thyromegaly LUNGS:  Clear to auscultation bilaterally CHEST:  Well healed sternotomy scar. HEART:  PMI not displaced or sustained,S1 and S2 within normal limits, no S3, no S4, no clicks, no rubs, 2 out of 6 apical systolic murmur brief, no diastolic murmurs ABD:  Flat, positive bowel sounds normal in frequency in pitch, no bruits, no rebound, no guarding, no midline pulsatile mass, no hepatomegaly, no splenomegaly EXT:  2 plus pulses throughout, trace edema, no cyanosis no clubbing    EKG:  EKG is not *** ordered today. ***   Recent Labs: 11/22/2017: B Natriuretic Peptide 564.4 11/25/2017: Magnesium 2.0 07/18/2018: TSH 4.440 09/13/2018: ALT 20; BUN 22; Creatinine, Ser 1.71; Hemoglobin 14.3; Platelets 156; Potassium 3.7; Sodium 140    Lipid Panel    Component Value Date/Time   CHOL 120 07/18/2018 1235   TRIG 130 07/18/2018 1235   HDL 33 (L) 07/18/2018 1235   CHOLHDL 3.6 07/18/2018 1235   CHOLHDL 4.7  11/18/2009 0347   VLDL 27 11/18/2009 0347   LDLCALC 61 07/18/2018 1235   LDLDIRECT 77 09/12/2017 1345      Wt Readings from Last 3 Encounters:  09/13/18 204 lb (92.5 kg)  07/18/18 206 lb (93.4 kg)  06/12/18 206 lb 3.2 oz (93.5 kg)      Other studies Reviewed: Additional studies/ records that were reviewed today include: ***. Review of the above records demonstrates:  Please see elsewhere in the  note.     ASSESSMENT AND PLAN:  CHRONIC DIASTOLIC HF:   ***  He seems to be euvolemic.  At this point no change in therapy is indicated.  CAD:   ***   The patient has no new sypmtoms.  No further cardiovascular testing is indicated.  We will continue with aggressive risk reduction and meds as listed.  HTN:  The blood pressure is ***  at target. No change in medications is indicated. We will continue with therapeutic lifestyle changes (TLC).  PVCs:   ***  He continues to have PVCs but is not feeling this.  No change in therapy is planned.   Current medicines are reviewed at length with the patient today.  The patient does not have concerns regarding medicines.  The following changes have been made:  ***  Labs/ tests ordered today include:  ***  No orders of the defined types were placed in this encounter.    Disposition:   FU with me in *** months.     Signed, Rollene RotundaJames Kriss Perleberg, MD  10/05/2018 8:35 PM    [facility]

## 2018-10-07 ENCOUNTER — Other Ambulatory Visit: Payer: Self-pay

## 2018-10-07 ENCOUNTER — Encounter (HOSPITAL_COMMUNITY): Payer: Self-pay | Admitting: Emergency Medicine

## 2018-10-07 ENCOUNTER — Emergency Department (HOSPITAL_COMMUNITY)
Admission: EM | Admit: 2018-10-07 | Discharge: 2018-10-07 | Disposition: A | Discharge status: Home / Self Care | Payer: Medicare Other | Attending: Emergency Medicine | Admitting: Emergency Medicine

## 2018-10-07 ENCOUNTER — Emergency Department (HOSPITAL_COMMUNITY): Payer: Medicare Other

## 2018-10-07 DIAGNOSIS — R109 Unspecified abdominal pain: Secondary | ICD-10-CM | POA: Insufficient documentation

## 2018-10-07 DIAGNOSIS — I2510 Atherosclerotic heart disease of native coronary artery without angina pectoris: Secondary | ICD-10-CM | POA: Diagnosis not present

## 2018-10-07 DIAGNOSIS — Z87891 Personal history of nicotine dependence: Secondary | ICD-10-CM | POA: Insufficient documentation

## 2018-10-07 DIAGNOSIS — I10 Essential (primary) hypertension: Secondary | ICD-10-CM | POA: Diagnosis not present

## 2018-10-07 DIAGNOSIS — R1031 Right lower quadrant pain: Secondary | ICD-10-CM | POA: Diagnosis not present

## 2018-10-07 DIAGNOSIS — N200 Calculus of kidney: Secondary | ICD-10-CM | POA: Diagnosis not present

## 2018-10-07 DIAGNOSIS — E785 Hyperlipidemia, unspecified: Secondary | ICD-10-CM | POA: Diagnosis not present

## 2018-10-07 LAB — CBC WITH DIFFERENTIAL/PLATELET
ABS IMMATURE GRANULOCYTES: 0.02 10*3/uL (ref 0.00–0.07)
Basophils Absolute: 0 10*3/uL (ref 0.0–0.1)
Basophils Relative: 0 %
Eosinophils Absolute: 0.1 10*3/uL (ref 0.0–0.5)
Eosinophils Relative: 1 %
HCT: 47.8 % (ref 39.0–52.0)
HEMOGLOBIN: 15.1 g/dL (ref 13.0–17.0)
Immature Granulocytes: 0 %
LYMPHS PCT: 17 %
Lymphs Abs: 1.6 10*3/uL (ref 0.7–4.0)
MCH: 28.7 pg (ref 26.0–34.0)
MCHC: 31.6 g/dL (ref 30.0–36.0)
MCV: 90.9 fL (ref 80.0–100.0)
Monocytes Absolute: 1.4 10*3/uL — ABNORMAL HIGH (ref 0.1–1.0)
Monocytes Relative: 15 %
Neutro Abs: 6.4 10*3/uL (ref 1.7–7.7)
Neutrophils Relative %: 67 %
Platelets: 151 10*3/uL (ref 150–400)
RBC: 5.26 MIL/uL (ref 4.22–5.81)
RDW: 14.4 % (ref 11.5–15.5)
WBC: 9.6 10*3/uL (ref 4.0–10.5)
nRBC: 0 % (ref 0.0–0.2)

## 2018-10-07 LAB — BASIC METABOLIC PANEL
Anion gap: 7 (ref 5–15)
BUN: 19 mg/dL (ref 8–23)
CHLORIDE: 106 mmol/L (ref 98–111)
CO2: 25 mmol/L (ref 22–32)
Calcium: 9.3 mg/dL (ref 8.9–10.3)
Creatinine, Ser: 1.4 mg/dL — ABNORMAL HIGH (ref 0.61–1.24)
GFR calc Af Amer: 57 mL/min — ABNORMAL LOW (ref >60)
GFR calc non Af Amer: 49 mL/min — ABNORMAL LOW (ref >60)
Glucose, Bld: 108 mg/dL — ABNORMAL HIGH (ref 70–99)
Potassium: 3.6 mmol/L (ref 3.5–5.1)
Sodium: 138 mmol/L (ref 135–145)

## 2018-10-07 LAB — URINALYSIS, ROUTINE W REFLEX MICROSCOPIC
Bilirubin Urine: NEGATIVE
Glucose, UA: NEGATIVE mg/dL
Hgb urine dipstick: NEGATIVE
Ketones, ur: NEGATIVE mg/dL
Leukocytes, UA: NEGATIVE
Nitrite: NEGATIVE
Protein, ur: NEGATIVE mg/dL
SPECIFIC GRAVITY, URINE: 1.015 (ref 1.005–1.030)
pH: 6 (ref 5.0–8.0)

## 2018-10-07 MED ORDER — FENTANYL CITRATE (PF) 100 MCG/2ML IJ SOLN
50.0000 ug | INTRAMUSCULAR | Status: DC | PRN
  Administered 2018-10-07: 50 ug via INTRAVENOUS
  Filled 2018-10-07: qty 2

## 2018-10-07 NOTE — ED Notes (Signed)
Pt given a urinal and made aware a urine sample is needed when possible.  

## 2018-10-07 NOTE — Discharge Instructions (Addendum)
If you were given medicines take as directed.  If you are on coumadin or contraceptives realize their levels and effectiveness is altered by many different medicines.  If you have any reaction (rash, tongues swelling, other) to the medicines stop taking and see a physician.    If your blood pressure was elevated in the ER make sure you follow up for management with a primary doctor or return for chest pain, shortness of breath or stroke symptoms.  Please follow up as directed and return to the ER or see a physician for new or worsening symptoms.  Thank you. Vitals:   10/07/18 0924 10/07/18 0925  BP:  (!) 155/88  Pulse:  83  Resp:  18  Temp:  98 F (36.7 C)  TempSrc:  Oral  SpO2:  98%  Weight: 92 kg   Height: 5\' 9"  (1.753 m)

## 2018-10-07 NOTE — ED Provider Notes (Signed)
[facility] EMERGENCY DEPARTMENT Provider Note   CSN: [id number] Arrival date & time: 10/07/18  0913     History   Chief Complaint Chief Complaint  Patient presents with  . Back Pain    HPI Kevin Flores is a 78 y.o. male.  Patient with coronary artery disease, high blood pressure, kidney stones presents with right lower abdominal and flank pain worsening for the past 2 days.  Patient's had this once in the past and was told musculoskeletal and another time he has had a kidney stone.  More sudden onset.  No fevers chills or vomiting.  No injuries.  Fairly constant no urinary symptoms     Past Medical History:  Diagnosis Date  . Allergy    seasonal  . Arthritis   . CAD (coronary artery disease)    1998 LIMA to the LAD, SVG to circumflex. DES placed to the PDA 11/2009   . Gout   . Heartburn   . Hyperlipidemia   . Hypertension   . Nephrolithiasis    left  . Personal history of colonic polyps    adenomas  . Thyroid disease     Patient Active Problem List   Diagnosis Date Noted  . Other dysphagia 12/31/2017  . CHF (congestive heart failure) (HCC) 11/23/2017  . Plantar fasciitis 04/09/2017  . Achilles tendinitis of left lower extremity 04/09/2017  . Chronic gout of right ankle 02/12/2017  . Overweight (BMI 25.0-29.9) 05/21/2016  . Gastroesophageal reflux disease 10/17/2015  . Metabolic syndrome 10/17/2015  . Joint swelling 04/25/2015  . Vitamin D deficiency 04/06/2015  . Hypothyroidism 04/06/2015  . PALPITATIONS 12/21/2009  . Hyperlipidemia 12/04/2008  . Essential hypertension 12/04/2008  . Coronary atherosclerosis 12/04/2008  . SINUS BRADYCARDIA 12/04/2008    Past Surgical History:  Procedure Laterality Date  . BIOPSY PROSTATE    . cardiac stents  2001   and [id number]  . COLONOSCOPY W/ POLYPECTOMY  2009   3 small adenomas  . CORONARY ARTERY BYPASS GRAFT  1998   2 bypass  . cysto with calculus manipulation    . UPPER GASTROINTESTINAL ENDOSCOPY           Home Medications    Prior to Admission medications   Medication Sig Start Date End Date Taking? Authorizing Provider  allopurinol (ZYLOPRIM) 300 MG tablet Take 1 tablet (300 mg total) by mouth daily. 09/08/18  Yes Hawks, Christy A, FNP  amLODipine (NORVASC) 10 MG tablet Take 1 tablet (10 mg total) by mouth daily. 11/25/17  Yes Berton Bon, NP  aspirin 81 MG chewable tablet Chew 1 tablet (81 mg total) by mouth daily. 11/25/17  Yes Berton Bon, NP  atorvastatin (LIPITOR) 20 MG tablet Take 20 mg by mouth daily at 6 PM.  01/23/18  Yes [provider]  Cholecalciferol (VITAMIN D-3 PO) Take 1 capsule by mouth daily.   Yes [provider]  cyclobenzaprine (FLEXERIL) 5 MG tablet Take 1 tablet (5 mg total) by mouth 3 (three) times daily as needed for muscle spasms. Patient taking differently: Take 5 mg by mouth daily as needed for muscle spasms.  09/13/18  Yes Idol, Raynelle Fanning, PA-C  fish oil-omega-3 fatty acids 1000 MG capsule Take 1 g by mouth at bedtime.    Yes [provider]  furosemide (LASIX) 20 MG tablet Take 1 tablet (20 mg total) by mouth every other day. 07/18/18  Yes Hawks, Christy A, FNP  HYDROcodone-acetaminophen (NORCO/VICODIN) 5-325 MG tablet Take 1 tablet by mouth every 4 (four) hours as  needed. Patient taking differently: Take 1 tablet by mouth every 4 (four) hours as needed for severe pain.  09/13/18  Yes Idol, Raynelle FanningJulie, PA-C  levothyroxine (SYNTHROID, LEVOTHROID) 50 MCG tablet TAKE 1 TABLET BY MOUTH IN THE MORNING Patient taking differently: Take 50 mcg by mouth daily before breakfast. TAKE 1 TABLET BY MOUTH IN THE MORNING 07/18/18  Yes Hawks, Christy A, FNP  metoprolol succinate (TOPROL XL) 25 MG 24 hr tablet Take 1 tablet (25 mg total) by mouth daily. 07/18/18 07/18/19 Yes Hawks, Christy A, FNP  naproxen (NAPROSYN) 500 MG tablet Take 1 tablet (500 mg total) by mouth 2 (two) times daily as needed (for gout flares/pain). Patient taking differently: Take 500  mg by mouth daily as needed (for gout flares/pain).  06/12/18  Yes Hawks, Christy A, FNP  pantoprazole (PROTONIX) 40 MG tablet Take 1 tablet (40 mg total) by mouth daily. 07/18/18  Yes Hawks, Christy A, FNP  vitamin C (ASCORBIC ACID) 500 MG tablet Take 500 mg by mouth daily.   Yes [provider]  albuterol (PROVENTIL HFA;VENTOLIN HFA) 108 (90 BASE) MCG/ACT inhaler Inhale 2 puffs into the lungs every 6 (six) hours as needed for wheezing or shortness of breath. Patient not taking: Reported on 10/07/2018 08/19/13   Coralie KeensHaliburton, Mae E, FNP  fluticasone [facility](FLONASE) 50 MCG/ACT nasal spray Place 2 sprays into both nostrils daily. Patient not taking: Reported on 10/07/2018 12/03/17   Elenora GammaBradshaw, Samuel L, MD    Family History Family History  Problem Relation Age of Onset  . Colon cancer Brother   . Diabetes Brother   . Diabetes Father     Social History Social History   Tobacco Use  . Smoking status: Former Smoker    Last attempt to quit: 12/19/1971    Years since quitting: 46.8  . Smokeless tobacco: Never Used  Substance Use Topics  . Alcohol use: No  . Drug use: No     Allergies   Imdur [isosorbide dinitrate]; Rosuvastatin; and Sulfonamide derivatives   Review of Systems Review of Systems  Constitutional: Negative for chills and fever.  HENT: Negative for congestion.   Eyes: Negative for visual disturbance.  Respiratory: Negative for shortness of breath.   Cardiovascular: Negative for chest pain.  Gastrointestinal: Positive for abdominal pain. Negative for vomiting.  Genitourinary: Positive for flank pain. Negative for dysuria.  Musculoskeletal: Negative for back pain, neck pain and neck stiffness.  Skin: Negative for rash.  Neurological: Negative for light-headedness and headaches.     Physical Exam Updated Vital Signs BP (!) 155/88 (BP Location: Left Arm)   Pulse 83   Temp 98 F (36.7 C) (Oral)   Resp 18   Ht 5\' 9"  (1.753 m)   Wt 92 kg   SpO2 98%   BMI 29.95  kg/m   Physical Exam Vitals signs and nursing note reviewed.  Constitutional:      Appearance: He is well-developed.  HENT:     Head: Normocephalic and atraumatic.  Eyes:     General:        Right eye: No discharge.        Left eye: No discharge.     Conjunctiva/sclera: Conjunctivae normal.  Neck:     Musculoskeletal: Normal range of motion.     Trachea: No tracheal deviation.  Cardiovascular:     Rate and Rhythm: Normal rate.  Pulmonary:     Effort: Pulmonary effort is normal.  Abdominal:     General: There is no distension.  Palpations: Abdomen is soft.     Tenderness: There is abdominal tenderness (RUQ, right lower flank). There is no guarding.  Skin:    General: Skin is warm.     Findings: No rash.  Neurological:     Mental Status: He is alert and oriented to person, place, and time.      ED Treatments / Results  Labs (all labs ordered are listed, but only abnormal results are displayed) Labs Reviewed  CBC WITH DIFFERENTIAL/PLATELET - Abnormal; Notable for the following components:      Result Value   Monocytes Absolute 1.4 (*)    All other components within normal limits  BASIC METABOLIC PANEL - Abnormal; Notable for the following components:   Glucose, Bld 108 (*)    Creatinine, Ser 1.40 (*)    GFR calc non Af Amer 49 (*)    GFR calc Af Amer 57 (*)    All other components within normal limits  URINALYSIS, ROUTINE W REFLEX MICROSCOPIC    EKG None  Radiology No results found.  Procedures Procedures (including critical care time)  Medications Ordered in ED Medications  fentaNYL (SUBLIMAZE) injection 50 mcg (50 mcg Intravenous Given 10/07/18 1006)     Initial Impression / Assessment and Plan / ED Course  I have reviewed the triage vital signs and the nursing notes.  Pertinent labs & imaging results that were available during my care of the patient were reviewed by me and considered in my medical decision making (see chart for details).     Well-appearing patient presents with worsening right lower flank pain.  Discussed differential of kidney stone versus musculoskeletal versus atypical appendicitis versus other.  Plan for CT scan for further delineation, blood work pending, urinalysis reviewed unremarkable.  Patient has no pain on reassessment.  CT scan no acute findings.  Likely musculoskeletal.  Patient has muscle relaxant at home discussed Tylenol as needed and primary care follow-up.  Blood work reassuring urinalysis no sign of infection or bleeding.  Final Clinical Impressions(s) / ED Diagnoses   Final diagnoses:  Acute right flank pain    ED Discharge Orders    None       Blane Ohara, MD 10/07/18 1244

## 2018-10-07 NOTE — ED Triage Notes (Signed)
Pain to rt lower back with movement.  Was seen on 01/04 for same problem.  Pt states he was feeling better and now it has flared up again.  Denies any injury

## 2018-10-07 NOTE — ED Notes (Signed)
Call to [facility] pharmacy in [place] ordered Flexiril 5mg  q8hr #10 with no refills to [facility].   Family notified by telephone.

## 2018-10-07 NOTE — ED Notes (Addendum)
Pt back from CT

## 2018-10-08 ENCOUNTER — Ambulatory Visit: Payer: Medicare Other | Admitting: Cardiology

## 2018-10-09 ENCOUNTER — Ambulatory Visit (INDEPENDENT_AMBULATORY_CARE_PROVIDER_SITE_OTHER): Payer: Medicare Other | Admitting: Family Medicine

## 2018-10-09 ENCOUNTER — Encounter: Payer: Self-pay | Admitting: Family Medicine

## 2018-10-09 VITALS — BP 145/78 | HR 76 | Temp 97.1°F | Ht 69.0 in | Wt 213.0 lb

## 2018-10-09 DIAGNOSIS — M545 Low back pain, unspecified: Secondary | ICD-10-CM

## 2018-10-09 MED ORDER — METHYLPREDNISOLONE ACETATE 80 MG/ML IJ SUSP
80.0000 mg | Freq: Once | INTRAMUSCULAR | Status: AC
  Administered 2018-10-09: 80 mg via INTRAMUSCULAR

## 2018-10-09 MED ORDER — CYCLOBENZAPRINE HCL 5 MG PO TABS: 5.0000 mg | ORAL_TABLET | Freq: Three times a day (TID) | ORAL | 0 refills | Status: DC | PRN

## 2018-10-09 NOTE — Progress Notes (Signed)
    Subjective:    Kevin Flores is a 78 y.o. male who presents for follow up of low back problems. Current symptoms include: pain in mid to right lower back (aching, dull and sharp in character; 6/10 in severity) and stiffness in right lower back. Symptoms have improved slightly from the previous visit. Exacerbating factors identified by the patient are bending backwards, bending forwards, bending sideways and palpation.  The following portions of the patient's history were reviewed and updated as appropriate: allergies, current medications, past family history, past medical history, past social history, past surgical history and problem list.    Objective:    BP (!) 145/78   Pulse 76   Temp (!) 97.1 F (36.2 C) (Oral)   Ht 5\' 9"  (1.753 m)   Wt 213 lb (96.6 kg)   BMI 31.45 kg/m  General appearance: alert, cooperative and no distress Head: Normocephalic, without obvious abnormality, atraumatic Neck: no adenopathy, no carotid bruit, no JVD, supple, symmetrical, trachea midline and thyroid not enlarged, symmetric, no tenderness/mass/nodules Back: No CVA tenderness. Right paraspinous muscle tenderness. FROM pain in lumbar spine with flexion, extension, and lateral rotation Lungs: clear to auscultation bilaterally Heart: regular rate and rhythm, S1, S2 normal, no murmur, click, rub or gallop Extremities: extremities normal, atraumatic, no cyanosis or edema Pulses: 2+ and symmetric Skin: Skin color, texture, turgor normal. No rashes or lesions Neurologic: Grossly normal    Assessment:     Kevin Flores was seen today for back pain.  Diagnoses and all orders for this visit:  Acute bilateral low back pain without sciatica Due to ongoing pain will refer to PT. Continue flexeril as needed. Report any new or worsening symptoms.  -     Ambulatory referral to Physical Therapy -     methylPREDNISolone acetate (DEPO-MEDROL) injection 80 mg -     cyclobenzaprine (FLEXERIL) 5 MG tablet; Take 1 tablet  (5 mg total) by mouth 3 (three) times daily as needed for muscle spasms.     Plan:    Natural history and expected course discussed. Questions answered. Agricultural engineer distributed. Proper lifting, bending technique discussed. Stretching exercises discussed. Heat to affected area as needed for local pain relief. OTC analgesics as needed. Muscle relaxants per medication orders. PT referral. Follow-up in 6 weeks.    The above assessment and management plan was discussed with the patient. The patient verbalized understanding of and has agreed to the management plan. Patient is aware to call the clinic if symptoms fail to improve or worsen. Patient is aware when to return to the clinic for a follow-up visit. Patient educated on when it is appropriate to go to the emergency department.   Kari Baars, FNP-C [facility] [address] [place], [place] [address] [phone]

## 2018-10-09 NOTE — Patient Instructions (Signed)

## 2018-10-14 ENCOUNTER — Ambulatory Visit: Payer: Medicare Other | Attending: Family Medicine | Admitting: Physical Therapy

## 2018-10-14 ENCOUNTER — Encounter: Payer: Self-pay | Admitting: Physical Therapy

## 2018-10-14 ENCOUNTER — Other Ambulatory Visit: Payer: Self-pay

## 2018-10-14 DIAGNOSIS — G8929 Other chronic pain: Secondary | ICD-10-CM

## 2018-10-14 DIAGNOSIS — M545 Low back pain: Secondary | ICD-10-CM | POA: Diagnosis not present

## 2018-10-14 NOTE — Therapy (Signed)
[facility] [address] [place], [place], [address] Phone: [phone]   Fax:  [phone]  Physical Therapy Evaluation  Patient Details  Name: Kevin DrownJohnny Flores MRN: [id number] Date of Birth: 05/22/1941 Referring Provider (PT): Gilford SilviusLinda Rakes   Encounter Date: 10/14/2018  PT End of Session - 10/14/18 1434    Visit Number  1    Number of Visits  12    Date for PT Re-Evaluation  01/12/19    PT Start Time  0100    PT Stop Time  0143    PT Time Calculation (min)  43 min    Activity Tolerance  Patient tolerated treatment well    Behavior During Therapy  [facility] for tasks assessed/performed       Past Medical History:  Diagnosis Date  . Allergy    seasonal  . Arthritis   . CAD (coronary artery disease)    1998 LIMA to the LAD, SVG to circumflex. DES placed to the PDA 11/2009   . Gout   . Heartburn   . Hyperlipidemia   . Hypertension   . Nephrolithiasis    left  . Personal history of colonic polyps    adenomas  . Thyroid disease     Past Surgical History:  Procedure Laterality Date  . BIOPSY PROSTATE    . cardiac stents  2001   and [id number]  . COLONOSCOPY W/ POLYPECTOMY  2009   3 small adenomas  . CORONARY ARTERY BYPASS GRAFT  1998   2 bypass  . cysto with calculus manipulation    . UPPER GASTROINTESTINAL ENDOSCOPY      There were no vitals filed for this visit.   Subjective Assessment - 10/14/18 1422    Subjective  The patient reports an onset of low back pain (CC is right side) in October of 2019 for no apparent reason.  He thinks that it could be related to golf, however.  He also states he has had some kidneys problems in the past that made his low back hurt.  He is scheduled to see an Urologist soon.  His pain today is a 4/10.  Sitting straight up decreases his pain.  Getting in and out of the car increases his pain.      Pertinent History  Thyroid problem, CABG and stents    Patient Stated Goals  Get out of pain and golf again.     Currently in Pain?  Yes    Pain Score  4     Pain Location  Back    Pain Orientation  Right    Pain Descriptors / Indicators  Aching    Pain Type  Acute pain    Pain Onset  More than a month ago    Pain Frequency  Constant    Aggravating Factors   See above.    Pain Relieving Factors  See above.         [facility] PT Assessment - 10/14/18 0001      Assessment   Medical Diagnosis  Acute bilateral LBP.    Referring Provider (PT)  Gilford SilviusLinda Rakes    Onset Date/Surgical Date  --   October 2019.     Precautions   Precautions  None      Restrictions   Weight Bearing Restrictions  No      Balance Screen   Has the patient fallen in the past 6 months  No    Has the patient had a decrease in activity level because  of a fear of falling?   Yes    Is the patient reluctant to leave their home because of a fear of falling?   No      Prior Function   Level of Independence  Independent      Posture/Postural Control   Posture/Postural Control  Postural limitations    Postural Limitations  Rounded Shoulders;Forward head      ROM / Strength   AROM / PROM / Strength  AROM;Strength      AROM   Overall AROM Comments  Full active lumbar flexion and extension= 10 degrees.      Strength   Overall Strength Comments  Normal bilateral LE strength.      Palpation   Palpation comment  Very tender to palption over right lower lumbar musculature with increased tone in right QL.      Special Tests   Other special tests  Normal bilateral LE DTR's, equal leg lengths, (-) SLR testing.  Mildly positive right FABER testing.      Ambulation/Gait   Gait Comments  WNL.                Objective measurements completed on examination: See above findings.      [facility] Adult PT Treatment/Exercise - 10/14/18 0001      Modalities   Modalities  Electrical Stimulation;Moist Heat      Moist Heat Therapy   Number Minutes Moist Heat  15 Minutes    Moist Heat Location  Lumbar Spine       Electrical Stimulation   Electrical Stimulation Location  Right low back.    Electrical Stimulation Action  Pre-mod.    Electrical Stimulation Parameters  80-150 Hz x 15 minutes.    Electrical Stimulation Goals  Tone;Pain               PT Short Term Goals - 10/14/18 1606      PT SHORT TERM GOAL #1   Title  STG's=LTG's.        PT Long Term Goals - 10/14/18 1607      PT LONG TERM GOAL #1   Title  Independent with a HEP.    Time  6    Period  Weeks    Status  New      PT LONG TERM GOAL #2   Title  Sit to stand and in/out of car with pain not > 2/10.    Time  6    Period  Weeks    Status  New      PT LONG TERM GOAL #3   Title  Perform ADL's with pain not > 2/10.    Time  6    Period  Weeks    Status  New             Plan - 10/14/18 1551    Clinical Impression Statement  The patient presents to OPPT with c/o right sided low back pain.  He was very tender over his right lower lumbar region with increased tone in his right QL.  pain has impaired his functional mobility and he is unable to palying golf currently.  Patient will benefit from skilled physical therapy intervention to address deficits and pain.    History and Personal Factors relevant to plan of care:  Thyroid problem, CABG and stents    Clinical Presentation  Stable    Clinical Decision Making  Low    Rehab Potential  Excellent    PT Frequency  2x / week    PT Duration  6 weeks    PT Treatment/Interventions  ADLs/Self Care Home Management;Electrical Stimulation;Cryotherapy;Moist Heat;Therapeutic activities;Therapeutic exercise;Patient/family education;Manual techniques;Dry needling    PT Next Visit Plan  HMP/E'stim, Combo e'stim/U/S, STW/M and right QL release technique.  Core exercise progression.  SKTC, DKTC, hip bridges.    Consulted and Agree with Plan of Care  Patient       Patient will benefit from skilled therapeutic intervention in order to improve the following deficits and impairments:   Decreased activity tolerance, Pain, Increased muscle spasms, Postural dysfunction  Visit Diagnosis: Chronic right-sided low back pain without sciatica - Plan: PT plan of care cert/re-cert     Problem List Patient Active Problem List   Diagnosis Date Noted  . Other dysphagia 12/31/2017  . CHF (congestive heart failure) (HCC) 11/23/2017  . Plantar fasciitis 04/09/2017  . Achilles tendinitis of left lower extremity 04/09/2017  . Chronic gout of right ankle 02/12/2017  . Overweight (BMI 25.0-29.9) 05/21/2016  . Gastroesophageal reflux disease 10/17/2015  . Metabolic syndrome 10/17/2015  . Joint swelling 04/25/2015  . Vitamin D deficiency 04/06/2015  . Hypothyroidism 04/06/2015  . PALPITATIONS 12/21/2009  . Hyperlipidemia 12/04/2008  . Essential hypertension 12/04/2008  . Coronary atherosclerosis 12/04/2008  . SINUS BRADYCARDIA 12/04/2008    , [place] MPT 10/14/2018, 4:10 PM  [facility] [address] [place], [place], [address] Phone: [phone]   Fax:  [phone]  Name: Aethan Barnaba MRN: [id number] Date of Birth: 1940-12-26

## 2018-10-16 ENCOUNTER — Ambulatory Visit: Payer: Medicare Other | Admitting: Physical Therapy

## 2018-10-16 ENCOUNTER — Encounter: Payer: Self-pay | Admitting: Physical Therapy

## 2018-10-16 DIAGNOSIS — M545 Low back pain, unspecified: Secondary | ICD-10-CM

## 2018-10-16 DIAGNOSIS — G8929 Other chronic pain: Secondary | ICD-10-CM

## 2018-10-16 NOTE — Therapy (Addendum)
[facility] [address] [place], [place], [address] Phone: [phone]   Fax:  [phone]  Physical Therapy Treatment  Patient Details  Name: Kevin Flores MRN: [id number] Date of Birth: 12/01/1940 Referring Provider (PT): Gilford Silvius   Encounter Date: 10/16/2018  PT End of Session - 10/16/18 1430    Visit Number  2    Number of Visits  12    Date for PT Re-Evaluation  01/12/19    PT Start Time  1345    PT Stop Time  1440    PT Time Calculation (min)  55 min    Activity Tolerance  Patient tolerated treatment well    Behavior During Therapy  [facility] for tasks assessed/performed       Past Medical History:  Diagnosis Date  . Allergy    seasonal  . Arthritis   . CAD (coronary artery disease)    1998 LIMA to the LAD, SVG to circumflex. DES placed to the PDA 11/2009   . Gout   . Heartburn   . Hyperlipidemia   . Hypertension   . Nephrolithiasis    left  . Personal history of colonic polyps    adenomas  . Thyroid disease     Past Surgical History:  Procedure Laterality Date  . BIOPSY PROSTATE    . cardiac stents  2001   and [id number]  . COLONOSCOPY W/ POLYPECTOMY  2009   3 small adenomas  . CORONARY ARTERY BYPASS GRAFT  1998   2 bypass  . cysto with calculus manipulation    . UPPER GASTROINTESTINAL ENDOSCOPY      There were no vitals filed for this visit.  Subjective Assessment - 10/16/18 1428    Subjective  Patient reports feeling better with 1/10 right low back pain.     Pertinent History  Thyroid problem, CABG and stents    Patient Stated Goals  Get out of pain and golf again.    Currently in Pain?  Yes    Pain Score  1     Pain Location  Back    Pain Orientation  Right    Pain Descriptors / Indicators  Aching    Pain Type  Acute pain    Pain Onset  More than a month ago         [facility] PT Assessment - 10/16/18 0001      Assessment   Medical Diagnosis  Acute bilateral LBP.    Referring Provider (PT)  Gilford Silvius                    [facility] Adult PT Treatment/Exercise - 10/16/18 0001      Modalities   Modalities  Electrical Stimulation;Moist Heat      Moist Heat Therapy   Number Minutes Moist Heat  15 Minutes    Moist Heat Location  Lumbar Spine      Electrical Stimulation   Electrical Stimulation Location  Right low back.    Electrical Stimulation Action  Pre- mod    Electrical Stimulation Parameters  80-150 hz x15 min    Electrical Stimulation Goals  Tone;Pain      Manual Therapy   Manual Therapy  Soft tissue mobilization    Soft tissue mobilization  STW/M to right lumbar musculature, trigger point release to R QL to decrease tone and pain.        Combo US/E-stim 100% x12 minutes to decrease pain        PT Short  Term Goals - 10/14/18 1606      PT SHORT TERM GOAL #1   Title  STG's=LTG's.        PT Long Term Goals - 10/14/18 1607      PT LONG TERM GOAL #1   Title  Independent with a HEP.    Time  6    Period  Weeks    Status  New      PT LONG TERM GOAL #2   Title  Sit to stand and in/out of car with pain not > 2/10.    Time  6    Period  Weeks    Status  New      PT LONG TERM GOAL #3   Title  Perform ADL's with pain not > 2/10.    Time  6    Period  Weeks    Status  New            Plan - 10/16/18 1643    Clinical Impression Statement  Patient was able to tolerate combo US/e-stim and STW/M to R low back well. Patient noted with increased tone to lumbar paraspinals and right QL. Patient noted with palpable trigger point in QL to which released at end of session. Normal response to modalities upon removal. Patient reported decrease in pain at end of session.     Clinical Presentation  Stable    Clinical Decision Making  Low    Rehab Potential  Excellent    PT Frequency  2x / week    PT Duration  6 weeks    PT Treatment/Interventions  ADLs/Self Care Home Management;Electrical Stimulation;Cryotherapy;Moist Heat;Therapeutic activities;Therapeutic  exercise;Patient/family education;Manual techniques;Dry needling    PT Next Visit Plan  HMP/E'stim, Combo e'stim/U/S, STW/M and right QL release technique.  Core exercise progression.  SKTC, DKTC, hip bridges.    Consulted and Agree with Plan of Care  Patient       Patient will benefit from skilled therapeutic intervention in order to improve the following deficits and impairments:  Decreased activity tolerance, Pain, Increased muscle spasms, Postural dysfunction  Visit Diagnosis: Chronic right-sided low back pain without sciatica     Problem List Patient Active Problem List   Diagnosis Date Noted  . Other dysphagia 12/31/2017  . CHF (congestive heart failure) (HCC) 11/23/2017  . Plantar fasciitis 04/09/2017  . Achilles tendinitis of left lower extremity 04/09/2017  . Chronic gout of right ankle 02/12/2017  . Overweight (BMI 25.0-29.9) 05/21/2016  . Gastroesophageal reflux disease 10/17/2015  . Metabolic syndrome 10/17/2015  . Joint swelling 04/25/2015  . Vitamin D deficiency 04/06/2015  . Hypothyroidism 04/06/2015  . PALPITATIONS 12/21/2009  . Hyperlipidemia 12/04/2008  . Essential hypertension 12/04/2008  . Coronary atherosclerosis 12/04/2008  . SINUS BRADYCARDIA 12/04/2008   Guss Bunde, PT, DPT  10/16/2018, 4:47 PM  [facility] [address] [place], [place], [address] Phone: [phone]   Fax:  [phone]  Name: Shimon Kanitz MRN: [id number] Date of Birth: 1941-08-23

## 2018-10-21 ENCOUNTER — Encounter: Payer: Self-pay | Admitting: Physical Therapy

## 2018-10-21 ENCOUNTER — Ambulatory Visit: Payer: Medicare Other | Admitting: Physical Therapy

## 2018-10-21 NOTE — Therapy (Signed)
[facility] [address] [place], [place], [address] Phone: [phone]   Fax:  [phone]  Patient Details  Name: Kevin Flores MRN: [id number] Date of Birth: April 23, 1941 Referring Provider:  Junie Spencer, FNP  Encounter Date: 10/21/2018  Patient arrived to clinic with reports of no LBP and thinks that symptoms now are primarily from cysts covering his kidneys. No palpable R low back pain upon assessment. Patient to see urologist tomorrow. PTA discussed cancelling today's treatment due to lack of symptoms and check up tomorrow. [place], MPT consented to that as well.  Marvell Fuller, PTA 10/21/2018, 1:53 PM  [facility] [address] [place], [place], [address] Phone: [phone]   Fax:  [phone]

## 2018-10-22 ENCOUNTER — Ambulatory Visit (INDEPENDENT_AMBULATORY_CARE_PROVIDER_SITE_OTHER): Payer: Medicare Other | Admitting: Urology

## 2018-10-22 DIAGNOSIS — N281 Cyst of kidney, acquired: Secondary | ICD-10-CM | POA: Diagnosis not present

## 2018-10-22 DIAGNOSIS — N200 Calculus of kidney: Secondary | ICD-10-CM

## 2018-10-24 ENCOUNTER — Other Ambulatory Visit (HOSPITAL_COMMUNITY): Payer: Self-pay | Admitting: Urology

## 2018-10-24 DIAGNOSIS — N281 Cyst of kidney, acquired: Secondary | ICD-10-CM

## 2018-10-28 ENCOUNTER — Other Ambulatory Visit (HOSPITAL_COMMUNITY): Payer: Self-pay | Admitting: Urology

## 2018-11-04 ENCOUNTER — Other Ambulatory Visit: Payer: Self-pay | Admitting: *Deleted

## 2018-11-04 MED ORDER — ATORVASTATIN CALCIUM 20 MG PO TABS: 20.0000 mg | ORAL_TABLET | Freq: Every day | ORAL | 0 refills | Status: DC

## 2018-11-05 ENCOUNTER — Other Ambulatory Visit: Payer: Self-pay | Admitting: Student

## 2018-11-05 ENCOUNTER — Other Ambulatory Visit: Payer: Self-pay | Admitting: Radiology

## 2018-11-05 ENCOUNTER — Encounter: Payer: Self-pay | Admitting: Physical Therapy

## 2018-11-05 ENCOUNTER — Ambulatory Visit: Payer: Medicare Other | Admitting: Physical Therapy

## 2018-11-05 DIAGNOSIS — M545 Low back pain: Secondary | ICD-10-CM | POA: Diagnosis not present

## 2018-11-05 DIAGNOSIS — G8929 Other chronic pain: Secondary | ICD-10-CM

## 2018-11-05 NOTE — Therapy (Signed)
[facility] [address] [place], [place], [address] Phone: [phone]   Fax:  [phone]  Physical Therapy Treatment  Patient Details  Name: Kevin Flores MRN: [id number] Date of Birth: 26-Aug-1941 Referring Provider (PT): Gilford Silvius   Encounter Date: 11/05/2018  PT End of Session - 11/05/18 1339    Visit Number  3    Number of Visits  12    Date for PT Re-Evaluation  01/12/19    PT Start Time  1300    PT Stop Time  1348    PT Time Calculation (min)  48 min    Activity Tolerance  Patient tolerated treatment well    Behavior During Therapy  [facility] for tasks assessed/performed       Past Medical History:  Diagnosis Date  . Allergy    seasonal  . Arthritis   . CAD (coronary artery disease)    1998 LIMA to the LAD, SVG to circumflex. DES placed to the PDA 11/2009   . Gout   . Heartburn   . Hyperlipidemia   . Hypertension   . Nephrolithiasis    left  . Personal history of colonic polyps    adenomas  . Thyroid disease     Past Surgical History:  Procedure Laterality Date  . BIOPSY PROSTATE    . cardiac stents  2001   and [id number]  . COLONOSCOPY W/ POLYPECTOMY  2009   3 small adenomas  . CORONARY ARTERY BYPASS GRAFT  1998   2 bypass  . cysto with calculus manipulation    . UPPER GASTROINTESTINAL ENDOSCOPY      There were no vitals filed for this visit.  Subjective Assessment - 11/05/18 1338    Subjective  Patient arrives with right low back pain as 8/10    Pertinent History  Thyroid problem, CABG and stents    Patient Stated Goals  Get out of pain and golf again.    Currently in Pain?  Yes    Pain Score  8     Pain Location  Back    Pain Orientation  Right    Pain Descriptors / Indicators  Aching    Pain Type  Acute pain    Pain Onset  More than a month ago    Pain Frequency  Constant         [facility] PT Assessment - 11/05/18 0001      Assessment   Medical Diagnosis  Acute bilateral LBP.    Referring Provider  (PT)  Gilford Silvius                   [facility] Adult PT Treatment/Exercise - 11/05/18 0001      Modalities   Modalities  Electrical Stimulation;Moist Heat;Ultrasound      Moist Heat Therapy   Number Minutes Moist Heat  15 Minutes      Electrical Stimulation   Electrical Stimulation Location  Right low back.    Electrical Stimulation Action  Pre-mod    Electrical Stimulation Parameters  80-150 hz x15 mins    Electrical Stimulation Goals  Pain;Tone      Ultrasound   Ultrasound Location  right low back    Ultrasound Parameters  Combo e-stim/US, 100% 1.5 w/cm2,  x12 mins    Ultrasound Goals  Pain      Manual Therapy   Manual Therapy  Soft tissue mobilization    Soft tissue mobilization  STW/M to right lumbar musculature, trigger point release to  R QL to decrease tone and pain.                PT Short Term Goals - 10/14/18 1606      PT SHORT TERM GOAL #1   Title  STG's=LTG's.        PT Long Term Goals - 10/14/18 1607      PT LONG TERM GOAL #1   Title  Independent with a HEP.    Time  6    Period  Weeks    Status  New      PT LONG TERM GOAL #2   Title  Sit to stand and in/out of car with pain not > 2/10.    Time  6    Period  Weeks    Status  New      PT LONG TERM GOAL #3   Title  Perform ADL's with pain not > 2/10.    Time  6    Period  Weeks    Status  New            Plan - 11/05/18 1339    Clinical Impression Statement  Patient was able to tolerate treatment well despite pain at start of session. Patient noted with slow transitional movements from supine to sidelying. Patient denied increase of pain with modalities. Normal response to modalities upon removal.     Clinical Presentation  Stable    Clinical Decision Making  Low    Rehab Potential  Excellent    PT Frequency  2x / week    PT Duration  6 weeks    PT Treatment/Interventions  ADLs/Self Care Home Management;Electrical Stimulation;Cryotherapy;Moist Heat;Therapeutic  activities;Therapeutic exercise;Patient/family education;Manual techniques;Dry needling    PT Next Visit Plan  HMP/E'stim, Combo e'stim/U/S, STW/M and right QL release technique.  Core exercise progression.  SKTC, DKTC, hip bridges.    Consulted and Agree with Plan of Care  Patient       Patient will benefit from skilled therapeutic intervention in order to improve the following deficits and impairments:  Decreased activity tolerance, Pain, Increased muscle spasms, Postural dysfunction  Visit Diagnosis: Chronic right-sided low back pain without sciatica     Problem List Patient Active Problem List   Diagnosis Date Noted  . Other dysphagia 12/31/2017  . CHF (congestive heart failure) (HCC) 11/23/2017  . Plantar fasciitis 04/09/2017  . Achilles tendinitis of left lower extremity 04/09/2017  . Chronic gout of right ankle 02/12/2017  . Overweight (BMI 25.0-29.9) 05/21/2016  . Gastroesophageal reflux disease 10/17/2015  . Metabolic syndrome 10/17/2015  . Joint swelling 04/25/2015  . Vitamin D deficiency 04/06/2015  . Hypothyroidism 04/06/2015  . PALPITATIONS 12/21/2009  . Hyperlipidemia 12/04/2008  . Essential hypertension 12/04/2008  . Coronary atherosclerosis 12/04/2008  . SINUS BRADYCARDIA 12/04/2008   Guss Bunde, PT, DPT 11/05/2018, 3:31 PM  [facility] [address] [place], [place], [address] Phone: [phone]   Fax:  [phone]  Name: Kevin Flores MRN: [id number] Date of Birth: August 09, 1941

## 2018-11-06 ENCOUNTER — Ambulatory Visit (HOSPITAL_COMMUNITY)
Admission: RE | Admit: 2018-11-06 | Discharge: 2018-11-06 | Disposition: A | Discharge status: Home / Self Care | Payer: Medicare Other | Source: Ambulatory Visit

## 2018-11-06 ENCOUNTER — Other Ambulatory Visit: Payer: Self-pay

## 2018-11-06 ENCOUNTER — Ambulatory Visit (HOSPITAL_COMMUNITY)
Admission: RE | Admit: 2018-11-06 | Discharge: 2018-11-06 | Disposition: A | Discharge status: Home / Self Care | Payer: Medicare Other | Source: Ambulatory Visit | Attending: Urology | Admitting: Urology

## 2018-11-06 ENCOUNTER — Other Ambulatory Visit (HOSPITAL_COMMUNITY): Payer: Self-pay | Admitting: Urology

## 2018-11-06 DIAGNOSIS — Z951 Presence of aortocoronary bypass graft: Secondary | ICD-10-CM | POA: Insufficient documentation

## 2018-11-06 DIAGNOSIS — Z955 Presence of coronary angioplasty implant and graft: Secondary | ICD-10-CM | POA: Diagnosis not present

## 2018-11-06 DIAGNOSIS — I10 Essential (primary) hypertension: Secondary | ICD-10-CM | POA: Diagnosis not present

## 2018-11-06 DIAGNOSIS — M1990 Unspecified osteoarthritis, unspecified site: Secondary | ICD-10-CM | POA: Insufficient documentation

## 2018-11-06 DIAGNOSIS — Z87442 Personal history of urinary calculi: Secondary | ICD-10-CM | POA: Insufficient documentation

## 2018-11-06 DIAGNOSIS — Z7982 Long term (current) use of aspirin: Secondary | ICD-10-CM | POA: Diagnosis not present

## 2018-11-06 DIAGNOSIS — I2510 Atherosclerotic heart disease of native coronary artery without angina pectoris: Secondary | ICD-10-CM | POA: Diagnosis not present

## 2018-11-06 DIAGNOSIS — N281 Cyst of kidney, acquired: Secondary | ICD-10-CM

## 2018-11-06 DIAGNOSIS — M549 Dorsalgia, unspecified: Secondary | ICD-10-CM | POA: Diagnosis not present

## 2018-11-06 DIAGNOSIS — R300 Dysuria: Secondary | ICD-10-CM | POA: Diagnosis not present

## 2018-11-06 DIAGNOSIS — E785 Hyperlipidemia, unspecified: Secondary | ICD-10-CM | POA: Diagnosis not present

## 2018-11-06 DIAGNOSIS — Z79899 Other long term (current) drug therapy: Secondary | ICD-10-CM | POA: Insufficient documentation

## 2018-11-06 DIAGNOSIS — M109 Gout, unspecified: Secondary | ICD-10-CM | POA: Diagnosis not present

## 2018-11-06 DIAGNOSIS — E079 Disorder of thyroid, unspecified: Secondary | ICD-10-CM | POA: Diagnosis not present

## 2018-11-06 LAB — CBC
HCT: 49.5 % (ref 39.0–52.0)
Hemoglobin: 15.9 g/dL (ref 13.0–17.0)
MCH: 29 pg (ref 26.0–34.0)
MCHC: 32.1 g/dL (ref 30.0–36.0)
MCV: 90.2 fL (ref 80.0–100.0)
NRBC: 0 % (ref 0.0–0.2)
Platelets: 125 10*3/uL — ABNORMAL LOW (ref 150–400)
RBC: 5.49 MIL/uL (ref 4.22–5.81)
RDW: 14.3 % (ref 11.5–15.5)
WBC: 9.7 10*3/uL (ref 4.0–10.5)

## 2018-11-06 LAB — PROTIME-INR
INR: 1.1 (ref 0.8–1.2)
Prothrombin Time: 13.7 seconds (ref 11.4–15.2)

## 2018-11-06 MED ORDER — MIDAZOLAM HCL 2 MG/2ML IJ SOLN
INTRAMUSCULAR | Status: AC
  Filled 2018-11-06: qty 2

## 2018-11-06 MED ORDER — SODIUM CHLORIDE 0.9 % IV SOLN: INTRAVENOUS | Status: DC

## 2018-11-06 MED ORDER — MIDAZOLAM HCL 2 MG/2ML IJ SOLN
INTRAMUSCULAR | Status: DC | PRN
  Administered 2018-11-06 (×2): 1 mg via INTRAVENOUS

## 2018-11-06 MED ORDER — FENTANYL CITRATE (PF) 100 MCG/2ML IJ SOLN
INTRAMUSCULAR | Status: DC | PRN
  Administered 2018-11-06: 50 ug via INTRAVENOUS

## 2018-11-06 MED ORDER — FENTANYL CITRATE (PF) 100 MCG/2ML IJ SOLN
INTRAMUSCULAR | Status: AC
  Filled 2018-11-06: qty 2

## 2018-11-06 MED ORDER — LIDOCAINE-EPINEPHRINE 1 %-1:100000 IJ SOLN
INTRAMUSCULAR | Status: AC
  Filled 2018-11-06: qty 1

## 2018-11-06 NOTE — Procedures (Signed)
Pre procedural Dx: Symptomatic left sided renal cysts Post procedural Dx: Same  Technically successful [place] and CT guided aspiration of 5 discrete left sided renal cysts yielding a total of 270 cc of serous fluid. A sample of aspirated fluid was sent to the laboratory for analysis.    EBL: None Complications: None immediate  Katherina Right, MD Pager #: [phone]

## 2018-11-06 NOTE — H&P (Signed)
Referring Physician(s): McKenzie,Patrick L  Supervising Physician: Simonne Come  Patient Status:  [facility] OP  Chief Complaint: Back pain, recurrent renal cysts   Subjective: Patient familiar to IR service from prior CT-guided aspiration of left renal cysts in 2013.  Cytology was negative.  He does have a history of chronic left renal cysts as well as nephrolithiasis.  He recently injured his back with musculoskeletal strain following working under kitchen sink at home. Latest CT scan shows no acute abnormality in the abdomen or pelvis, unchanged bilateral nephrolithiasis, no ureteral calculi or hydronephrosis, bilateral renal cysts.  He presents again today for CT-guided left renal cysts aspiration.  He currently denies fever, headache, chest pain, dyspnea, cough, abdominal pain, nausea, vomiting or bleeding. He does have some slight dysuria.  Past Medical History:  Diagnosis Date  . Allergy    seasonal  . Arthritis   . CAD (coronary artery disease)    1998 LIMA to the LAD, SVG to circumflex. DES placed to the PDA 11/2009   . Gout   . Heartburn   . Hyperlipidemia   . Hypertension   . Nephrolithiasis    left  . Personal history of colonic polyps    adenomas  . Thyroid disease    Past Surgical History:  Procedure Laterality Date  . BIOPSY PROSTATE    . cardiac stents  2001   and 67544  . COLONOSCOPY W/ POLYPECTOMY  2009   3 small adenomas  . CORONARY ARTERY BYPASS GRAFT  1998   2 bypass  . cysto with calculus manipulation    . UPPER GASTROINTESTINAL ENDOSCOPY       Allergies: Imdur [isosorbide dinitrate]; Rosuvastatin; and Sulfonamide derivatives  Medications: Prior to Admission medications   Medication Sig Start Date End Date Taking? Authorizing Provider  albuterol (PROVENTIL HFA;VENTOLIN HFA) 108 (90 BASE) MCG/ACT inhaler Inhale 2 puffs into the lungs every 6 (six) hours as needed for wheezing or shortness of breath. 08/19/13   Coralie Keens, FNP  allopurinol  (ZYLOPRIM) 300 MG tablet Take 1 tablet (300 mg total) by mouth daily. 09/08/18   Jannifer Rodney A, FNP  amLODipine (NORVASC) 10 MG tablet Take 1 tablet (10 mg total) by mouth daily. 11/25/17   Berton Bon, NP  aspirin 81 MG chewable tablet Chew 1 tablet (81 mg total) by mouth daily. 11/25/17   Berton Bon, NP  atorvastatin (LIPITOR) 20 MG tablet Take 1 tablet (20 mg total) by mouth daily at 6 PM. 11/04/18   Jannifer Rodney A, FNP  Cholecalciferol (VITAMIN D-3 PO) Take 1 capsule by mouth daily.    [provider]  cyclobenzaprine (FLEXERIL) 5 MG tablet Take 1 tablet (5 mg total) by mouth 3 (three) times daily as needed for muscle spasms. 10/09/18   Sonny Masters, FNP  fish oil-omega-3 fatty acids 1000 MG capsule Take 1 g by mouth at bedtime.     [provider]  fluticasone (FLONASE) 50 MCG/ACT nasal spray Place 2 sprays into both nostrils daily. 12/03/17   [name], MD  furosemide (LASIX) 20 MG tablet Take 1 tablet (20 mg total) by mouth every other day. 07/18/18   Junie Spencer, FNP  HYDROcodone-acetaminophen (NORCO/VICODIN) 5-325 MG tablet Take 1 tablet by mouth every 4 (four) hours as needed. Patient taking differently: Take 1 tablet by mouth every 4 (four) hours as needed for severe pain.  09/13/18   Burgess Amor, PA-C  levothyroxine (SYNTHROID, LEVOTHROID) 50 MCG tablet TAKE 1 TABLET BY MOUTH  IN THE MORNING Patient taking differently: Take 50 mcg by mouth daily before breakfast. TAKE 1 TABLET BY MOUTH IN THE MORNING 07/18/18   Jannifer Rodney A, FNP  metoprolol succinate (TOPROL XL) 25 MG 24 hr tablet Take 1 tablet (25 mg total) by mouth daily. 07/18/18 07/18/19  Junie Spencer, FNP  naproxen (NAPROSYN) 500 MG tablet Take 1 tablet (500 mg total) by mouth 2 (two) times daily as needed (for gout flares/pain). Patient taking differently: Take 500 mg by mouth daily as needed (for gout flares/pain).  06/12/18   Jannifer Rodney A, FNP  pantoprazole (PROTONIX) 40 MG tablet  Take 1 tablet (40 mg total) by mouth daily. 07/18/18   Junie Spencer, FNP  vitamin C (ASCORBIC ACID) 500 MG tablet Take 500 mg by mouth daily.    [provider]     Vital Signs: BP (!) 146/102   Pulse 81   Temp (!) 97.4 F (36.3 C)   Ht 5\' 9"  (1.753 m)   Wt 205 lb (93 kg)   SpO2 98%   BMI 30.27 kg/m   Physical Exam awake, alert.  Chest clear to auscultation bilaterally.  Heart with regular rate and rhythm.  Abdomen soft, positive bowel sounds, right lateral abdominal/right flank discomfort to palpation; trace pretibial edema bilaterally.  Imaging: No results found.  Labs:  CBC: Recent Labs    11/25/17 0522 07/18/18 1235 09/13/18 1040 10/07/18 1007  WBC 9.2 8.9 11.4* 9.6  HGB 14.7 15.5 14.3 15.1  HCT 44.3 44.5 44.5 47.8  PLT 134* 217 156 151    COAGS: No results for input(s): INR, APTT in the last 8760 hours.  BMP: Recent Labs    04/02/18 1551 07/18/18 1235 09/13/18 1040 10/07/18 1007  NA 142 143 140 138  K 3.8 4.1 3.7 3.6  CL 103 103 109 106  CO2 25 25 25 25   GLUCOSE 107* 86 123* 108*  BUN 17 18 22 19   CALCIUM 9.7 9.7 8.8* 9.3  CREATININE 1.63* 1.68* 1.71* 1.40*  GFRNONAA 41* 39* 38* 49*  GFRAA 47* 45* 44* 57*    LIVER FUNCTION TESTS: Recent Labs    07/18/18 1235 09/13/18 1040  BILITOT 0.7 1.4*  AST 22 21  ALT 18 20  ALKPHOS 114 86  PROT 6.9 7.2  ALBUMIN 4.3 3.7    Assessment and Plan: Patient with history of chronic nephrolithiasis as well as bilateral renal cysts.  Status post CT-guided aspiration in 2013 of left renal cysts with negative cytology.  Recent imaging has revealed persistent bilateral renal cysts, largest on the left.  He presents again today as referral from urology for CT-guided aspiration of the largest left renal cysts. Risks and benefits of procedure was discussed with the patient and/or patient's family including, but not limited to bleeding, infection, damage to adjacent structures or low yield requiring  additional tests.  All of the questions were answered and there is agreement to proceed.  Consent signed and in chart.  LABS PENDING  Electronically Signed: D. Jeananne Rama, PA-C 11/06/2018, 9:58 AM   I spent a total of 25 minutes at the the patient's bedside AND on the patient's hospital floor or unit, greater than 50% of which was counseling/coordinating care for CT guided aspiration of left renal cysts

## 2018-11-06 NOTE — Discharge Instructions (Signed)
Percutaneous Kidney Biopsy, Care After  This sheet gives you information about how to care for yourself after your procedure. Your health care provider may also give you more specific instructions. If you have problems or questions, contact your health care provider.  What can I expect after the procedure?  After the procedure, it is common to have:   Pain or soreness near the area where the needle went through your skin (biopsy site).   Bright pink or cloudy urine for 24 hours after the procedure.  Follow these instructions at home:  Activity   Return to your normal activities as told by your health care provider. Ask your health care provider what activities are safe for you.   Do not drive for 24 hours if you were given a medicine to help you relax (sedative).   Do not lift anything that is heavier than 10 lb (4.5 kg) until your health care provider tells you that it is safe.   Avoid activities that take a lot of effort (are strenuous) until your health care provider approves. Most people will have to wait 2 weeks before returning to activities such as exercise or sexual intercourse.  General instructions     Take over-the-counter and prescription medicines only as told by your health care provider.   You may eat and drink after your procedure. Follow instructions from your health care provider about eating or drinking restrictions.   Check your biopsy site every day for signs of infection. Check for:  ? More redness, swelling, or pain.  ? More fluid or blood.  ? Warmth.  ? Pus or a bad smell.   Keep all follow-up visits as told by your health care provider. This is important.  Contact a health care provider if:   You have more redness, swelling, or pain around your biopsy site.   You have more fluid or blood coming from your biopsy site.   Your biopsy site feels warm to the touch.   You have pus or a bad smell coming from your biopsy site.   You have blood in your urine more than 24 hours after  your procedure.  Get help right away if:   You have dark red or brown urine.   You have a fever.   You are unable to urinate.   You feel burning when you urinate.   You feel faint.   You have severe pain in your abdomen or side.  This information is not intended to replace advice given to you by your health care provider. Make sure you discuss any questions you have with your health care provider.  Document Released: 04/29/2013 Document Revised: 06/08/2016 Document Reviewed: 06/08/2016  Elsevier Interactive Patient Education  2019 [employer] Inc[employer]

## 2018-11-11 ENCOUNTER — Ambulatory Visit: Payer: Medicare Other | Attending: Family Medicine | Admitting: *Deleted

## 2018-11-11 DIAGNOSIS — G8929 Other chronic pain: Secondary | ICD-10-CM | POA: Insufficient documentation

## 2018-11-11 DIAGNOSIS — M545 Low back pain: Secondary | ICD-10-CM | POA: Insufficient documentation

## 2018-11-11 NOTE — Therapy (Signed)
[facility] [address] [place], [place], [address] Phone: [phone]   Fax:  [phone]  Physical Therapy Treatment  Patient Details  Name: Kevin Flores MRN: [id number] Date of Birth: 1940/11/02 Referring Provider (PT): Gilford Silvius   Encounter Date: 11/11/2018  PT End of Session - 11/11/18 1344    Visit Number  4    Number of Visits  12    Date for PT Re-Evaluation  01/12/19    PT Start Time  1300    PT Stop Time  1349    PT Time Calculation (min)  49 min       Past Medical History:  Diagnosis Date  . Allergy    seasonal  . Arthritis   . CAD (coronary artery disease)    1998 LIMA to the LAD, SVG to circumflex. DES placed to the PDA 11/2009   . Gout   . Heartburn   . Hyperlipidemia   . Hypertension   . Nephrolithiasis    left  . Personal history of colonic polyps    adenomas  . Thyroid disease     Past Surgical History:  Procedure Laterality Date  . BIOPSY PROSTATE    . cardiac stents  2001   and 95638  . COLONOSCOPY W/ POLYPECTOMY  2009   3 small adenomas  . CORONARY ARTERY BYPASS GRAFT  1998   2 bypass  . cysto with calculus manipulation    . UPPER GASTROINTESTINAL ENDOSCOPY      There were no vitals filed for this visit.  Subjective Assessment - 11/11/18 1302    Subjective  Patient arrives with right low back pain as 2/10  (Pended)     Pertinent History  Thyroid problem, CABG and stents  (Pended)     Patient Stated Goals  Get out of pain and golf again.  (Pended)     Currently in Pain?  Yes  (Pended)     Pain Score  2   (Pended)     Pain Location  Back  (Pended)     Pain Orientation  Right  (Pended)                        [facility] Adult PT Treatment/Exercise - 11/11/18 0001      Modalities   Modalities  Electrical Stimulation;Moist Heat;Ultrasound      Moist Heat Therapy   Number Minutes Moist Heat  15 Minutes    Moist Heat Location  --   thoracolumbar     Electrical Stimulation   Electrical Stimulation Location  RT thoracolumbar premod 80-150hz  x 15 mins    Electrical Stimulation Goals  Pain;Tone      Ultrasound   Ultrasound Location  RT thoracolumbar mm    Ultrasound Parameters  Combo x 12 mins 1.5 w/cm2     Ultrasound Goals  Pain      Manual Therapy   Manual Therapy  Soft tissue mobilization    Soft tissue mobilization  STW/M to right thoracolumbar musculature, trigger point release to R QL to decrease tone and pain.                PT Short Term Goals - 10/14/18 1606      PT SHORT TERM GOAL #1   Title  STG's=LTG's.        PT Long Term Goals - 10/14/18 1607      PT LONG TERM GOAL #1   Title  Independent with a HEP.  Time  6    Period  Weeks    Status  New      PT LONG TERM GOAL #2   Title  Sit to stand and in/out of car with pain not > 2/10.    Time  6    Period  Weeks    Status  New      PT LONG TERM GOAL #3   Title  Perform ADL's with pain not > 2/10.    Time  6    Period  Weeks    Status  New            Plan - 11/11/18 1345    Clinical Impression Statement  Pt arrived today reporting decreased pain in his back. 2-3/10 today. Pt did great with RX and had notable decreased pain/ tension in RT side thoracolumbar mm/flank area. Normal modality response today    Rehab Potential  Excellent    PT Frequency  2x / week    PT Duration  6 weeks    PT Treatment/Interventions  ADLs/Self Care Home Management;Electrical Stimulation;Cryotherapy;Moist Heat;Therapeutic activities;Therapeutic exercise;Patient/family education;Manual techniques;Dry needling    PT Next Visit Plan  HMP/E'stim, Combo e'stim/U/S, STW/M and right QL release technique.  Core exercise progression.  SKTC, DKTC, hip bridges.    Consulted and Agree with Plan of Care  Patient       Patient will benefit from skilled therapeutic intervention in order to improve the following deficits and impairments:  Decreased activity tolerance, Pain, Increased muscle spasms,  Postural dysfunction  Visit Diagnosis: Chronic right-sided low back pain without sciatica     Problem List Patient Active Problem List   Diagnosis Date Noted  . Other dysphagia 12/31/2017  . CHF (congestive heart failure) (HCC) 11/23/2017  . Plantar fasciitis 04/09/2017  . Achilles tendinitis of left lower extremity 04/09/2017  . Chronic gout of right ankle 02/12/2017  . Overweight (BMI 25.0-29.9) 05/21/2016  . Gastroesophageal reflux disease 10/17/2015  . Metabolic syndrome 10/17/2015  . Joint swelling 04/25/2015  . Vitamin D deficiency 04/06/2015  . Hypothyroidism 04/06/2015  . PALPITATIONS 12/21/2009  . Hyperlipidemia 12/04/2008  . Essential hypertension 12/04/2008  . Coronary atherosclerosis 12/04/2008  . SINUS BRADYCARDIA 12/04/2008    Keyaria Lawson,CHRIS , PTA 11/11/2018, 1:58 PM  [facility] [address] [place], [place], [address] Phone: [phone]   Fax:  [phone]  Name: Toddy Waugh MRN: [id number] Date of Birth: August 17, 1941

## 2018-11-18 ENCOUNTER — Ambulatory Visit: Payer: Medicare Other | Admitting: *Deleted

## 2018-11-18 DIAGNOSIS — M545 Low back pain, unspecified: Secondary | ICD-10-CM

## 2018-11-18 DIAGNOSIS — G8929 Other chronic pain: Secondary | ICD-10-CM | POA: Diagnosis not present

## 2018-11-18 NOTE — Therapy (Signed)
[facility] [address] [place], [place], [address] Phone: [phone]   Fax:  [phone]  Physical Therapy Treatment  Patient Details  Name: Marcella Brassard MRN: [id number] Date of Birth: [birth year] Referring Provider (PT): Gilford Silvius   Encounter Date: 11/18/2018  PT End of Session - 11/18/18 1338    Visit Number  5    Number of Visits  12    Date for PT Re-Evaluation  01/12/19    PT Start Time  1300    PT Stop Time  1350    PT Time Calculation (min)  50 min       Past Medical History:  Diagnosis Date  . Allergy    seasonal  . Arthritis   . CAD (coronary artery disease)    1998 LIMA to the LAD, SVG to circumflex. DES placed to the PDA 11/2009   . Gout   . Heartburn   . Hyperlipidemia   . Hypertension   . Nephrolithiasis    left  . Personal history of colonic polyps    adenomas  . Thyroid disease     Past Surgical History:  Procedure Laterality Date  . BIOPSY PROSTATE    . cardiac stents  2001   and [address]  . COLONOSCOPY W/ POLYPECTOMY  2009   3 small adenomas  . CORONARY ARTERY BYPASS GRAFT  1998   2 bypass  . cysto with calculus manipulation    . UPPER GASTROINTESTINAL ENDOSCOPY      There were no vitals filed for this visit.  Subjective Assessment - 11/18/18 1300    Subjective  Patient arrives with right low back pain as 2/10 and doing well    Pertinent History  Thyroid problem, CABG and stents    Patient Stated Goals  Get out of pain and golf again.    Pain Location  Back    Pain Onset  More than a month ago    Pain Frequency  Constant                       [facility] Adult PT Treatment/Exercise - 11/18/18 0001      Moist Heat Therapy   Number Minutes Moist Heat  15 Minutes    Moist Heat Location  --   thoracolumbar     Electrical Stimulation   Electrical Stimulation Location  RT thoracolumbar premod 80-150hz  x 15 mins    Electrical Stimulation Goals  Pain;Tone      Ultrasound   Ultrasound Location  RT thoracolumbar paras    Ultrasound Parameters  Combo x 12 mins 80-150 hz      Manual Therapy   Manual Therapy  Soft tissue mobilization    Soft tissue mobilization  STW/M to right thoracolumbar musculature, trigger point release to R QL to decrease tone and pain.                PT Short Term Goals - 10/14/18 1606      PT SHORT TERM GOAL #1   Title  STG's=LTG's.        PT Long Term Goals - 10/14/18 1607      PT LONG TERM GOAL #1   Title  Independent with a HEP.    Time  6    Period  Weeks    Status  New      PT LONG TERM GOAL #2   Title  Sit to stand and in/out of car with pain not > 2/10.  Time  6    Period  Weeks    Status  New      PT LONG TERM GOAL #3   Title  Perform ADL's with pain not > 2/10.    Time  6    Period  Weeks    Status  New            Plan - 11/18/18 1340    Clinical Impression Statement  Pt arrived today doing very well with minimal LBP. He did great with Rx and reports ADLs are easier now. Notable decrease in tension RT LB paras during STW.    Rehab Potential  Excellent    PT Frequency  2x / week    PT Duration  6 weeks    PT Treatment/Interventions  ADLs/Self Care Home Management;Electrical Stimulation;Cryotherapy;Moist Heat;Therapeutic activities;Therapeutic exercise;Patient/family education;Manual techniques;Dry needling    PT Next Visit Plan  DC after next visit if doing well    Consulted and Agree with Plan of Care  Patient       Patient will benefit from skilled therapeutic intervention in order to improve the following deficits and impairments:  Decreased activity tolerance, Pain, Increased muscle spasms, Postural dysfunction  Visit Diagnosis: Chronic right-sided low back pain without sciatica     Problem List Patient Active Problem List   Diagnosis Date Noted  . Other dysphagia 12/31/2017  . CHF (congestive heart failure) (HCC) 11/23/2017  . Plantar fasciitis 04/09/2017  . Achilles  tendinitis of left lower extremity 04/09/2017  . Chronic gout of right ankle 02/12/2017  . Overweight (BMI 25.0-29.9) 05/21/2016  . Gastroesophageal reflux disease 10/17/2015  . Metabolic syndrome 10/17/2015  . Joint swelling 04/25/2015  . Vitamin D deficiency 04/06/2015  . Hypothyroidism 04/06/2015  . PALPITATIONS 12/21/2009  . Hyperlipidemia 12/04/2008  . Essential hypertension 12/04/2008  . Coronary atherosclerosis 12/04/2008  . SINUS BRADYCARDIA 12/04/2008    Sanders Manninen,CHRIS, PTA 11/18/2018, 5:44 PM  [facility] [address] [place], [place], [address] Phone: [phone]   Fax:  [phone]  Name: Alto Mesmer MRN: [id number] Date of Birth: 1940/09/28

## 2018-11-25 ENCOUNTER — Ambulatory Visit: Payer: Medicare Other | Admitting: *Deleted

## 2018-11-25 ENCOUNTER — Other Ambulatory Visit: Payer: Self-pay

## 2018-11-25 DIAGNOSIS — G8929 Other chronic pain: Secondary | ICD-10-CM | POA: Diagnosis not present

## 2018-11-25 DIAGNOSIS — M545 Low back pain, unspecified: Secondary | ICD-10-CM

## 2018-11-25 NOTE — Therapy (Signed)
[facility] [place], [place], [address] Phone: [phone]   Fax:  [phone]  Physical Therapy Treatment  Patient Details  Name: Kevin Flores MRN: [id number] Date of Birth: [birth year] Referring Provider (PT): Darla Lesches   Encounter Date: 11/25/2018  PT End of Session - 11/25/18 1338    Visit Number  6    Number of Visits  12    Date for PT Re-Evaluation  01/12/19    PT Start Time  1300    PT Stop Time  1350    PT Time Calculation (min)  50 min       Past Medical History:  Diagnosis Date  . Allergy    seasonal  . Arthritis   . CAD (coronary artery disease)    1998 LIMA to the LAD, SVG to circumflex. DES placed to the PDA 11/2009   . Gout   . Heartburn   . Hyperlipidemia   . Hypertension   . Nephrolithiasis    left  . Personal history of colonic polyps    adenomas  . Thyroid disease     Past Surgical History:  Procedure Laterality Date  . BIOPSY PROSTATE    . cardiac stents  2001   and [address]11  . COLONOSCOPY W/ POLYPECTOMY  2009   3 small adenomas  . CORONARY ARTERY BYPASS GRAFT  1998   2 bypass  . cysto with calculus manipulation    . UPPER GASTROINTESTINAL ENDOSCOPY      There were no vitals filed for this visit.  Subjective Assessment - 11/25/18 1301    Subjective  Doing ok DC after today's Rx    Pertinent History  Thyroid problem, CABG and stents    Patient Stated Goals  Get out of pain and golf again.    Currently in Pain?  Yes    Pain Score  1     Pain Location  Back    Pain Orientation  Right    Pain Descriptors / Indicators  Aching    Pain Frequency  Intermittent                       [facility] Adult PT Treatment/Exercise - 11/25/18 0001      Modalities   Modalities  Electrical Stimulation;Moist Heat;Ultrasound      Moist Heat Therapy   Number Minutes Moist Heat  15 Minutes    Moist Heat Location  Lumbar Spine      Electrical Stimulation   Electrical Stimulation  Location  RT thoracolumbar premod 80-_0  x 15 mins    Electrical Stimulation Goals  Pain;Tone      Ultrasound   Ultrasound Location  RT thoracolumbar paras    Ultrasound Parameters  Combo x12 mins 1.5 w/cm2    Ultrasound Goals  Pain      Manual Therapy   Manual Therapy  Soft tissue mobilization    Soft tissue mobilization  STW/M to right thoracolumbar musculature, trigger point release to R QL to decrease tone and pain.                PT Short Term Goals - 10/14/18 1606      PT SHORT TERM GOAL #1   Title  STG's=LTG's.        PT Long Term Goals - 11/25/18 1341      PT LONG TERM GOAL #1   Title  Independent with a HEP.    Period  Weeks    Status  Achieved      PT LONG TERM GOAL #2   Title  Sit to stand and in/out of car with pain not > 2/10.    Time  6    Period  Weeks    Status  Achieved      PT LONG TERM GOAL #3   Title  Perform ADL's with pain not > 2/10.    Time  6    Period  Weeks    Status  Achieved            Plan - 11/25/18 1342    Clinical Impression Statement  Pt arrived today reporting that he did a lot of yard work over the last few days and did very well and is ready for DC. Pt did well with Rx with minimal tightness and soreness noted during STW.    Clinical Decision Making  Low    Rehab Potential  Excellent    PT Frequency  2x / week    PT Duration  6 weeks    PT Treatment/Interventions  ADLs/Self Care Home Management;Electrical Stimulation;Cryotherapy;Moist Heat;Therapeutic activities;Therapeutic exercise;Patient/family education;Manual techniques;Dry needling    PT Next Visit Plan  DC today all LTGs met    Consulted and Agree with Plan of Care  Patient       Patient will benefit from skilled therapeutic intervention in order to improve the following deficits and impairments:     Visit Diagnosis: Chronic right-sided low back pain without sciatica     Problem List Patient Active Problem List   Diagnosis Date Noted  . Other  dysphagia 12/31/2017  . CHF (congestive heart failure) ([place]) 11/23/2017  . Plantar fasciitis 04/09/2017  . Achilles tendinitis of left lower extremity 04/09/2017  . Chronic gout of right ankle 02/12/2017  . Overweight (BMI 25.0-29.9) 05/21/2016  . Gastroesophageal reflux disease 10/17/2015  . Metabolic syndrome 35/36/1443  . Joint swelling 04/25/2015  . Vitamin D deficiency 04/06/2015  . Hypothyroidism 04/06/2015  . PALPITATIONS 12/21/2009  . Hyperlipidemia 12/04/2008  . Essential hypertension 12/04/2008  . Coronary atherosclerosis 12/04/2008  . SINUS BRADYCARDIA 12/04/2008    Rhealynn Myhre,CHRIS, PTA 11/25/2018, 2:02 PM  [facility] [place], [place], [address] Phone: [phone]   Fax:  [phone]  Name: Kevin Flores MRN: [id number] Date of Birth: 1941/08/14  PHYSICAL THERAPY DISCHARGE SUMMARY  Visits from Start of Care: 6.  Current functional level related to goals / functional outcomes: See above.   Remaining deficits: All goals met.   Education / Equipment: HEP. Plan: Patient agrees to discharge.  Patient goals were met. Patient is being discharged due to meeting the stated rehab goals.  ?????         Mali Applegate MPT

## 2018-12-04 ENCOUNTER — Telehealth: Payer: Self-pay | Admitting: *Deleted

## 2018-12-04 NOTE — Telephone Encounter (Signed)
   Primary Cardiologist:  Rollene Rotunda, MD   Patient contacted.  History reviewed.  No symptoms to suggest any unstable cardiac conditions.  Based on discussion, with current pandemic situation, we will be postponing this appointment for Kevin Flores with a plan for f/u in 12 wks or sooner if feasible/necessary.  If symptoms change, he has been instructed to contact our office.   Routing to C19 CANCEL pool for tracking (P CV DIV CV19 CANCEL - reason for visit "other.") and assigning priority (1 = 4-6 wks, 2 = 6-12 wks, 3 = >12 wks).   Rocco Serene, RN  12/04/2018 5:55 PM         .

## 2018-12-10 ENCOUNTER — Ambulatory Visit: Payer: Medicare Other | Admitting: Cardiology

## 2019-02-03 ENCOUNTER — Other Ambulatory Visit: Payer: Self-pay | Admitting: Cardiology

## 2019-02-03 ENCOUNTER — Other Ambulatory Visit: Payer: Self-pay | Admitting: Family

## 2019-03-06 ENCOUNTER — Other Ambulatory Visit: Payer: Self-pay | Admitting: Family

## 2019-03-09 ENCOUNTER — Telehealth: Payer: Self-pay | Admitting: Family

## 2019-03-09 MED ORDER — AMLODIPINE BESYLATE 10 MG PO TABS: 10.0000 mg | ORAL_TABLET | Freq: Every day | ORAL | 0 refills | Status: DC

## 2019-03-09 MED ORDER — ALLOPURINOL 300 MG PO TABS: 300.0000 mg | ORAL_TABLET | Freq: Every day | ORAL | 0 refills | Status: DC

## 2019-03-09 NOTE — Telephone Encounter (Signed)
Aware NTBS

## 2019-03-09 NOTE — Telephone Encounter (Signed)
[place] NTBS 30 days given 02/04/19

## 2019-03-09 NOTE — Telephone Encounter (Signed)
Medications sent - wife aware per dpr.

## 2019-03-09 NOTE — Telephone Encounter (Signed)
Yes go ahead and call in 1 month of each

## 2019-03-10 ENCOUNTER — Other Ambulatory Visit: Payer: Self-pay | Admitting: Family

## 2019-03-12 ENCOUNTER — Other Ambulatory Visit: Payer: Self-pay

## 2019-03-12 ENCOUNTER — Encounter: Payer: Self-pay | Admitting: Family

## 2019-03-12 ENCOUNTER — Ambulatory Visit (INDEPENDENT_AMBULATORY_CARE_PROVIDER_SITE_OTHER): Payer: Medicare Other | Admitting: Family

## 2019-03-12 DIAGNOSIS — I10 Essential (primary) hypertension: Secondary | ICD-10-CM

## 2019-03-12 DIAGNOSIS — E785 Hyperlipidemia, unspecified: Secondary | ICD-10-CM

## 2019-03-12 DIAGNOSIS — E8881 Metabolic syndrome: Secondary | ICD-10-CM | POA: Diagnosis not present

## 2019-03-12 DIAGNOSIS — I509 Heart failure, unspecified: Secondary | ICD-10-CM | POA: Diagnosis not present

## 2019-03-12 DIAGNOSIS — K219 Gastro-esophageal reflux disease without esophagitis: Secondary | ICD-10-CM

## 2019-03-12 DIAGNOSIS — I2510 Atherosclerotic heart disease of native coronary artery without angina pectoris: Secondary | ICD-10-CM | POA: Diagnosis not present

## 2019-03-12 DIAGNOSIS — E039 Hypothyroidism, unspecified: Secondary | ICD-10-CM

## 2019-03-12 DIAGNOSIS — E663 Overweight: Secondary | ICD-10-CM | POA: Diagnosis not present

## 2019-03-12 DIAGNOSIS — R05 Cough: Secondary | ICD-10-CM | POA: Diagnosis not present

## 2019-03-12 DIAGNOSIS — R059 Cough, unspecified: Secondary | ICD-10-CM

## 2019-03-12 MED ORDER — FUROSEMIDE 20 MG PO TABS: 20.0000 mg | ORAL_TABLET | ORAL | 1 refills | Status: DC

## 2019-03-12 MED ORDER — PANTOPRAZOLE SODIUM 40 MG PO TBEC: 40.0000 mg | DELAYED_RELEASE_TABLET | Freq: Every day | ORAL | 2 refills | Status: DC

## 2019-03-12 MED ORDER — ATORVASTATIN CALCIUM 20 MG PO TABS: ORAL_TABLET | ORAL | 1 refills | Status: DC

## 2019-03-12 MED ORDER — METOPROLOL SUCCINATE ER 25 MG PO TB24: 25.0000 mg | ORAL_TABLET | Freq: Every day | ORAL | 4 refills | Status: DC

## 2019-03-12 MED ORDER — ALLOPURINOL 300 MG PO TABS: 300.0000 mg | ORAL_TABLET | Freq: Every day | ORAL | 2 refills | Status: DC

## 2019-03-12 MED ORDER — ALBUTEROL SULFATE HFA 108 (90 BASE) MCG/ACT IN AERS: 2.0000 | INHALATION_SPRAY | Freq: Four times a day (QID) | RESPIRATORY_TRACT | 2 refills | Status: DC | PRN

## 2019-03-12 MED ORDER — AMLODIPINE BESYLATE 10 MG PO TABS: 10.0000 mg | ORAL_TABLET | Freq: Every day | ORAL | 1 refills | Status: DC

## 2019-03-12 MED ORDER — LEVOTHYROXINE SODIUM 50 MCG PO TABS: 50.0000 ug | ORAL_TABLET | Freq: Every day | ORAL | 2 refills | Status: DC

## 2019-03-12 NOTE — Progress Notes (Signed)
Virtual Visit via telephone Note  I connected with Kevin Flores on 03/12/19 at 10:30 AM by telephone and verified that I am speaking with the correct person using two identifiers. Kevin Flores is currently located at work and no one is currently with her during visit. The provider, Evelina Dun, FNP is located in their office at time of visit.  I discussed the limitations, risks, security and privacy concerns of performing an evaluation and management service by telephone and the availability of in person appointments. I also discussed with the patient that there may be a patient responsible charge related to this service. The patient expressed understanding and agreed to proceed.   History and Present Illness:  Pt presents to the office today for chronic follow up. He is followed by a Cardiologists every 6 months  For CHF and CAD. He is followed by Nephrologists every 6 months.  Hypertension This is a chronic problem. The current episode started more than 1 year ago. The problem has been resolved (125/75) since onset. The problem is controlled. Associated symptoms include peripheral edema. Pertinent negatives include no headaches or shortness of breath. Risk factors for coronary artery disease include male gender and dyslipidemia. The current treatment provides moderate improvement. Hypertensive end-organ damage includes heart failure. Identifiable causes of hypertension include a thyroid problem.  Hyperlipidemia This is a chronic problem. The current episode started more than 1 year ago. The problem is controlled. Recent lipid tests were reviewed and are normal. Exacerbating diseases include obesity. Pertinent negatives include no shortness of breath. Current antihyperlipidemic treatment includes statins. Risk factors for coronary artery disease include dyslipidemia, male sex and hypertension.  Gastroesophageal Reflux He complains of belching, coughing and heartburn. This is a chronic problem.  The current episode started more than 1 year ago. The problem occurs occasionally. The problem has been waxing and waning. Pertinent negatives include no fatigue. He has tried a PPI for the symptoms. The treatment provided moderate relief.  Thyroid Problem Presents for follow-up visit. Patient reports no constipation, depressed mood, diarrhea or fatigue. The symptoms have been stable. His past medical history is significant for heart failure and hyperlipidemia.      Review of Systems  Constitutional: Negative for fatigue.  Respiratory: Positive for cough. Negative for shortness of breath.   Gastrointestinal: Positive for heartburn. Negative for constipation and diarrhea.  Neurological: Negative for headaches.  All other systems reviewed and are negative.    Observations/Objective: No SOB or distress noted  Assessment and Plan: Kevin Flores comes in today with chief complaint of No chief complaint on file.   Diagnosis and orders addressed:  1. Essential hypertension - amLODipine (NORVASC) 10 MG tablet; Take 1 tablet (10 mg total) by mouth daily.  Dispense: 90 tablet; Refill: 1 - metoprolol succinate (TOPROL XL) 25 MG 24 hr tablet; Take 1 tablet (25 mg total) by mouth daily.  Dispense: 90 tablet; Refill: 4 - CMP14+EGFR; Future - CBC with Differential/Platelet; Future  2. Atherosclerosis of native coronary artery of native heart without angina pectoris - atorvastatin (LIPITOR) 20 MG tablet; TAKE 1 TABLET BY MOUTH ONCE DAILY AT 6 PM  Dispense: 90 tablet; Refill: 1 - furosemide (LASIX) 20 MG tablet; Take 1 tablet (20 mg total) by mouth every other day.  Dispense: 90 tablet; Refill: 1 - CMP14+EGFR; Future - CBC with Differential/Platelet; Future - Lipid panel; Future  3. Congestive heart failure, unspecified HF chronicity, unspecified heart failure type (HCC) - furosemide (LASIX) 20 MG tablet; Take 1 tablet (20 mg  total) by mouth every other day.  Dispense: 90 tablet; Refill: 1 -  CMP14+EGFR; Future - CBC with Differential/Platelet; Future  4. Hypothyroidism, unspecified type - levothyroxine (SYNTHROID) 50 MCG tablet; Take 1 tablet (50 mcg total) by mouth daily before breakfast. TAKE 1 TABLET BY MOUTH IN THE MORNING  Dispense: 90 tablet; Refill: 2 - CMP14+EGFR; Future - CBC with Differential/Platelet; Future - TSH; Future  5. Overweight (BMI 25.0-29.9) - CMP14+EGFR; Future - CBC with Differential/Platelet; Future  6. Metabolic syndrome - atorvastatin (LIPITOR) 20 MG tablet; TAKE 1 TABLET BY MOUTH ONCE DAILY AT 6 PM  Dispense: 90 tablet; Refill: 1 - CMP14+EGFR; Future - CBC with Differential/Platelet; Future  7. Hyperlipidemia, unspecified hyperlipidemia type - atorvastatin (LIPITOR) 20 MG tablet; TAKE 1 TABLET BY MOUTH ONCE DAILY AT 6 PM  Dispense: 90 tablet; Refill: 1 - CMP14+EGFR; Future - CBC with Differential/Platelet; Future  8. Gastroesophageal reflux disease, esophagitis presence not specified - pantoprazole (PROTONIX) 40 MG tablet; Take 1 tablet (40 mg total) by mouth daily.  Dispense: 90 tablet; Refill: 2 - CMP14+EGFR; Future - CBC with Differential/Platelet; Future   Labs pending Health Maintenance reviewed Diet and exercise encouraged  Follow up plan: 6 months and keep Cardiologists and Nephrologists appts       I discussed the assessment and treatment plan with the patient. The patient was provided an opportunity to ask questions and all were answered. The patient agreed with the plan and demonstrated an understanding of the instructions.   The patient was advised to call back or seek an in-person evaluation if the symptoms worsen or if the condition fails to improve as anticipated.  The above assessment and management plan was discussed with the patient. The patient verbalized understanding of and has agreed to the management plan. Patient is aware to call the clinic if symptoms persist or worsen. Patient is aware when to return to the  clinic for a follow-up visit. Patient educated on when it is appropriate to go to the emergency department.   Time call ended:  10:45 AM  I provided 15 minutes of non-face-to-face time during this encounter.    Evelina Dun, FNP

## 2019-03-17 ENCOUNTER — Other Ambulatory Visit: Payer: Self-pay

## 2019-03-17 ENCOUNTER — Other Ambulatory Visit: Payer: Medicare Other

## 2019-03-17 DIAGNOSIS — I509 Heart failure, unspecified: Secondary | ICD-10-CM

## 2019-03-17 DIAGNOSIS — E8881 Metabolic syndrome: Secondary | ICD-10-CM

## 2019-03-17 DIAGNOSIS — E785 Hyperlipidemia, unspecified: Secondary | ICD-10-CM

## 2019-03-17 DIAGNOSIS — E039 Hypothyroidism, unspecified: Secondary | ICD-10-CM | POA: Diagnosis not present

## 2019-03-17 DIAGNOSIS — E663 Overweight: Secondary | ICD-10-CM

## 2019-03-17 DIAGNOSIS — I10 Essential (primary) hypertension: Secondary | ICD-10-CM | POA: Diagnosis not present

## 2019-03-17 DIAGNOSIS — I2510 Atherosclerotic heart disease of native coronary artery without angina pectoris: Secondary | ICD-10-CM | POA: Diagnosis not present

## 2019-03-17 DIAGNOSIS — K219 Gastro-esophageal reflux disease without esophagitis: Secondary | ICD-10-CM | POA: Diagnosis not present

## 2019-03-18 ENCOUNTER — Telehealth: Payer: Self-pay | Admitting: Family

## 2019-03-18 LAB — LIPID PANEL
Chol/HDL Ratio: 3.5 ratio (ref 0.0–5.0)
Cholesterol, Total: 126 mg/dL (ref 100–199)
HDL: 36 mg/dL — ABNORMAL LOW (ref >39)
LDL Calculated: 73 mg/dL (ref 0–99)
Triglycerides: 85 mg/dL (ref 0–149)
VLDL Cholesterol Cal: 17 mg/dL (ref 5–40)

## 2019-03-18 LAB — CBC WITH DIFFERENTIAL/PLATELET
Basophils Absolute: 0.1 10*3/uL (ref 0.0–0.2)
Basos: 1 %
EOS (ABSOLUTE): 0.2 10*3/uL (ref 0.0–0.4)
Eos: 2 %
Hematocrit: 45.4 % (ref 37.5–51.0)
Hemoglobin: 15.5 g/dL (ref 13.0–17.7)
Immature Grans (Abs): 0 10*3/uL (ref 0.0–0.1)
Immature Granulocytes: 0 %
Lymphocytes Absolute: 2 10*3/uL (ref 0.7–3.1)
Lymphs: 19 %
MCH: 29.4 pg (ref 26.6–33.0)
MCHC: 34.1 g/dL (ref 31.5–35.7)
MCV: 86 fL (ref 79–97)
Monocytes Absolute: 1 10*3/uL — ABNORMAL HIGH (ref 0.1–0.9)
Monocytes: 9 %
Neutrophils Absolute: 7.2 10*3/uL — ABNORMAL HIGH (ref 1.4–7.0)
Neutrophils: 69 %
Platelets: 154 10*3/uL (ref 150–450)
RBC: 5.27 x10E6/uL (ref 4.14–5.80)
RDW: 13.1 % (ref 11.6–15.4)
WBC: 10.4 10*3/uL (ref 3.4–10.8)

## 2019-03-18 LAB — CMP14+EGFR
ALT: 15 IU/L (ref 0–44)
AST: 18 IU/L (ref 0–40)
Albumin/Globulin Ratio: 1.4 (ref 1.2–2.2)
Albumin: 4.2 g/dL (ref 3.7–4.7)
Alkaline Phosphatase: 114 IU/L (ref 39–117)
BUN/Creatinine Ratio: 11 (ref 10–24)
BUN: 18 mg/dL (ref 8–27)
Bilirubin Total: 0.5 mg/dL (ref 0.0–1.2)
CO2: 22 mmol/L (ref 20–29)
Calcium: 9.7 mg/dL (ref 8.6–10.2)
Chloride: 105 mmol/L (ref 96–106)
Creatinine, Ser: 1.59 mg/dL — ABNORMAL HIGH (ref 0.76–1.27)
GFR calc Af Amer: 48 mL/min/{1.73_m2} — ABNORMAL LOW (ref >59)
GFR calc non Af Amer: 42 mL/min/{1.73_m2} — ABNORMAL LOW (ref >59)
Globulin, Total: 3 g/dL (ref 1.5–4.5)
Glucose: 110 mg/dL — ABNORMAL HIGH (ref 65–99)
Potassium: 4.2 mmol/L (ref 3.5–5.2)
Sodium: 143 mmol/L (ref 134–144)
Total Protein: 7.2 g/dL (ref 6.0–8.5)

## 2019-03-18 LAB — TSH: TSH: 4.79 u[IU]/mL — ABNORMAL HIGH (ref 0.450–4.500)

## 2019-03-18 NOTE — Telephone Encounter (Signed)
Aware, the provider is not at work.  He thought he was to get some medicine for cough and bronchial problems.  Had a televisit for this.

## 2019-03-18 NOTE — Telephone Encounter (Signed)
I do not see anything in her note about this, does he recall what the medication was supposed to be, was it looks to be his albuterol inhaler or something different Caryl Pina, MD [facility] 03/18/2019, 12:30 PM

## 2019-03-19 ENCOUNTER — Other Ambulatory Visit: Payer: Self-pay | Admitting: Family

## 2019-03-19 MED ORDER — LEVOTHYROXINE SODIUM 75 MCG PO TABS: 75.0000 ug | ORAL_TABLET | Freq: Every day | ORAL | 11 refills | Status: DC

## 2019-03-19 MED ORDER — PREDNISONE 10 MG (21) PO TBPK: ORAL_TABLET | ORAL | 0 refills | Status: DC

## 2019-03-19 NOTE — Telephone Encounter (Signed)
Wife aware

## 2019-03-19 NOTE — Telephone Encounter (Signed)
Prednisone Prescription sent to pharmacy   

## 2019-04-01 ENCOUNTER — Ambulatory Visit (INDEPENDENT_AMBULATORY_CARE_PROVIDER_SITE_OTHER): Payer: Medicare Other | Admitting: Urology

## 2019-04-01 DIAGNOSIS — N281 Cyst of kidney, acquired: Secondary | ICD-10-CM | POA: Diagnosis not present

## 2019-04-01 DIAGNOSIS — N200 Calculus of kidney: Secondary | ICD-10-CM

## 2019-04-07 ENCOUNTER — Ambulatory Visit (INDEPENDENT_AMBULATORY_CARE_PROVIDER_SITE_OTHER): Payer: Medicare Other | Admitting: Family Medicine

## 2019-04-07 ENCOUNTER — Other Ambulatory Visit: Payer: Self-pay

## 2019-04-07 ENCOUNTER — Encounter: Payer: Self-pay | Admitting: Family Medicine

## 2019-04-07 VITALS — BP 134/79 | HR 80 | Temp 98.7°F | Ht 69.0 in | Wt 202.0 lb

## 2019-04-07 DIAGNOSIS — R1310 Dysphagia, unspecified: Secondary | ICD-10-CM | POA: Diagnosis not present

## 2019-04-07 DIAGNOSIS — K2100 Gastro-esophageal reflux disease with esophagitis, without bleeding: Secondary | ICD-10-CM

## 2019-04-07 DIAGNOSIS — R1319 Other dysphagia: Secondary | ICD-10-CM

## 2019-04-07 NOTE — Patient Instructions (Signed)

## 2019-04-08 NOTE — Progress Notes (Signed)
Subjective:  Patient ID: Kevin Flores, male    DOB: 10/23/1940, 78 y.o.   MRN: [id number]  Patient Care Team: Sharion Balloon, FNP as PCP - General (Family Medicine) Minus Breeding, MD as PCP - Cardiology (Cardiology)   Chief Complaint:  Gastroesophageal Reflux   HPI: Kevin Flores is a 78 y.o. male presenting on 04/07/2019 for Gastroesophageal Reflux   Pt presents today with ongoing and worsening reflux. Pt has been been on PPI therapy and has been doing fairly well. States he has had a dry cough for over 3 years. States it has been more bothersome over the last few weeks. He states he has been experiencing early satiety, water brash, burning in esophagus, and dysphagia. States this has worsened over the last few weeks. States he feels as if food gets stuck in his throat at times causing increased heartburn. He states the food always passes but does cause some pain. He reports having esophageal strictures in the past and had to have his esophagus stretched. Pt could not recall when this was and records not available in EHR. He denies hemoptysis, melena, hematochezia, weight loss, fever, diaphoresis, fatigue, chest pain, shortness of breath, wheezing, or stridor. No changes in voice.   Gastroesophageal Reflux He complains of abdominal pain, belching, coughing, dysphagia, early satiety, globus sensation, heartburn and water brash. He reports no chest pain, no choking, no hoarse voice, no nausea, no sore throat, no stridor, no tooth decay or no wheezing. This is a chronic problem. The current episode started more than 1 year ago. The problem occurs frequently. The problem has been gradually worsening. The heartburn is located in the substernum and abdomen. The heartburn is of moderate intensity. The heartburn does not wake him from sleep. The heartburn does not limit his activity. The symptoms are aggravated by certain foods and lying down. Pertinent negatives include no anemia, fatigue, melena,  muscle weakness, orthopnea or weight loss. He has tried a PPI for the symptoms. The treatment provided mild relief. Past procedures include an EGD.     Relevant past medical, surgical, family, and social history reviewed and updated as indicated.  Allergies and medications reviewed and updated. Date reviewed: Chart in Epic.   Past Medical History:  Diagnosis Date   Allergy    seasonal   Arthritis    CAD (coronary artery disease)    1998 LIMA to the LAD, SVG to circumflex. DES placed to the PDA 11/2009    Gout    Heartburn    Hyperlipidemia    Hypertension    Nephrolithiasis    left   Personal history of colonic polyps    adenomas   Thyroid disease     Past Surgical History:  Procedure Laterality Date   BIOPSY PROSTATE     cardiac stents  2001   and 20011   COLONOSCOPY W/ POLYPECTOMY  2009   3 small adenomas   CORONARY ARTERY BYPASS GRAFT  1998   2 bypass   cysto with calculus manipulation     UPPER GASTROINTESTINAL ENDOSCOPY      Social History   Socioeconomic History   Marital status: Married    Spouse name: Not on file   Number of children: Not on file   Years of education: Not on file   Highest education level: Not on file  Occupational History   Occupation: retired  Scientist, product/process development strain: Not on file   Food insecurity    Worry: Not  on file    Inability: Not on file   Transportation needs    Medical: Not on file    Non-medical: Not on file  Tobacco Use   Smoking status: Former Smoker    Quit date: 12/19/1971    Years since quitting: 47.3   Smokeless tobacco: Never Used  Substance and Sexual Activity   Alcohol use: No   Drug use: No   Sexual activity: Not on file  Lifestyle   Physical activity    Days per week: Not on file    Minutes per session: Not on file   Stress: Not on file  Relationships   Social connections    Talks on phone: Not on file    Gets together: Not on file    Attends  religious service: Not on file    Active member of club or organization: Not on file    Attends meetings of clubs or organizations: Not on file    Relationship status: Not on file   Intimate partner violence    Fear of current or ex partner: Not on file    Emotionally abused: Not on file    Physically abused: Not on file    Forced sexual activity: Not on file  Other Topics Concern   Not on file  Social History Narrative   Not on file    Outpatient Encounter Medications as of 04/07/2019  Medication Sig   albuterol (VENTOLIN HFA) 108 (90 Base) MCG/ACT inhaler Inhale 2 puffs into the lungs every 6 (six) hours as needed for wheezing or shortness of breath.   allopurinol (ZYLOPRIM) 300 MG tablet Take 1 tablet (300 mg total) by mouth daily.   amLODipine (NORVASC) 10 MG tablet Take 1 tablet (10 mg total) by mouth daily.   aspirin 81 MG chewable tablet Chew 1 tablet (81 mg total) by mouth daily.   atorvastatin (LIPITOR) 20 MG tablet TAKE 1 TABLET BY MOUTH ONCE DAILY AT 6 PM   Cholecalciferol (VITAMIN D-3 PO) Take 1 capsule by mouth daily.   fish oil-omega-3 fatty acids 1000 MG capsule Take 1 g by mouth at bedtime.    fluticasone (FLONASE) 50 MCG/ACT nasal spray Place 2 sprays into both nostrils daily. (Patient taking differently: Place 2 sprays into both nostrils daily as needed for allergies. )   furosemide (LASIX) 20 MG tablet Take 1 tablet (20 mg total) by mouth every other day.   levothyroxine (SYNTHROID) 75 MCG tablet Take 1 tablet (75 mcg total) by mouth daily.   metoprolol succinate (TOPROL XL) 25 MG 24 hr tablet Take 1 tablet (25 mg total) by mouth daily.   pantoprazole (PROTONIX) 40 MG tablet Take 1 tablet (40 mg total) by mouth daily.   vitamin C (ASCORBIC ACID) 500 MG tablet Take 500 mg by mouth daily.   [DISCONTINUED] predniSONE (STERAPRED UNI-PAK 21 TAB) 10 MG (21) TBPK tablet Use as directed   No facility-administered encounter medications on file as of  04/07/2019.     Allergies  Allergen Reactions   Imdur [Isosorbide Dinitrate] Other (See Comments)    Headache, "made my head swim"   Rosuvastatin Other (See Comments)    SEVERE muscle and leg cramps/aches   Sulfonamide Derivatives Rash and Other (See Comments)    Also "made the whites of my eyes turn yellow"    Review of Systems  Constitutional: Negative for activity change, appetite change, chills, diaphoresis, fatigue, fever, unexpected weight change and weight loss.  HENT: Positive for trouble swallowing. Negative for  hoarse voice, sore throat and voice change.   Respiratory: Positive for cough. Negative for choking, chest tightness, shortness of breath, wheezing and stridor.   Cardiovascular: Negative for chest pain, palpitations and leg swelling.  Gastrointestinal: Positive for abdominal pain, dysphagia and heartburn. Negative for abdominal distention, anal bleeding, blood in stool, constipation, diarrhea, melena, nausea, rectal pain and vomiting.  Genitourinary: Negative for decreased urine volume and difficulty urinating.  Musculoskeletal: Negative for arthralgias, back pain, myalgias and muscle weakness.  Skin: Negative for color change and pallor.  Neurological: Negative for dizziness, weakness, light-headedness and headaches.  Psychiatric/Behavioral: Negative for confusion.  All other systems reviewed and are negative.       Objective:  BP 134/79    Pulse 80    Temp 98.7 F (37.1 C)    Ht '5\' 9"'  (1.753 m)    Wt 202 lb (91.6 kg)    BMI 29.83 kg/m    Wt Readings from Last 3 Encounters:  04/07/19 202 lb (91.6 kg)  11/06/18 205 lb (93 kg)  10/09/18 213 lb (96.6 kg)    Physical Exam Vitals signs and nursing note reviewed.  Constitutional:      General: He is not in acute distress.    Appearance: Normal appearance. He is well-developed and well-groomed. He is not ill-appearing, toxic-appearing or diaphoretic.  HENT:     Head: Normocephalic and atraumatic.     Jaw:  There is normal jaw occlusion.     Right Ear: Hearing normal.     Left Ear: Hearing normal.     Nose: Nose normal.     Mouth/Throat:     Lips: Pink.     Mouth: Mucous membranes are moist.     Pharynx: Oropharynx is clear. Uvula midline.  Eyes:     General: Lids are normal.     Extraocular Movements: Extraocular movements intact.     Conjunctiva/sclera: Conjunctivae normal.     Pupils: Pupils are equal, round, and reactive to light.  Neck:     Musculoskeletal: Normal range of motion and neck supple.     Thyroid: No thyroid mass, thyromegaly or thyroid tenderness.     Vascular: No carotid bruit or JVD.     Trachea: Trachea and phonation normal.  Cardiovascular:     Rate and Rhythm: Normal rate and regular rhythm.     Chest Wall: PMI is not displaced.     Pulses: Normal pulses.     Heart sounds: Normal heart sounds. No murmur. No friction rub. No gallop.   Pulmonary:     Effort: Pulmonary effort is normal. No respiratory distress.     Breath sounds: Normal breath sounds. No wheezing.  Abdominal:     General: Bowel sounds are normal. There is no distension or abdominal bruit.     Palpations: Abdomen is soft. There is no hepatomegaly or splenomegaly.     Tenderness: There is no abdominal tenderness. There is no right CVA tenderness or left CVA tenderness.     Hernia: No hernia is present.  Musculoskeletal: Normal range of motion.     Right lower leg: No edema.     Left lower leg: No edema.  Lymphadenopathy:     Cervical: No cervical adenopathy.  Skin:    General: Skin is warm and dry.     Capillary Refill: Capillary refill takes less than 2 seconds.     Coloration: Skin is not cyanotic, jaundiced or pale.     Findings: No rash.  Neurological:  General: No focal deficit present.     Mental Status: He is alert and oriented to person, place, and time.     Cranial Nerves: Cranial nerves are intact.     Sensory: Sensation is intact.     Motor: Motor function is intact.      Coordination: Coordination is intact.     Gait: Gait is intact.     Deep Tendon Reflexes: Reflexes are normal and symmetric.  Psychiatric:        Attention and Perception: Attention and perception normal.        Mood and Affect: Mood and affect normal.        Speech: Speech normal.        Behavior: Behavior normal. Behavior is cooperative.        Thought Content: Thought content normal.        Cognition and Memory: Cognition and memory normal.        Judgment: Judgment normal.     Results for orders placed or performed in visit on 03/17/19  TSH  Result Value Ref Range   TSH 4.790 (H) 0.450 - 4.500 uIU/mL  Lipid panel  Result Value Ref Range   Cholesterol, Total 126 100 - 199 mg/dL   Triglycerides 85 0 - 149 mg/dL   HDL 36 (L) >39 mg/dL   VLDL Cholesterol Cal 17 5 - 40 mg/dL   LDL Calculated 73 0 - 99 mg/dL   Chol/HDL Ratio 3.5 0.0 - 5.0 ratio  CBC with Differential/Platelet  Result Value Ref Range   WBC 10.4 3.4 - 10.8 x10E3/uL   RBC 5.27 4.14 - 5.80 x10E6/uL   Hemoglobin 15.5 13.0 - 17.7 g/dL   Hematocrit 45.4 37.5 - 51.0 %   MCV 86 79 - 97 fL   MCH 29.4 26.6 - 33.0 pg   MCHC 34.1 31.5 - 35.7 g/dL   RDW 13.1 11.6 - 15.4 %   Platelets 154 150 - 450 x10E3/uL   Neutrophils 69 Not Estab. %   Lymphs 19 Not Estab. %   Monocytes 9 Not Estab. %   Eos 2 Not Estab. %   Basos 1 Not Estab. %   Neutrophils Absolute 7.2 (H) 1.4 - 7.0 x10E3/uL   Lymphocytes Absolute 2.0 0.7 - 3.1 x10E3/uL   Monocytes Absolute 1.0 (H) 0.1 - 0.9 x10E3/uL   EOS (ABSOLUTE) 0.2 0.0 - 0.4 x10E3/uL   Basophils Absolute 0.1 0.0 - 0.2 x10E3/uL   Immature Granulocytes 0 Not Estab. %   Immature Grans (Abs) 0.0 0.0 - 0.1 x10E3/uL  CMP14+EGFR  Result Value Ref Range   Glucose 110 (H) 65 - 99 mg/dL   BUN 18 8 - 27 mg/dL   Creatinine, Ser 1.59 (H) 0.76 - 1.27 mg/dL   GFR calc non Af Amer 42 (L) >59 mL/min/1.73   GFR calc Af Amer 48 (L) >59 mL/min/1.73   BUN/Creatinine Ratio 11 10 - 24   Sodium 143 134 -  144 mmol/L   Potassium 4.2 3.5 - 5.2 mmol/L   Chloride 105 96 - 106 mmol/L   CO2 22 20 - 29 mmol/L   Calcium 9.7 8.6 - 10.2 mg/dL   Total Protein 7.2 6.0 - 8.5 g/dL   Albumin 4.2 3.7 - 4.7 g/dL   Globulin, Total 3.0 1.5 - 4.5 g/dL   Albumin/Globulin Ratio 1.4 1.2 - 2.2   Bilirubin Total 0.5 0.0 - 1.2 mg/dL   Alkaline Phosphatase 114 39 - 117 IU/L   AST 18 0 - 40  IU/L   ALT 15 0 - 44 IU/L       Pertinent labs & imaging results that were available during my care of the patient were reviewed by me and considered in my medical decision making.  Assessment & Plan:  Kevin Flores was seen today for gastroesophageal reflux.  Diagnoses and all orders for this visit:  GERD with esophagitis Esophageal dysphagia Due to increasing occurrence and severity of symptoms and associated dysphagia, will place urgent referral to GI. Continue PPI. Avoid triggers. Report any new or worsening symptoms. Follow up with PCP as needed.  -     Ambulatory referral to Gastroenterology     Continue all other maintenance medications.  Follow up plan: Return if symptoms worsen or fail to improve.  Educational handout given for GERD  The above assessment and management plan was discussed with the patient. The patient verbalized understanding of and has agreed to the management plan. Patient is aware to call the clinic if symptoms persist or worsen. Patient is aware when to return to the clinic for a follow-up visit. Patient educated on when it is appropriate to go to the emergency department.   Monia Pouch, FNP-C [facility] [phone]

## 2019-05-07 ENCOUNTER — Ambulatory Visit (INDEPENDENT_AMBULATORY_CARE_PROVIDER_SITE_OTHER): Payer: Medicare Other | Admitting: Internal Medicine

## 2019-05-07 ENCOUNTER — Encounter: Payer: Self-pay | Admitting: Internal Medicine

## 2019-05-07 ENCOUNTER — Other Ambulatory Visit: Payer: Self-pay

## 2019-05-07 VITALS — BP 148/84 | HR 84 | Temp 98.8°F | Ht 68.5 in | Wt 200.4 lb

## 2019-05-07 DIAGNOSIS — Z800 Family history of malignant neoplasm of digestive organs: Secondary | ICD-10-CM

## 2019-05-07 DIAGNOSIS — I2510 Atherosclerotic heart disease of native coronary artery without angina pectoris: Secondary | ICD-10-CM

## 2019-05-07 DIAGNOSIS — Z86010 Personal history of colonic polyps: Secondary | ICD-10-CM | POA: Diagnosis not present

## 2019-05-07 DIAGNOSIS — R1319 Other dysphagia: Secondary | ICD-10-CM

## 2019-05-07 DIAGNOSIS — R053 Chronic cough: Secondary | ICD-10-CM

## 2019-05-07 DIAGNOSIS — R05 Cough: Secondary | ICD-10-CM

## 2019-05-07 DIAGNOSIS — R1310 Dysphagia, unspecified: Secondary | ICD-10-CM

## 2019-05-07 NOTE — Patient Instructions (Addendum)
Normal BMI (Body Mass Index- based on height and weight) is between 23 and 30. Your BMI today is Body mass index is 30.02 kg/m. Marland Kitchen Please consider follow up  regarding your BMI with your Primary Care Provider.    You have been scheduled for an endoscopy. Please follow the written instructions given to you at your visit today. If you use inhalers (even only as needed), please bring them with you on the day of your procedure.  Thank you for allowing [place] to take care of you today,  Anice Paganini, MD

## 2019-05-07 NOTE — Progress Notes (Signed)
Kevin DrownJohnny Flores 78 y.o. 06/08/1941 [id number]  Assessment & Plan:   Encounter Diagnoses  Name Primary?  . Esophageal dysphagia Yes  . Chronic cough   . Hx of adenomatous colonic polyps   . Family history of colon cancer - brother 6060's     Evaluate dysphagia with an EGD and likely will need an esophageal dilation for what I assume is Flores peptic stricture.  Malignancy is in the differential but seems much less likely.  Continue PPI.The risks and benefits as well as alternatives of endoscopic procedure(s) have been discussed and reviewed. All questions answered. The patient agrees to proceed.   He is not inclined colonoscopy is no technically because he had Flores history of colon polyps and had some family history of colon cancer though his brother was in his 1060s Flores colonoscopy makes sense, he did have Flores negative Cologuard in 2019.  He understands that he could have precancerous lesions or even colon cancer but does not want to pursue Flores colonoscopy.  I appreciate the opportunity to care for this patient. CC: Kevin Flores, Kevin A, FNP   Subjective:   Chief Complaint: Dysphagia  HPI The patient is Flores 78 year old white man known to me from previous colonoscopy (2012, negative, 3 adenomas 2009) who is been having dysphagia for several months or sometimes having increasing regurgitation and heartburn.  Was placed on pantoprazole primary care and he thinks it might of helped Flores little bit.  He has had this problem before and recalls having an EGD with dilation many years ago, I think it was by Dr. Dorena CookeyJohn Hayes.  There is no unintentional weight loss.  He also complains of intermittent dry cough and has been told that could be from reflux.  This is been going on for years.  He avoids caffeine.  He is not Flores smoker, that was remote.   Other GI history is as mentioned above.  Flores brother had colon cancer in his 4760s.  The patient had Flores Cologuard that was negative in 2019 and prefers not to have Flores colonoscopy.   Gastrointestinal review of systems is otherwise negative Allergies  Allergen Reactions  . Imdur [Isosorbide Dinitrate] Other (See Comments)    Headache, "made my head swim"  . Rosuvastatin Other (See Comments)    SEVERE muscle and leg cramps/aches  . Sulfonamide Derivatives Rash and Other (See Comments)    Also "made the whites of my eyes turn yellow"   Current Meds  Medication Sig  . albuterol (VENTOLIN HFA) 108 (90 Base) MCG/ACT inhaler Inhale 2 puffs into the lungs every 6 (six) hours as needed for wheezing or shortness of breath.  . allopurinol (ZYLOPRIM) 300 MG tablet Take 1 tablet (300 mg total) by mouth daily.  Marland Kitchen. amLODipine (NORVASC) 10 MG tablet Take 1 tablet (10 mg total) by mouth daily.  Marland Kitchen. aspirin 81 MG chewable tablet Chew 1 tablet (81 mg total) by mouth daily.  Marland Kitchen. atorvastatin (LIPITOR) 20 MG tablet TAKE 1 TABLET BY MOUTH ONCE DAILY AT 6 PM  . Cholecalciferol (VITAMIN D-3 PO) Take 1 capsule by mouth daily.  . fish oil-omega-3 fatty acids 1000 MG capsule Take 1 g by mouth at bedtime.   . fluticasone (FLONASE) 50 MCG/ACT nasal spray Place 2 sprays into both nostrils daily. (Patient taking differently: Place 2 sprays into both nostrils daily as needed for allergies. )  . furosemide (LASIX) 20 MG tablet Take 1 tablet (20 mg total) by mouth every other day.  . levothyroxine (SYNTHROID) 75  MCG tablet Take 1 tablet (75 mcg total) by mouth daily.  . metoprolol succinate (TOPROL XL) 25 MG 24 hr tablet Take 1 tablet (25 mg total) by mouth daily.  . pantoprazole (PROTONIX) 40 MG tablet Take 1 tablet (40 mg total) by mouth daily.  . vitamin C (ASCORBIC ACID) 500 MG tablet Take 500 mg by mouth daily.   Past Medical History:  Diagnosis Date  . Allergy    seasonal  . Arthritis   . CAD (coronary artery disease)    1998 LIMA to the LAD, SVG to circumflex. DES placed to the PDA 11/2009   . GERD with stricture   . Gout   . Heartburn   . Hyperlipidemia   . Hypertension   . Nephrolithiasis     left  . Personal history of colonic polyps    adenomas  . Thyroid disease    Past Surgical History:  Procedure Laterality Date  . BIOPSY PROSTATE    . cardiac stents  2001   and 20011  . COLONOSCOPY W/ POLYPECTOMY  2009   3 small adenomas  . CORONARY ARTERY BYPASS GRAFT  1998   2 bypass  . cysto with calculus manipulation    . UPPER GASTROINTESTINAL ENDOSCOPY     Social History   Social History Narrative   Married to [place]   4 kids + grandchildren and great grand kids   stays very active, he helps his son-in-law low, he works 3 days Flores week at the [employer].  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   Review of Systems All other review of systems appear negative Objective:   Physical Exam @BP  (!) 148/84 (BP Location: Left Arm, Patient Position: Sitting, Cuff Size: Normal)   Pulse 84   Temp 98.8 F (37.1 C) (Oral)   Ht 5' 8.5" (1.74 m)   Wt 200 lb 6 oz (90.9 kg)   BMI 30.02 kg/m @  General:  Well-developed, well-nourished and in no acute distress Eyes:  anicteric. ENT:   Mouth and posterior pharynx free of lesions. + dentures  Neck:   supple w/o thyromegaly or mass.  Lungs: Clear to auscultation bilaterally. Heart:  S1S2, no rubs, murmurs, gallops. Abdomen:  soft, non-tender, no hepatosplenomegaly, hernia, or mass and BS+.  Lymph:  no cervical or supraclavicular adenopathy. Extremities:   no edema, cyanosis or clubbing Skin   no rash. Tanned sun exposed extremeties and sun damage lesions - AK's Neuro:  Flores&O x 3.  Psych:  appropriate mood and  Affect.   Data Reviewed: See HPI

## 2019-05-18 ENCOUNTER — Telehealth: Payer: Self-pay

## 2019-05-18 NOTE — Telephone Encounter (Signed)
Patients wife called back and answered "no" for all the questions.

## 2019-05-18 NOTE — Telephone Encounter (Signed)
Covid-19 screening questions   Do you now or have you had a fever in the last 14 days?  Do you have any respiratory symptoms of shortness of breath or cough now or in the last 14 days?  Do you have any family members or close contacts with diagnosed or suspected Covid-19 in the past 14 days?  Have you been tested for Covid-19 and found to be positive?       

## 2019-05-19 ENCOUNTER — Other Ambulatory Visit: Payer: Self-pay

## 2019-05-19 ENCOUNTER — Ambulatory Visit (AMBULATORY_SURGERY_CENTER): Payer: Medicare Other | Admitting: Internal Medicine

## 2019-05-19 ENCOUNTER — Encounter: Payer: Self-pay | Admitting: Internal Medicine

## 2019-05-19 VITALS — BP 120/77 | HR 68 | Temp 97.4°F | Resp 18 | Ht 68.0 in | Wt 200.0 lb

## 2019-05-19 DIAGNOSIS — I10 Essential (primary) hypertension: Secondary | ICD-10-CM | POA: Diagnosis not present

## 2019-05-19 DIAGNOSIS — I2510 Atherosclerotic heart disease of native coronary artery without angina pectoris: Secondary | ICD-10-CM | POA: Diagnosis not present

## 2019-05-19 DIAGNOSIS — R05 Cough: Secondary | ICD-10-CM | POA: Diagnosis not present

## 2019-05-19 DIAGNOSIS — R1319 Other dysphagia: Secondary | ICD-10-CM

## 2019-05-19 DIAGNOSIS — E785 Hyperlipidemia, unspecified: Secondary | ICD-10-CM | POA: Diagnosis not present

## 2019-05-19 DIAGNOSIS — R1310 Dysphagia, unspecified: Secondary | ICD-10-CM | POA: Diagnosis not present

## 2019-05-19 DIAGNOSIS — K219 Gastro-esophageal reflux disease without esophagitis: Secondary | ICD-10-CM | POA: Diagnosis not present

## 2019-05-19 MED ORDER — SODIUM CHLORIDE 0.9 % IV SOLN: 500.0000 mL | Freq: Once | INTRAVENOUS | Status: DC

## 2019-05-19 NOTE — Patient Instructions (Addendum)
I stretched the esophagus today - dilated it to 18 mm.  No scar tissue or narrowed area but since having swallowing problems this is what we usually do.  I do think you could be having some spasms of the esophagus and acid reflux can trigger that so keeping heartburn and reflux ender control should help. So stay on the [pantoprazole.  If you continue to have problems despite the dilation today please make an appointment to see me and we can see what else might be done.  One thing for sure is to cut food small, chew well and do not rush eating or talk while swallowing food.   Follow an antireflux regimen.  This includes:      - Do not lie down for at least 3 to 4 hours after meals.       - Raise the head of the bed 4 to 6 inches.       - Decrease excess weight.       - Avoid citrus juices and other acidic foods, alcohol, chocolate, mints, coffee and other caffeinated beverages, carbonated beverages, fatty and fried foods.       - Avoid tight-fitting clothing.       - Avoid cigarettes and other tobacco products.     I appreciate the opportunity to care for you. Iva Booparl E. Maysel Mccolm, MD, [facility]   Handout for Post Dilation Diet given.   YOU HAD AN ENDOSCOPIC PROCEDURE TODAY AT THE [facility]:   Refer to the procedure report that was given to you for any specific questions about what was found during the examination.  If the procedure report does not answer your questions, please call your gastroenterologist to clarify.  If you requested that your care partner not be given the details of your procedure findings, then the procedure report has been included in a sealed envelope for you to review at your convenience later.  YOU SHOULD EXPECT: Some feelings of bloating in the abdomen. Passage of more gas than usual.  Walking can help get rid of the air that was put into your GI tract during the procedure and reduce the bloating. If you had a lower endoscopy (such as a colonoscopy or  flexible sigmoidoscopy) you may notice spotting of blood in your stool or on the toilet paper. If you underwent a bowel prep for your procedure, you may not have a normal bowel movement for a few days.  Please Note:  You might notice some irritation and congestion in your nose or some drainage.  This is from the oxygen used during your procedure.  There is no need for concern and it should clear up in a day or so.  SYMPTOMS TO REPORT IMMEDIATELY:   Following upper endoscopy (EGD)  Vomiting of blood or coffee ground material  New chest pain or pain under the shoulder blades  Painful or persistently difficult swallowing  New shortness of breath  Fever of 100F or higher  Black, tarry-looking stools  For urgent or emergent issues, a gastroenterologist can be reached at any hour by calling [phone].   DIET:  SEE POST DILATION DIET HANDOUT.  CLEAR LIQUIDS UNTIL 1130 AM THEN SOFT DIET FOR THE REST OF TODAY.   TOMORROW  you may proceed to your regular diet.  Drink plenty of fluids but you should avoid alcoholic beverages for 24 hours.  ACTIVITY:  You should plan to take it easy for the rest of today and you should NOT DRIVE or  use heavy machinery until tomorrow (because of the sedation medicines used during the test).    FOLLOW UP: Our staff will call the number listed on your records 48-72 hours following your procedure to check on you and address any questions or concerns that you may have regarding the information given to you following your procedure. If we do not reach you, we will leave a message.  We will attempt to reach you two times.  During this call, we will ask if you have developed any symptoms of COVID 19. If you develop any symptoms (ie: fever, flu-like symptoms, shortness of breath, cough etc.) before then, please call [phone].  If you test positive for Covid 19 in the 2 weeks post procedure, please call and report this information to [place].    If any biopsies were taken  you will be contacted by phone or by letter within the next 1-3 weeks.  Please call us at [phone] if you have not heard about the biopsies in 3 weeks.    SIGNATURES/CONFIDENTIALITY: You and/or your care partner have signed paperwork which will be entered into your electronic medical record.  These signatures attest to the fact that that the information above on your After Visit Summary has been reviewed and is understood.  Full responsibility of the confidentiality of this discharge information lies with you and/or your care-partner.

## 2019-05-19 NOTE — Op Note (Signed)
[facility] Patient Name: Kevin Flores Procedure Date: 05/19/2019 9:46 AM MRN: [id number] Endoscopist: Iva Boop , MD Age: 77 Referring MD:  Date of Birth: [birth year] Gender: Male Account #: [id number] Procedure:                Upper GI endoscopy Indications:              Dysphagia Medicines:                Propofol per Anesthesia Procedure:                Pre-Anesthesia Assessment:                           - Prior to the procedure, a History and Physical                            was performed, and patient medications and                            allergies were reviewed. The patient's tolerance of                            previous anesthesia was also reviewed. The risks                            and benefits of the procedure and the sedation                            options and risks were discussed with the patient.                            All questions were answered, and informed consent                            was obtained. Prior Anticoagulants: The patient has                            taken no previous anticoagulant or antiplatelet                            agents. ASA Grade Assessment: II - A patient with                            mild systemic disease. After reviewing the risks                            and benefits, the patient was deemed in                            satisfactory condition to undergo the procedure.                           After obtaining informed consent, the endoscope was  passed under direct vision. Throughout the                            procedure, the patient's blood pressure, pulse, and                            oxygen saturations were monitored continuously. The                            Endoscope was introduced through the mouth, and                            advanced to the second part of duodenum. The upper                            GI endoscopy was accomplished without difficulty.                            The patient tolerated the procedure well. Scope In: Scope Out: Findings:                 The examined esophagus was moderately tortuous. The                            scope was withdrawn. Dilation was performed with a                            Maloney dilator with no resistance at 54 Fr. The                            dilation site was examined following endoscope                            reinsertion and showed no change. Estimated blood                            loss: none.                           Diffuse mildly erythematous mucosa without bleeding                            was found in the gastric antrum.                           The exam was otherwise without abnormality.                           The cardia and gastric fundus were normal on                            retroflexion. Complications:            No immediate complications. Estimated Blood Loss:     Estimated blood loss: none. Impression:               -  Tortuous esophagus.                           - Erythematous mucosa in the antrum.                           - The examination was otherwise normal.                           - No specimens collected. Recommendation:           - Patient has a contact number available for                            emergencies. The signs and symptoms of potential                            delayed complications were discussed with the                            patient. Return to normal activities tomorrow.                            Written discharge instructions were provided to the                            patient.                           - Clear liquids x 1 hour then soft foods rest of                            day. Start prior diet tomorrow.                           - Continue present medications. stay on pantoprazole                           - Follow an antireflux regimen.                           - IF STILL HAVING SWALLOWING PROBLEMS AFTER  THIS                            AND BEING SURE TO CHEW WELL AND EAT SLOWLY HE                            SHOULD RETURN TO SEE ME Gatha Mayer, MD 05/19/2019 10:13:48 AM This report has been signed electronically.

## 2019-05-19 NOTE — Progress Notes (Signed)
Report to PACU, RN, vss, BBS= Clear.  

## 2019-05-19 NOTE — Progress Notes (Signed)
Pt's states no medical or surgical changes since previsit or office visit.  TEMP  AM [place]

## 2019-05-19 NOTE — Progress Notes (Signed)
AM- Temp CW- Vitals 

## 2019-05-19 NOTE — Progress Notes (Signed)
Called to room to assist during endoscopic procedure.  Patient ID and intended procedure confirmed with present staff. Received instructions for my participation in the procedure from the performing physician.  

## 2019-05-21 ENCOUNTER — Telehealth: Payer: Self-pay

## 2019-05-21 NOTE — Telephone Encounter (Signed)
  Follow up Call-  Call back number 05/19/2019  Post procedure Call Back phone  # [phone]  Permission to leave phone message Yes  Some recent data might be hidden     Patient questions:  Do you have a fever, pain , or abdominal swelling? No. Pain Score  0 *  Have you tolerated food without any problems? Yes.    Have you been able to return to your normal activities? Yes.    Do you have any questions about your discharge instructions: Diet   No. Medications  No. Follow up visit  No.  Do you have questions or concerns about your Care? No.  Actions: * If pain score is 4 or above: No action needed, pain <4.  1. Have you developed a fever since your procedure? No.  2.   Have you had an respiratory symptoms (SOB or cough) since your procedure? No.  3.   Have you tested positive for COVID 19 since your procedure no.  4.   Have you had any family members/close contacts diagnosed with the COVID 19 since your procedure?  No.   If yes to any of these questions please route to Joylene John, RN and Alphonsa Gin, RN.

## 2019-05-25 NOTE — Progress Notes (Signed)
Cardiology Office Note   Date:  05/27/2019   ID:  Kevin DrownJohnny Morr, DOB 10/19/1940, MRN [id number]  PCP:  [name], Christy A, FNP  Cardiologist:   Rollene RotundaJames Sahvanna Mcmanigal, MD   Chief Complaint  Patient presents with  . Congestive Heart Failure      History of Present Illness: Kevin Flores is a 78 y.o. male who presents follow up of CAD.   He has a history of CAD s/p CABG 1998, DES RCA 2001, DES PDA 2011 (LIMA-LAD & SVG-OM ok).   He was admitted 3/15-3/18/2019 for bradycardia and CHF exacerbation, weight at discharge 198 pounds.  His creatinine was 2.05 at discharge and  1.68 at recheck 3/22.  Myoview was intermediate risk but because of high risk of AKI, he was managed medically.  I saw him last year and he returns for follow up.    He has had some chronic cough which he wondered if this could be associated with medications.  Otherwise he is doing quite well.  He is golfing and working in his yard. The patient denies any new symptoms such as chest discomfort, neck or arm discomfort. There has been no new shortness of breath, PND or orthopnea. There have been no reported palpitations, presyncope or syncope.    Past Medical History:  Diagnosis Date  . Allergy    seasonal  . Arthritis   . CAD (coronary artery disease)    1998 LIMA to the LAD, SVG to circumflex. DES placed to the PDA 11/2009   . GERD with stricture   . Gout   . Heartburn   . Hyperlipidemia   . Hypertension   . Nephrolithiasis    left  . Personal history of colonic polyps    adenomas  . Thyroid disease     Past Surgical History:  Procedure Laterality Date  . BIOPSY PROSTATE    . cardiac stents  2001   and [id number]  . COLONOSCOPY W/ POLYPECTOMY  2009   3 small adenomas  . CORONARY ARTERY BYPASS GRAFT  1998   2 bypass  . cysto with calculus manipulation    . UPPER GASTROINTESTINAL ENDOSCOPY       Current Outpatient Medications  Medication Sig Dispense Refill  . albuterol (VENTOLIN HFA) 108 (90 Base) MCG/ACT inhaler  Inhale 2 puffs into the lungs every 6 (six) hours as needed for wheezing or shortness of breath. 8 g 2  . allopurinol (ZYLOPRIM) 300 MG tablet Take 1 tablet (300 mg total) by mouth daily. 90 tablet 2  . amLODipine (NORVASC) 10 MG tablet Take 1 tablet (10 mg total) by mouth daily. 90 tablet 1  . aspirin 81 MG chewable tablet Chew 1 tablet (81 mg total) by mouth daily. 90 tablet 3  . atorvastatin (LIPITOR) 20 MG tablet TAKE 1 TABLET BY MOUTH ONCE DAILY AT 6 PM 90 tablet 1  . Cholecalciferol (VITAMIN D-3 PO) Take 1 capsule by mouth daily.    . fish oil-omega-3 fatty acids 1000 MG capsule Take 1 g by mouth at bedtime.     . fluticasone (FLONASE) 50 MCG/ACT nasal spray Place 2 sprays into both nostrils daily. (Patient taking differently: Place 2 sprays into both nostrils daily as needed for allergies. ) 16 g 6  . furosemide (LASIX) 20 MG tablet Take 1 tablet (20 mg total) by mouth every other day. 90 tablet 1  . levothyroxine (SYNTHROID) 75 MCG tablet Take 1 tablet (75 mcg total) by mouth daily. 30 tablet 11  . metoprolol  succinate (TOPROL XL) 25 MG 24 hr tablet Take 1 tablet (25 mg total) by mouth daily. 90 tablet 4  . pantoprazole (PROTONIX) 40 MG tablet Take 1 tablet (40 mg total) by mouth daily. 90 tablet 2  . vitamin C (ASCORBIC ACID) 500 MG tablet Take 500 mg by mouth daily.     No current facility-administered medications for this visit.     Allergies:   Imdur [isosorbide dinitrate], Rosuvastatin, and Sulfonamide derivatives    ROS:  Please see the history of present illness.   Otherwise, review of systems are positive for cough and arthritis.   All other systems are reviewed and negative.    PHYSICAL EXAM: VS:  BP 120/78   Pulse 75   Ht 5\' 9"  (1.753 m)   Wt 200 lb (90.7 kg)   BMI 29.53 kg/m  , BMI Body mass index is 29.53 kg/m.  GENERAL:  Well appearing NECK:  No jugular venous distention, waveform within normal limits, carotid upstroke brisk and symmetric, no bruits, no  thyromegaly LUNGS:  Clear to auscultation bilaterally CHEST:  Well healed sternotomy scar. HEART:  PMI not displaced or sustained,S1 and S2 within normal limits, no S3, no S4, no clicks, no rubs, no murmurs ABD:  Flat, positive bowel sounds normal in frequency in pitch, no bruits, no rebound, no guarding, no midline pulsatile mass, no hepatomegaly, no splenomegaly EXT:  2 plus pulses throughout, no edema, no cyanosis no clubbing   EKG:  EKG  ordered today. Sinus rhythm, rate 75, left axis, poor anterior no acute ST-T wave changes.   Recent Labs: 03/17/2019: ALT 15; BUN 18; Creatinine, Ser 1.59; Hemoglobin 15.5; Platelets 154; Potassium 4.2; Sodium 143; TSH 4.790    Lipid Panel    Component Value Date/Time   CHOL 126 03/17/2019 0804   TRIG 85 03/17/2019 0804   HDL 36 (L) 03/17/2019 0804   CHOLHDL 3.5 03/17/2019 0804   CHOLHDL 4.7 11/18/2009 0347   VLDL 27 11/18/2009 0347   LDLCALC 73 03/17/2019 0804   LDLDIRECT 77 09/12/2017 1345      Wt Readings from Last 3 Encounters:  05/27/19 200 lb (90.7 kg)  05/19/19 200 lb (90.7 kg)  05/07/19 200 lb 6 oz (90.9 kg)      Other studies Reviewed: Additional studies/ records that were reviewed today include: Labs Review of the above records demonstrates: See below   ASSESSMENT AND PLAN:  CHRONIC DIASTOLIC HF:     He seems to be euvolemic.  No change in therapy.   CAD:  The patient has no new sypmtoms.  No further cardiovascular testing is indicated.  We will continue with aggressive risk reduction and meds as listed.  HTN:  The blood pressure is stable.  He talked about the Norvasc and whether that could be causing cough but he also has chronic bronchitis which I think is more likely.  I looked this up for him and do not see Norvasc is doing this and I do not have clinical experience at all having prescribed this drug for years patients he developed cough.  I think this is unlikely.   PVCs:   He does not notice these.    Current  medicines are reviewed at length with the patient today.  The patient does not have concerns regarding medicines.  The following changes have been made:  None  Labs/ tests ordered today include: None  Orders Placed This Encounter  Procedures  . EKG 12-Lead     Disposition:  FU with me in 12 months.     Signed, Minus Breeding, MD  05/27/2019 10:37 AM    [facility]

## 2019-05-27 ENCOUNTER — Ambulatory Visit (INDEPENDENT_AMBULATORY_CARE_PROVIDER_SITE_OTHER): Payer: Medicare Other | Admitting: Cardiology

## 2019-05-27 ENCOUNTER — Other Ambulatory Visit: Payer: Self-pay

## 2019-05-27 ENCOUNTER — Encounter: Payer: Self-pay | Admitting: Cardiology

## 2019-05-27 VITALS — BP 120/78 | HR 75 | Ht 69.0 in | Wt 200.0 lb

## 2019-05-27 DIAGNOSIS — I10 Essential (primary) hypertension: Secondary | ICD-10-CM | POA: Diagnosis not present

## 2019-05-27 DIAGNOSIS — I493 Ventricular premature depolarization: Secondary | ICD-10-CM | POA: Diagnosis not present

## 2019-05-27 DIAGNOSIS — I2510 Atherosclerotic heart disease of native coronary artery without angina pectoris: Secondary | ICD-10-CM | POA: Diagnosis not present

## 2019-05-27 DIAGNOSIS — I5032 Chronic diastolic (congestive) heart failure: Secondary | ICD-10-CM

## 2019-05-27 NOTE — Patient Instructions (Signed)
Medication Instructions:  The current medical regimen is effective;  continue present plan and medications.  If you need a refill on your cardiac medications before your next appointment, please call your pharmacy.   Follow-Up: Follow up in 1 year with Dr. Hochrein.  You will receive a letter in the mail 2 months before you are due.  Please call us when you receive this letter to schedule your follow up appointment.  Thank you for choosing [facility]!!     

## 2019-06-29 ENCOUNTER — Other Ambulatory Visit: Payer: Self-pay | Admitting: *Deleted

## 2019-06-29 MED ORDER — LEVOTHYROXINE SODIUM 75 MCG PO TABS: 75.0000 ug | ORAL_TABLET | Freq: Every day | ORAL | 2 refills | Status: DC

## 2019-06-30 DIAGNOSIS — Z23 Encounter for immunization: Secondary | ICD-10-CM | POA: Diagnosis not present

## 2019-08-23 ENCOUNTER — Telehealth: Payer: Medicare Other | Admitting: Physician Assistant

## 2019-08-23 DIAGNOSIS — J208 Acute bronchitis due to other specified organisms: Secondary | ICD-10-CM

## 2019-08-23 MED ORDER — PREDNISONE 10 MG PO TABS: 10.0000 mg | ORAL_TABLET | Freq: Every day | ORAL | 0 refills | Status: DC

## 2019-08-23 MED ORDER — BENZONATATE 100 MG PO CAPS: 100.0000 mg | ORAL_CAPSULE | Freq: Three times a day (TID) | ORAL | 0 refills | Status: DC | PRN

## 2019-08-23 NOTE — Progress Notes (Signed)
We are sorry that you are not feeling well.  Here is how we plan to help!  Based on your presentation I believe you most likely have A cough due to a virus.  This is called viral bronchitis and is best treated by rest, plenty of fluids and control of the cough.  You may use Ibuprofen or Tylenol as directed to help your symptoms.     In addition you may use A prescription cough medication called Tessalon Perles 100mg . You may take 1-2 capsules every 8 hours as needed for your cough. I have also sent in Prednisone 10 mg daily for 6 days to help with cough, inflammation and bronchospasm.  From your responses in the eVisit questionnaire you describe inflammation in the upper respiratory tract which is causing a significant cough.  This is commonly called Bronchitis and has four common causes:    Allergies  Viral Infections  Acid Reflux  Bacterial Infection Allergies, viruses and acid reflux are treated by controlling symptoms or eliminating the cause. An example might be a cough caused by taking certain blood pressure medications. You stop the cough by changing the medication. Another example might be a cough caused by acid reflux. Controlling the reflux helps control the cough.  USE OF BRONCHODILATOR ("RESCUE") INHALERS: There is a risk from using your bronchodilator too frequently.  The risk is that over-reliance on a medication which only relaxes the muscles surrounding the breathing tubes can reduce the effectiveness of medications prescribed to reduce swelling and congestion of the tubes themselves.  Although you feel brief relief from the bronchodilator inhaler, your asthma may actually be worsening with the tubes becoming more swollen and filled with mucus.  This can delay other crucial treatments, such as oral steroid medications. If you need to use a bronchodilator inhaler daily, several times per day, you should discuss this with your provider.  There are probably better treatments that  could be used to keep your asthma under control.     HOME CARE . Only take medications as instructed by your medical team. . Complete the entire course of an antibiotic. . Drink plenty of fluids and get plenty of rest. . Avoid close contacts especially the very young and the elderly . Cover your mouth if you cough or cough into your sleeve. . Always remember to wash your hands . A steam or ultrasonic humidifier can help congestion.   GET HELP RIGHT AWAY IF: . You develop worsening fever. . You become short of breath . You cough up blood. . Your symptoms persist after you have completed your treatment plan MAKE SURE YOU   Understand these instructions.  Will watch your condition.  Will get help right away if you are not doing well or get worse.  Your e-visit answers were reviewed by a board certified advanced clinical practitioner to complete your personal care plan.  Depending on the condition, your plan could have included both over the counter or prescription medications. If there is a problem please reply  once you have received a response from your provider. Your safety is important to [place].  If you have drug allergies check your prescription carefully.    You can use MyChart to ask questions about today's visit, request a non-urgent call back, or ask for a work or school excuse for 24 hours related to this e-Visit. If it has been greater than 24 hours you will need to follow up with your provider, or enter a new e-Visit to address  those concerns. You will get an e-mail in the next two days asking about your experience.  I hope that your e-visit has been valuable and will speed your recovery. Thank you for using e-visits.

## 2019-08-23 NOTE — Progress Notes (Signed)
I have spent 5 minutes in review of e-visit questionnaire, review and updating patient chart, medical decision making and response to patient.   Sorayah Schrodt Cody Felcia Huebert, PA-C    

## 2019-09-01 ENCOUNTER — Encounter: Payer: Self-pay | Admitting: Urology

## 2019-09-10 ENCOUNTER — Other Ambulatory Visit: Payer: Self-pay

## 2019-09-10 DIAGNOSIS — N200 Calculus of kidney: Secondary | ICD-10-CM

## 2019-10-07 ENCOUNTER — Other Ambulatory Visit: Payer: Self-pay | Admitting: *Deleted

## 2019-10-07 DIAGNOSIS — E785 Hyperlipidemia, unspecified: Secondary | ICD-10-CM

## 2019-10-07 DIAGNOSIS — E8881 Metabolic syndrome: Secondary | ICD-10-CM

## 2019-10-07 DIAGNOSIS — I2510 Atherosclerotic heart disease of native coronary artery without angina pectoris: Secondary | ICD-10-CM

## 2019-10-07 MED ORDER — ATORVASTATIN CALCIUM 20 MG PO TABS: ORAL_TABLET | ORAL | 0 refills | Status: DC

## 2019-10-08 ENCOUNTER — Ambulatory Visit: Payer: Medicare Other

## 2019-10-17 ENCOUNTER — Ambulatory Visit: Payer: Medicare Other | Attending: Internal Medicine

## 2019-10-17 DIAGNOSIS — Z23 Encounter for immunization: Secondary | ICD-10-CM | POA: Insufficient documentation

## 2019-10-17 NOTE — Progress Notes (Signed)
   Covid-19 Vaccination Clinic  Name:  Jaydenn Boccio    MRN: [id number] DOB: August 28, 1941  10/17/2019  Mr. Wolf was observed post Covid-19 immunization for 15 minutes without incidence. He was provided with Vaccine Information Sheet and instruction to access the V-Safe system.   Mr. Delcarlo was instructed to call 911 with any severe reactions post vaccine: Marland Kitchen Difficulty breathing  . Swelling of your face and throat  . A fast heartbeat  . A bad rash all over your body  . Dizziness and weakness    Immunizations Administered    Name Date Dose VIS Date Route   Pfizer COVID-19 Vaccine 10/17/2019  8:55 AM 0.3 mL 08/21/2019 Intramuscular   Manufacturer: [employer], [employer]   Lot: [id number]   NDC: [address]-1

## 2019-10-27 ENCOUNTER — Ambulatory Visit (HOSPITAL_COMMUNITY)
Admission: RE | Admit: 2019-10-27 | Discharge: 2019-10-27 | Disposition: A | Discharge status: Home / Self Care | Payer: Medicare Other | Source: Ambulatory Visit | Attending: Urology | Admitting: Urology

## 2019-10-27 ENCOUNTER — Other Ambulatory Visit: Payer: Self-pay

## 2019-10-27 DIAGNOSIS — N200 Calculus of kidney: Secondary | ICD-10-CM | POA: Insufficient documentation

## 2019-10-27 DIAGNOSIS — N281 Cyst of kidney, acquired: Secondary | ICD-10-CM | POA: Diagnosis not present

## 2019-10-28 ENCOUNTER — Ambulatory Visit: Payer: Medicare Other | Admitting: Urology

## 2019-10-28 ENCOUNTER — Telehealth (INDEPENDENT_AMBULATORY_CARE_PROVIDER_SITE_OTHER): Payer: Medicare Other | Admitting: Urology

## 2019-10-28 DIAGNOSIS — N281 Cyst of kidney, acquired: Secondary | ICD-10-CM | POA: Insufficient documentation

## 2019-10-28 DIAGNOSIS — N200 Calculus of kidney: Secondary | ICD-10-CM | POA: Insufficient documentation

## 2019-10-28 NOTE — Progress Notes (Signed)
10/28/2019 11:56 AM   Kevin Flores May 16, 1941 [id number]  Referring provider: Junie Spencer, FNP [address] [place],  [place] [address]  Renal cysts and nephrolithiasis  HPI: Kevin Flores is a 78yo here for followup for nephrolithiasis and bilateral renal cysts. No stone events since last visit. No flank pain. No LUTS. He denies any hematuria or dysuria. He underwent renal cyst drainage 1 year ago. No issues   His records from [facility] are as follows: Kevin Flores returns today in f/u. He has a history of a left renal cyst that required aspiration for pain on 09/05/12. He has had no further pain. He has had no more hematuria. He is voiding well. The renal US today showed multiple cysts with the aspirated cyst being stable at 6.79cm He has no associated signs or symptoms.   10/22/2018: The patient was seen in the ER on 1/28 with right back pain and was diagnosed with a muscle strain. He underwent CT on 1/4 and 1/28 which showed bilateral renal cysts and a large upper pole and lower pole left renal cyst. He underwent aspiration 4-5 years ago and did well. no hematuria. Creatinine 1.4   04/01/2019: He underwent left renal cyst drainage on 11/06/2018. no flank pain.      PMH: Past Medical History:  Diagnosis Date  . Allergy    seasonal  . Arthritis   . CAD (coronary artery disease)    1998 LIMA to the LAD, SVG to circumflex. DES placed to the PDA 11/2009   . GERD with stricture   . Gout   . Heartburn   . Hyperlipidemia   . Hypertension   . Nephrolithiasis    left  . Personal history of colonic polyps    adenomas  . Thyroid disease     Surgical History: Past Surgical History:  Procedure Laterality Date  . BIOPSY PROSTATE    . cardiac stents  2001   and 83419  . COLONOSCOPY W/ POLYPECTOMY  2009   3 small adenomas  . CORONARY ARTERY BYPASS GRAFT  1998   2 bypass  . cysto with calculus manipulation    . UPPER GASTROINTESTINAL ENDOSCOPY      Home Medications:  Allergies  as of 10/28/2019      Reactions   Imdur [isosorbide Dinitrate] Other (See Comments)   Headache, "made my head swim"   Rosuvastatin Other (See Comments)   SEVERE muscle and leg cramps/aches   Sulfonamide Derivatives Rash, Other (See Comments)   Also "made the whites of my eyes turn yellow"      Medication List       Accurate as of October 28, 2019 11:56 AM. If you have any questions, ask your nurse or doctor.        albuterol 108 (90 Base) MCG/ACT inhaler Commonly known as: VENTOLIN HFA Inhale 2 puffs into the lungs every 6 (six) hours as needed for wheezing or shortness of breath.   allopurinol 300 MG tablet Commonly known as: ZYLOPRIM Take 1 tablet (300 mg total) by mouth daily.   amLODipine 10 MG tablet Commonly known as: NORVASC Take 1 tablet (10 mg total) by mouth daily.   aspirin 81 MG chewable tablet Chew 1 tablet (81 mg total) by mouth daily.   atorvastatin 20 MG tablet Commonly known as: LIPITOR TAKE 1 TABLET BY MOUTH ONCE DAILY AT 6 PM (Needs to be seen before next refill)   benzonatate 100 MG capsule Commonly known as: TESSALON Take 1 capsule (100  mg total) by mouth 3 (three) times daily as needed for cough.   fish oil-omega-3 fatty acids 1000 MG capsule Take 1 g by mouth at bedtime.   fluticasone 50 MCG/ACT nasal spray Commonly known as: FLONASE Place 2 sprays into both nostrils daily. What changed:   when to take this  reasons to take this   furosemide 20 MG tablet Commonly known as: LASIX Take 1 tablet (20 mg total) by mouth every other day.   levothyroxine 75 MCG tablet Commonly known as: Synthroid Take 1 tablet (75 mcg total) by mouth daily.   metoprolol succinate 25 MG 24 hr tablet Commonly known as: Toprol XL Take 1 tablet (25 mg total) by mouth daily.   pantoprazole 40 MG tablet Commonly known as: PROTONIX Take 1 tablet (40 mg total) by mouth daily.   predniSONE 10 MG tablet Commonly known as: DELTASONE Take 1 tablet (10 mg  total) by mouth daily with breakfast.   vitamin C 500 MG tablet Commonly known as: ASCORBIC ACID Take 500 mg by mouth daily.   VITAMIN D-3 PO Take 1 capsule by mouth daily.       Allergies:  Allergies  Allergen Reactions  . Imdur [Isosorbide Dinitrate] Other (See Comments)    Headache, "made my head swim"  . Rosuvastatin Other (See Comments)    SEVERE muscle and leg cramps/aches  . Sulfonamide Derivatives Rash and Other (See Comments)    Also "made the whites of my eyes turn yellow"    Family History: Family History  Problem Relation Age of Onset  . Colon cancer Brother        52s  . Diabetes Brother   . Heart disease Brother   . Diabetes Father   . Breast cancer Sister   . Esophageal cancer Neg Hx   . Liver cancer Neg Hx   . Pancreatic cancer Neg Hx   . Prostate cancer Neg Hx   . Rectal cancer Neg Hx   . Stomach cancer Neg Hx     Social History:  reports that he quit smoking about 47 years ago. He has never used smokeless tobacco. He reports that he does not drink alcohol or use drugs.  ROS: All other review of systems were reviewed and are negative except what is noted above in HPI  Physical Exam: There were no vitals taken for this visit.  Constitutional:  Alert and oriented, No acute distress. HEENT: [place] AT, moist mucus membranes.  Trachea midline, no masses. Cardiovascular: No clubbing, cyanosis, or edema. Respiratory: Normal respiratory effort, no increased work of breathing. GI: Abdomen is soft, nontender, nondistended, no abdominal masses GU: No CVA tenderness Lymph: No cervical or inguinal lymphadenopathy. Skin: No rashes, bruises or suspicious lesions. Neurologic: Grossly intact, no focal deficits, moving all 4 extremities. Psychiatric: Normal mood and affect.  Laboratory Data: Lab Results  Component Value Date   WBC 10.4 03/17/2019   HGB 15.5 03/17/2019   HCT 45.4 03/17/2019   MCV 86 03/17/2019   PLT 154 03/17/2019    Lab Results    Component Value Date   CREATININE 1.59 (H) 03/17/2019    No results found for: PSA  No results found for: TESTOSTERONE  No results found for: HGBA1C  Urinalysis    Component Value Date/Time   COLORURINE YELLOW 10/07/2018 1007   APPEARANCEUR CLEAR 10/07/2018 1007   LABSPEC 1.015 10/07/2018 1007   PHURINE 6.0 10/07/2018 [place] 10/07/2018 [place] 10/07/2018 1007  BILIRUBINUR NEGATIVE 10/07/2018 1007   KETONESUR NEGATIVE 10/07/2018 1007   PROTEINUR NEGATIVE 10/07/2018 1007   NITRITE NEGATIVE 10/07/2018 1007   LEUKOCYTESUR NEGATIVE 10/07/2018 1007    No results found for: LABMICR, WBCUA, RBCUA, LABEPIT, MUCUS, BACTERIA  Pertinent Imaging: Renal US 10/27/2019: Images reviewed and dsicussed the with patient Results for orders placed during the hospital encounter of 02/23/10  DG Abd 1 View   Narrative Clinical Data: Preop left ureteral stone.   ABDOMEN - 1 VIEW   Comparison: None.   Findings: A 7 mm calcification projects over the right renal outline.  Small calcifications project over the left renal outline. A calcification is seen in the lateral left anatomic pelvis.  There are phleboliths in the right anatomic pelvis.  Stool is seen in the majority of the colon.   IMPRESSION:   1.  Bilateral nephrolithiasis. 2.  Small calcification in the left anatomic pelvis may be slightly lateral to be within the distal left ureter. 3.  Probable constipation.  Provider: Ronalee Belts   No results found for this or any previous visit. No results found for this or any previous visit. No results found for this or any previous visit. Results for orders placed during the hospital encounter of 10/27/19  Ultrasound renal complete   Narrative CLINICAL DATA:  History of kidney stones  EXAM: RENAL / URINARY TRACT ULTRASOUND COMPLETE  COMPARISON:  CT 10/07/2018, ultrasound 03/15/2016  FINDINGS: Right Kidney:  Renal measurements: 14.3 x 7.2 x 6.8  cm = volume: 364 mL . Echogenicity within normal limits. Numerous round anechoic and hypoechoic lesions emanating from the right kidney, largest measuring up to 5.4 cm compatible with known renal cysts seen on prior studies. No discrete solid mass visualized. No discernible shadowing stone. No hydronephrosis.  Left Kidney:  Renal measurements: 13.0 x 5.6 x 5.7 cm = volume: 215 mL. Echogenicity within normal limits. Numerous round anechoic and hypoechoic lesions emanating from the left kidney, largest measuring up to 5.6 cm compatible with known renal cysts seen on prior studies. Additional cyst at the superior pole the left kidney has a thin internal septation. No discrete solid mass visualized. No discernible shadowing stone. No hydronephrosis.  Bladder:  Appears normal for degree of bladder distention.  Other:  None.  IMPRESSION: Numerous bilateral renal cysts. Previously seen bilateral renal calculi were not identified on today's exam. These may have resolved or been partially obscured by the multiple cysts. No hydronephrosis.   Electronically Signed   By: Duanne Guess D.O.   On: 10/27/2019 12:01    No results found for this or any previous visit. No results found for this or any previous visit. Results for orders placed during the hospital encounter of 10/07/18  CT Renal Stone Study   Narrative CLINICAL DATA:  Right flank/low back pain. History of renal stones.  EXAM: CT ABDOMEN AND PELVIS WITHOUT CONTRAST  TECHNIQUE: Multidetector CT imaging of the abdomen and pelvis was performed following the standard protocol without IV contrast.  COMPARISON:  09/13/2018  FINDINGS: Lower chest: Mild atelectasis in the lung bases. Small calcified granuloma in the right lower lobe. Three vessel coronary artery atherosclerosis.  Hepatobiliary: No focal liver abnormality is seen. No gallstones, gallbladder wall thickening, or biliary dilatation.  Pancreas:  Unremarkable.  Spleen: Unremarkable.  Adrenals/Urinary Tract: Unremarkable adrenal glands. Grossly unchanged size of numerous low-density lesions in both kidneys, compatible with cysts though incompletely evaluated on this noncontrast examination. The largest cyst measures 7 cm extending inferiorly from the left  lower pole. Punctate bilateral renal calculi are unchanged. No ureteral calculi or ureteral dilatation is seen. There is no hydronephrosis. The bladder is unremarkable.  Stomach/Bowel: The stomach is unremarkable. There is no evidence of bowel obstruction or inflammation. The appendix is unremarkable.  Vascular/Lymphatic: Abdominal aortic atherosclerosis without aneurysm. No enlarged lymph nodes.  Reproductive: Unremarkable prostate.  Other: No intraperitoneal free fluid.  Musculoskeletal: Thoracolumbar spondylosis. No suspicious osseous lesion.  IMPRESSION: 1. No acute abnormality identified in the abdomen or pelvis. 2. Unchanged bilateral nephrolithiasis. No ureteral calculi or hydronephrosis. 3. Bilateral renal cysts. 4. Aortic Atherosclerosis (ICD10-I70.0).   Electronically Signed   By: Sebastian Ache M.D.   On: 10/07/2018 11:48     Assessment & Plan:    1. Renal cyst -Continue observation, RTC 1 year with renal US  2. Nephrolithiasis -continue observation, RTC 1 year with renal US   Wilkie Aye, MD  [facility] [place]

## 2019-10-28 NOTE — Patient Instructions (Signed)
Dietary Guidelines to Help Prevent Kidney Stones Kidney stones are deposits of minerals and salts that form inside your kidneys. Your risk of developing kidney stones may be greater depending on your diet, your lifestyle, the medicines you take, and whether you have certain medical conditions. Most people can reduce their chances of developing kidney stones by following the instructions below. Depending on your overall health and the type of kidney stones you tend to develop, your dietitian may give you more specific instructions. What are tips for following this plan? Reading food labels  Choose foods with "no salt added" or "low-salt" labels. Limit your sodium intake to less than 1500 mg per day.  Choose foods with calcium for each meal and snack. Try to eat about 300 mg of calcium at each meal. Foods that contain 200-500 mg of calcium per serving include: ? 8 oz (237 ml) of milk, fortified nondairy milk, and fortified fruit juice. ? 8 oz (237 ml) of kefir, yogurt, and soy yogurt. ? 4 oz (118 ml) of tofu. ? 1 oz of cheese. ? 1 cup (300 g) of dried figs. ? 1 cup (91 g) of cooked broccoli. ? 1-3 oz can of sardines or mackerel.  Most people need 1000 to 1500 mg of calcium each day. Talk to your dietitian about how much calcium is recommended for you. Shopping  Buy plenty of fresh fruits and vegetables. Most people do not need to avoid fruits and vegetables, even if they contain nutrients that may contribute to kidney stones.  When shopping for convenience foods, choose: ? Whole pieces of fruit. ? Premade salads with dressing on the side. ? Low-fat fruit and yogurt smoothies.  Avoid buying frozen meals or prepared deli foods.  Look for foods with live cultures, such as yogurt and kefir. Cooking  Do not add salt to food when cooking. Place a salt shaker on the table and allow each person to add his or her own salt to taste.  Use vegetable protein, such as beans, textured vegetable  protein (TVP), or tofu instead of meat in pasta, casseroles, and soups. Meal planning   Eat less salt, if told by your dietitian. To do this: ? Avoid eating processed or premade food. ? Avoid eating fast food.  Eat less animal protein, including cheese, meat, poultry, or fish, if told by your dietitian. To do this: ? Limit the number of times you have meat, poultry, fish, or cheese each week. Eat a diet free of meat at least 2 days a week. ? Eat only one serving each day of meat, poultry, fish, or seafood. ? When you prepare animal protein, cut pieces into small portion sizes. For most meat and fish, one serving is about the size of one deck of cards.  Eat at least 5 servings of fresh fruits and vegetables each day. To do this: ? Keep fruits and vegetables on hand for snacks. ? Eat 1 piece of fruit or a handful of berries with breakfast. ? Have a salad and fruit at lunch. ? Have two kinds of vegetables at dinner.  Limit foods that are high in a substance called oxalate. These include: ? Spinach. ? Rhubarb. ? Beets. ? Potato chips and french fries. ? Nuts.  If you regularly take a diuretic medicine, make sure to eat at least 1-2 fruits or vegetables high in potassium each day. These include: ? Avocado. ? Banana. ? Orange, prune, carrot, or tomato juice. ? Baked potato. ? Cabbage. ? Beans and split   peas. General instructions   Drink enough fluid to keep your urine clear or pale yellow. This is the most important thing you can do.  Talk to your health care provider and dietitian about taking daily supplements. Depending on your health and the cause of your kidney stones, you may be advised: ? Not to take supplements with vitamin C. ? To take a calcium supplement. ? To take a daily probiotic supplement. ? To take other supplements such as magnesium, fish oil, or vitamin B6.  Take all medicines and supplements as told by your health care provider.  Limit alcohol intake to no  more than 1 drink a day for nonpregnant women and 2 drinks a day for men. One drink equals 12 oz of beer, 5 oz of wine, or 1 oz of hard liquor.  Lose weight if told by your health care provider. Work with your dietitian to find strategies and an eating plan that works best for you. What foods are not recommended? Limit your intake of the following foods, or as told by your dietitian. Talk to your dietitian about specific foods you should avoid based on the type of kidney stones and your overall health. Grains Breads. Bagels. Rolls. Baked goods. Salted crackers. Cereal. Pasta. Vegetables Spinach. Rhubarb. Beets. Canned vegetables. Pickles. Olives. Meats and other protein foods Nuts. Nut butters. Large portions of meat, poultry, or fish. Salted or cured meats. Deli meats. Hot dogs. Sausages. Dairy Cheese. Beverages Regular soft drinks. Regular vegetable juice. Seasonings and other foods Seasoning blends with salt. Salad dressings. Canned soups. Soy sauce. Ketchup. Barbecue sauce. Canned pasta sauce. Casseroles. Pizza. Lasagna. Frozen meals. Potato chips. French fries. Summary  You can reduce your risk of kidney stones by making changes to your diet.  The most important thing you can do is drink enough fluid. You should drink enough fluid to keep your urine clear or pale yellow.  Ask your health care provider or dietitian how much protein from animal sources you should eat each day, and also how much salt and calcium you should have each day. This information is not intended to replace advice given to you by your health care provider. Make sure you discuss any questions you have with your health care provider. Document Revised: 12/17/2018 Document Reviewed: 08/07/2016 Elsevier Patient Education  2020 [employer]  

## 2019-10-29 ENCOUNTER — Ambulatory Visit: Payer: Medicare Other

## 2019-10-30 ENCOUNTER — Other Ambulatory Visit: Payer: Self-pay | Admitting: Family

## 2019-10-30 DIAGNOSIS — I2510 Atherosclerotic heart disease of native coronary artery without angina pectoris: Secondary | ICD-10-CM

## 2019-10-30 DIAGNOSIS — E8881 Metabolic syndrome: Secondary | ICD-10-CM

## 2019-10-30 DIAGNOSIS — E785 Hyperlipidemia, unspecified: Secondary | ICD-10-CM

## 2019-11-08 ENCOUNTER — Other Ambulatory Visit: Payer: Self-pay | Admitting: Family Medicine

## 2019-11-08 ENCOUNTER — Other Ambulatory Visit: Payer: Self-pay | Admitting: Family

## 2019-11-08 DIAGNOSIS — E785 Hyperlipidemia, unspecified: Secondary | ICD-10-CM

## 2019-11-08 DIAGNOSIS — E8881 Metabolic syndrome: Secondary | ICD-10-CM

## 2019-11-08 DIAGNOSIS — I2510 Atherosclerotic heart disease of native coronary artery without angina pectoris: Secondary | ICD-10-CM

## 2019-11-10 ENCOUNTER — Ambulatory Visit: Payer: Medicare Other

## 2019-11-12 ENCOUNTER — Ambulatory Visit: Payer: Medicare Other | Attending: Internal Medicine

## 2019-11-12 DIAGNOSIS — Z23 Encounter for immunization: Secondary | ICD-10-CM

## 2019-11-12 NOTE — Progress Notes (Signed)
   Covid-19 Vaccination Clinic  Name:  Kevin Flores    MRN: [id number] DOB: October 27, 1940  11/12/2019  [name] was observed post Covid-19 immunization for 15 minutes without incident. He was provided with Vaccine Information Sheet and instruction to access the V-Safe system.   [name] was instructed to call 911 with any severe reactions post vaccine: Marland Kitchen Difficulty breathing  . Swelling of face and throat  . A fast heartbeat  . A bad rash all over body  . Dizziness and weakness   Immunizations Administered    Name Date Dose VIS Date Route   Pfizer COVID-19 Vaccine 11/12/2019  4:23 PM 0.3 mL 08/21/2019 Intramuscular   Manufacturer: [employer], [employer]   Lot: [id number]   NDC: [address]-4

## 2019-12-07 ENCOUNTER — Encounter: Payer: Self-pay | Admitting: Family Medicine

## 2019-12-07 ENCOUNTER — Ambulatory Visit (INDEPENDENT_AMBULATORY_CARE_PROVIDER_SITE_OTHER): Payer: Medicare Other | Admitting: Family Medicine

## 2019-12-07 DIAGNOSIS — Z7982 Long term (current) use of aspirin: Secondary | ICD-10-CM

## 2019-12-07 DIAGNOSIS — R05 Cough: Secondary | ICD-10-CM

## 2019-12-07 DIAGNOSIS — Z87891 Personal history of nicotine dependence: Secondary | ICD-10-CM

## 2019-12-07 DIAGNOSIS — J40 Bronchitis, not specified as acute or chronic: Secondary | ICD-10-CM

## 2019-12-07 DIAGNOSIS — R053 Chronic cough: Secondary | ICD-10-CM

## 2019-12-07 MED ORDER — ALBUTEROL SULFATE HFA 108 (90 BASE) MCG/ACT IN AERS: 2.0000 | INHALATION_SPRAY | Freq: Four times a day (QID) | RESPIRATORY_TRACT | 2 refills | Status: DC | PRN

## 2019-12-07 MED ORDER — BENZONATATE 100 MG PO CAPS: 100.0000 mg | ORAL_CAPSULE | Freq: Three times a day (TID) | ORAL | 0 refills | Status: DC | PRN

## 2019-12-07 MED ORDER — PREDNISONE 20 MG PO TABS: ORAL_TABLET | ORAL | 0 refills | Status: DC

## 2019-12-07 NOTE — Progress Notes (Signed)
Virtual Visit via telephone Note Due to COVID-19 pandemic this visit was conducted virtually. This visit type was conducted due to national recommendations for restrictions regarding the COVID-19 Pandemic (e.g. social distancing, sheltering in place) in an effort to limit this patient's exposure and mitigate transmission in our community. All issues noted in this document were discussed and addressed.  A physical exam was not performed with this format.   I connected with Kevin Flores on 12/07/2019 at 1245 by telephone and verified that I am speaking with the correct person using two identifiers. Kevin Flores is currently located at home and family is currently with them during visit. The provider, Kari Baars, FNP is located in their office at time of visit.  I discussed the limitations, risks, security and privacy concerns of performing an evaluation and management service by telephone and the availability of in person appointments. I also discussed with the patient that there may be a patient responsible charge related to this service. The patient expressed understanding and agreed to proceed.  Subjective:  Patient ID: Kevin Flores, male    DOB: [birth year], 79 y.o.   MRN: [id number]  Chief Complaint:  Cough   HPI: Kevin Flores is a 79 y.o. male presenting on 12/07/2019 for Cough   Ongoing cough for several months that does not seem to be improving. Pt would like to know what is causing this cough.   Cough This is a chronic problem. The current episode started more than 1 month ago. The problem has been waxing and waning. The problem occurs every few minutes. The cough is non-productive. Pertinent negatives include no chest pain, chills, ear congestion, ear pain, fever, headaches, heartburn, hemoptysis, myalgias, nasal congestion, postnasal drip, rash, rhinorrhea, sore throat, shortness of breath, sweats, weight loss or wheezing. Nothing aggravates the symptoms. He has tried a beta-agonist  inhaler, oral steroids and OTC cough suppressant for the symptoms. The treatment provided no relief. His past medical history is significant for bronchitis.     Relevant past medical, surgical, family, and social history reviewed and updated as indicated.  Allergies and medications reviewed and updated.   Past Medical History:  Diagnosis Date  . Allergy    seasonal  . Arthritis   . CAD (coronary artery disease)    1998 LIMA to the LAD, SVG to circumflex. DES placed to the PDA 11/2009   . GERD with stricture   . Gout   . Heartburn   . Hyperlipidemia   . Hypertension   . Nephrolithiasis    left  . Personal history of colonic polyps    adenomas  . Thyroid disease     Past Surgical History:  Procedure Laterality Date  . BIOPSY PROSTATE    . cardiac stents  2001   and 62376  . COLONOSCOPY W/ POLYPECTOMY  2009   3 small adenomas  . CORONARY ARTERY BYPASS GRAFT  1998   2 bypass  . cysto with calculus manipulation    . UPPER GASTROINTESTINAL ENDOSCOPY      Social History   Socioeconomic History  . Marital status: Married    Spouse name: Not on file  . Number of children: Not on file  . Years of education: Not on file  . Highest education level: Not on file  Occupational History  . Occupation: retired  Tobacco Use  . Smoking status: Former Smoker    Quit date: 12/19/1971    Years since quitting: 48.0  . Smokeless tobacco: Never Used  Substance and  Sexual Activity  . Alcohol use: No  . Drug use: No  . Sexual activity: Not on file  Other Topics Concern  . 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 Difficulty  of Paying Living Expenses:   Food Insecurity:   . Worried About Programme researcher, broadcasting/film/video in the Last Year:   . Barista in the Last Year:   Transportation Needs:   . Freight forwarder (Medical):   Marland Kitchen Lack of Transportation (Non-Medical):   Physical Activity:   . Days of Exercise per Week:   . Minutes of Exercise per Session:   Stress:   . Feeling of Stress :   Social Connections:   . Frequency of Communication with Friends and Family:   . Frequency of Social Gatherings with Friends and Family:   . Attends Religious Services:   . Active Member of Clubs or Organizations:   . Attends Banker Meetings:   Marland Kitchen Marital Status:   Intimate Partner Violence:   . Fear of Current or Ex-Partner:   . Emotionally Abused:   Marland Kitchen Physically Abused:   . Sexually Abused:     Outpatient Encounter Medications as of 12/07/2019  Medication Sig  . albuterol (VENTOLIN HFA) 108 (90 Base) MCG/ACT inhaler Inhale 2 puffs into the lungs every 6 (six) hours as needed for wheezing or shortness of breath.  . allopurinol (ZYLOPRIM) 300 MG tablet TAKE 1 TABLET BY MOUTH EVERY DAY  . amLODipine (NORVASC) 10 MG tablet Take 1 tablet (10 mg total) by mouth daily.  Marland Kitchen aspirin 81 MG chewable tablet Chew 1 tablet (81 mg total) by mouth daily.  Marland Kitchen atorvastatin (LIPITOR) 20 MG tablet TAKE 1 TABLET BY MOUTH ONCE DAILY AT 6 PM (NEEDS TO BE SEEN BEFORE NEXT REFILL)  . benzonatate (TESSALON) 100 MG capsule Take 1 capsule (100 mg total) by mouth 3 (three) times daily as needed for cough.  . Cholecalciferol (VITAMIN D-3 PO) Take 1 capsule by mouth daily.  . fish oil-omega-3 fatty acids 1000 MG capsule Take 1 g by mouth at bedtime.   . fluticasone (FLONASE) 50 MCG/ACT nasal spray Place 2 sprays into both nostrils daily. (Patient taking differently: Place 2 sprays into both nostrils daily as needed for allergies. )  . furosemide (LASIX) 20 MG tablet Take 1 tablet (20 mg total) by mouth every other day.  . levothyroxine  (SYNTHROID) 75 MCG tablet Take 1 tablet (75 mcg total) by mouth daily.  . metoprolol succinate (TOPROL XL) 25 MG 24 hr tablet Take 1 tablet (25 mg total) by mouth daily.  . predniSONE (DELTASONE) 20 MG tablet 2 po at sametime daily for 5 days  . vitamin C (ASCORBIC ACID) 500 MG tablet Take 500 mg by mouth daily.  . [DISCONTINUED] albuterol (VENTOLIN HFA) 108 (90 Base) MCG/ACT inhaler Inhale 2 puffs into the lungs every 6 (six) hours as needed for wheezing or shortness of breath.  . [DISCONTINUED] benzonatate (TESSALON) 100 MG capsule Take 1 capsule (100 mg total) by mouth 3 (three) times daily as needed  for cough.  . [DISCONTINUED] pantoprazole (PROTONIX) 40 MG tablet Take 1 tablet (40 mg total) by mouth daily.  . [DISCONTINUED] predniSONE (DELTASONE) 10 MG tablet Take 1 tablet (10 mg total) by mouth daily with breakfast.   No facility-administered encounter medications on file as of 12/07/2019.    Allergies  Allergen Reactions  . Imdur [Isosorbide Dinitrate] Other (See Comments)    Headache, "made my head swim"  . Rosuvastatin Other (See Comments)    SEVERE muscle and leg cramps/aches  . Sulfonamide Derivatives Rash and Other (See Comments)    Also "made the whites of my eyes turn yellow"    Review of Systems  Constitutional: Negative for activity change, appetite change, chills, diaphoresis, fatigue, fever, unexpected weight change and weight loss.  HENT: Negative.  Negative for ear pain, postnasal drip, rhinorrhea and sore throat.   Eyes: Negative.   Respiratory: Positive for cough. Negative for hemoptysis, chest tightness, shortness of breath, wheezing and stridor.   Cardiovascular: Negative for chest pain, palpitations and leg swelling.  Gastrointestinal: Negative for blood in stool, constipation, diarrhea, heartburn, nausea and vomiting.  Endocrine: Negative.   Genitourinary: Negative for decreased urine volume, difficulty urinating, dysuria, frequency and urgency.    Musculoskeletal: Negative for arthralgias and myalgias.  Skin: Negative.  Negative for rash.  Allergic/Immunologic: Negative.   Neurological: Negative for dizziness, weakness and headaches.  Hematological: Negative.   Psychiatric/Behavioral: Negative for confusion, hallucinations, sleep disturbance and suicidal ideas.  All other systems reviewed and are negative.        Observations/Objective: No vital signs or physical exam, this was a telephone or virtual health encounter.  Pt alert and oriented, answers all questions appropriately, and able to speak in full sentences.    Assessment and Plan: Chung was seen today for cough.  Diagnoses and all orders for this visit:  Chronic cough Bronchitis Ongoing cough with history of bronchitis. No red flags concerning for bacterial infection. Will treat with below and refer to pulmonology for evaluation.  -     albuterol (VENTOLIN HFA) 108 (90 Base) MCG/ACT inhaler; Inhale 2 puffs into the lungs every 6 (six) hours as needed for wheezing or shortness of breath. -     benzonatate (TESSALON) 100 MG capsule; Take 1 capsule (100 mg total) by mouth 3 (three) times daily as needed for cough. -     predniSONE (DELTASONE) 20 MG tablet; 2 po at sametime daily for 5 days -     Ambulatory referral to Pulmonology     Follow Up Instructions: Return in about 2 weeks (around 12/21/2019), or if symptoms worsen or fail to improve.    I discussed the assessment and treatment plan with the patient. The patient was provided an opportunity to ask questions and all were answered. The patient agreed with the plan and demonstrated an understanding of the instructions.   The patient was advised to call back or seek an in-person evaluation if the symptoms worsen or if the condition fails to improve as anticipated.  The above assessment and management plan was discussed with the patient. The patient verbalized understanding of and has agreed to the management  plan. Patient is aware to call the clinic if they develop any new symptoms or if symptoms persist or worsen. Patient is aware when to return to the clinic for a follow-up visit. Patient educated on when it is appropriate to go to the emergency department.    I provided 15 minutes of non-face-to-face time during this encounter. The  call started at 1245. The call ended at 1300. The other time was used for coordination of care.    Kari Baars, FNP-C [facility] [address] [place], [place] [address] [phone] 12/07/2019

## 2019-12-16 ENCOUNTER — Other Ambulatory Visit: Payer: Self-pay | Admitting: Family

## 2019-12-16 DIAGNOSIS — E8881 Metabolic syndrome: Secondary | ICD-10-CM

## 2019-12-16 DIAGNOSIS — I2510 Atherosclerotic heart disease of native coronary artery without angina pectoris: Secondary | ICD-10-CM

## 2019-12-16 DIAGNOSIS — E785 Hyperlipidemia, unspecified: Secondary | ICD-10-CM

## 2019-12-17 ENCOUNTER — Other Ambulatory Visit: Payer: Self-pay | Admitting: Family

## 2019-12-17 DIAGNOSIS — I10 Essential (primary) hypertension: Secondary | ICD-10-CM

## 2019-12-17 NOTE — Telephone Encounter (Signed)
°  Prescription Request  12/17/2019  What is the name of the medication or equipment? Amlodipine 10 mg. Patient has been out since Monday.  Have you contacted your pharmacy to request a refill? (if applicable) YES  Which pharmacy would you like this sent to? [facility] [place]   Patient notified that their request is being sent to the clinical staff for review and that they should receive a response within 2 business days.

## 2019-12-17 NOTE — Telephone Encounter (Signed)
LMOVM pt NTBS LOV 03/2019

## 2019-12-21 MED ORDER — AMLODIPINE BESYLATE 10 MG PO TABS: 10.0000 mg | ORAL_TABLET | Freq: Every day | ORAL | 0 refills | Status: DC

## 2019-12-21 NOTE — Telephone Encounter (Signed)
  Prescription Request  12/21/2019  What is the name of the medication or equipment? Amolodipine RX-10 mg once a day  Have you contacted your pharmacy to request a refill? (if applicable) Yes  Which pharmacy would you like this sent to? [facility]   Patient notified that their request is being sent to the clinical staff for review and that they should receive a response within 2 business days.   Lendon Colonel' pt.  He moved from [employer] to [employer] will not pay for his meds at [facility] anymore. He has been out since end of March.  Please call him today!

## 2019-12-22 ENCOUNTER — Ambulatory Visit (INDEPENDENT_AMBULATORY_CARE_PROVIDER_SITE_OTHER): Payer: Medicare Other | Admitting: *Deleted

## 2019-12-22 DIAGNOSIS — Z0000 Encounter for general adult medical examination without abnormal findings: Secondary | ICD-10-CM

## 2019-12-22 NOTE — Patient Instructions (Signed)

## 2019-12-22 NOTE — Progress Notes (Signed)
MEDICARE ANNUAL WELLNESS VISIT  12/22/2019  Telephone Visit Disclaimer This Medicare AWV was conducted by telephone due to national recommendations for restrictions regarding the COVID-19 Pandemic (e.g. social distancing).  I verified, using two identifiers, that I am speaking with Kevin Flores or their authorized healthcare agent. I discussed the limitations, risks, security, and privacy concerns of performing an evaluation and management service by telephone and the potential availability of an in-person appointment in the future. The patient expressed understanding and agreed to proceed.   Subjective:  Kevin Flores is a 79 y.o. male patient of Hawks, Edilia Bo, FNP who had a Medicare Annual Wellness Visit today via telephone. 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 he has 4 children. he reports that he is socially active and does interact with friends/family regularly. he is minimally physically active and enjoys yard work and Multimedia programmer.  Patient Care Team: Junie Spencer, FNP as PCP - General (Family Medicine) Rollene Rotunda, MD as PCP - Cardiology (Cardiology)  Advanced Directives 12/22/2019 11/06/2018 10/07/2018 09/13/2018 11/23/2017 11/22/2017 09/05/2012  Does Patient Have a Medical Advance Directive? No No No No No No Patient does not have advance directive;Patient would like information  Would patient like information on creating a medical advance directive? No - Patient declined Yes (MAU/Ambulatory/Procedural Areas - Information given) - - No - Patient declined - -  Pre-existing out of facility DNR order (yellow form or pink MOST form) - - - - - - No    Hospital Utilization Over the Past 12 Months: # of hospitalizations or ER visits: 0 # of surgeries: 0  Review of Systems    Patient reports that his overall health is unchanged compared to last year.  History obtained from chart review  Patient Reported Readings (BP, Pulse, CBG, Weight,  etc) none  Pain Assessment Pain : No/denies pain     Current Medications & Allergies (verified) Allergies as of 12/22/2019      Reactions   Imdur [isosorbide Dinitrate] Other (See Comments)   Headache, "made my head swim"   Rosuvastatin Other (See Comments)   SEVERE muscle and leg cramps/aches   Sulfonamide Derivatives Rash, Other (See Comments)   Also "made the whites of my eyes turn yellow"      Medication List       Accurate as of December 22, 2019 11:17 AM. If you have any questions, ask your nurse or doctor.        STOP taking these medications   benzonatate 100 MG capsule Commonly known as: TESSALON   predniSONE 20 MG tablet Commonly known as: Deltasone     TAKE these medications   albuterol 108 (90 Base) MCG/ACT inhaler Commonly known as: VENTOLIN HFA Inhale 2 puffs into the lungs every 6 (six) hours as needed for wheezing or shortness of breath.   allopurinol 300 MG tablet Commonly known as: ZYLOPRIM TAKE 1 TABLET BY MOUTH EVERY DAY   amLODipine 10 MG tablet Commonly known as: NORVASC Take 1 tablet (10 mg total) by mouth daily. (Needs to be seen before next refill)   aspirin 81 MG chewable tablet Chew 1 tablet (81 mg total) by mouth daily.   atorvastatin 20 MG tablet Commonly known as: LIPITOR TAKE 1 TABLET BY MOUTH ONCE DAILY AT 6 PM (NEEDS TO BE SEEN BEFORE NEXT REFILL)   fish oil-omega-3 fatty acids 1000 MG capsule Take 1 g by mouth at bedtime.   fluticasone 50 MCG/ACT nasal spray  Commonly known as: FLONASE Place 2 sprays into both nostrils daily. What changed:   when to take this  reasons to take this   furosemide 20 MG tablet Commonly known as: LASIX Take 1 tablet (20 mg total) by mouth every other day.   levothyroxine 75 MCG tablet Commonly known as: Synthroid Take 1 tablet (75 mcg total) by mouth daily.   metoprolol succinate 25 MG 24 hr tablet Commonly known as: Toprol XL Take 1 tablet (25 mg total) by mouth daily.   TURMERIC  PO Take by mouth.   vitamin C 500 MG tablet Commonly known as: ASCORBIC ACID Take 500 mg by mouth daily.   VITAMIN D-3 PO Take 1 capsule by mouth daily.       History (reviewed): Past Medical History:  Diagnosis Date  . Allergy    seasonal  . Arthritis   . CAD (coronary artery disease)    1998 LIMA to the LAD, SVG to circumflex. DES placed to the PDA 11/2009   . GERD with stricture   . Gout   . Heartburn   . Hyperlipidemia   . Hypertension   . Nephrolithiasis    left  . Personal history of colonic polyps    adenomas  . Thyroid disease    Past Surgical History:  Procedure Laterality Date  . BIOPSY PROSTATE    . cardiac stents  2001   and 20011  . COLONOSCOPY W/ POLYPECTOMY  2009   3 small adenomas  . CORONARY ARTERY BYPASS GRAFT  1998   2 bypass  . cysto with calculus manipulation    . UPPER GASTROINTESTINAL ENDOSCOPY     Family History  Problem Relation Age of Onset  . Colon cancer Brother        19s  . Diabetes Brother   . Heart disease Brother   . Diabetes Father   . Breast cancer Sister   . Esophageal cancer Neg Hx   . Liver cancer Neg Hx   . Pancreatic cancer Neg Hx   . Prostate cancer Neg Hx   . Rectal cancer Neg Hx   . Stomach cancer Neg Hx    Social History   Socioeconomic History  . Marital status: Married    Spouse name: Kevin Flores  . Number of children: 4  . Years of education: 43  . Highest education level: High school graduate  Occupational History  . Occupation: retired  Tobacco Use  . Smoking status: Former Smoker    Packs/day: 1.00    Years: 12.00    Pack years: 12.00    Types: Cigarettes    Quit date: 12/19/1971    Years since quitting: 48.0  . Smokeless tobacco: Never Used  Substance and Sexual Activity  . Alcohol use: No  . Drug use: No  . Sexual activity: Not Currently  Other Topics Concern  . 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 Difficulty of Paying Living Expenses: Somewhat hard  Food Insecurity: No Food Insecurity  . Worried About Charity fundraiser in the Last  Year: Never true  . Ran Out of Food in the Last Year: Never true  Transportation Needs: No Transportation Needs  . Lack of Transportation (Medical): No  . Lack of Transportation (Non-Medical): No  Physical Activity: Sufficiently Active  . Days of Exercise per Week: 3 days  . Minutes of Exercise per Session: 60 min  Stress: No Stress Concern Present  . Feeling of Stress : Only a little  Social Connections: Not Isolated  . Frequency of Communication with Friends and Family: More than three times a week  . Frequency of Social Gatherings with Friends and Family: More than three times a week  . Attends Religious Services: More than 4 times per year  . Active Member of Clubs or Organizations: Yes  . Attends Banker Meetings: More than 4 times per year  . Marital Status: Married    Activities of Daily Living In your present state of health, do you have any difficulty performing the following activities: 12/22/2019  Hearing? N  Vision? N  Comment wears glasses-gets eye exam every 2 years  Difficulty concentrating or making decisions? N  Walking or climbing stairs? Y  Comment gets SOB when he has bronchitis  Dressing or bathing? N  Doing errands, shopping? N  Preparing Food and eating ? N  Using the Toilet? N  In the past six months, have you accidently leaked urine? Y  Comment dribbles sometimes  Do you have problems with loss of bowel control? N  Managing your Medications? N  Managing your Finances? N  Housekeeping or managing your Housekeeping? N    Some recent data might be hidden    Patient Education/ Literacy How often do you need to have someone help you when you read instructions, pamphlets, or other written materials from your doctor or pharmacy?: 1 - Never What is the last grade level you completed in school?: 12th grade  Exercise Current Exercise Habits: Home exercise routine, Type of exercise: walking, Time (Minutes): 60, Frequency (Times/Week): 3, Weekly Exercise (Minutes/Week): 180, Intensity: Mild, Exercise limited by: cardiac condition(s)  Diet Patient reports consuming 3 meals a day and 1 snack(s) a day Patient reports that his primary diet is: Regular Patient reports that she does have regular access to food.   Depression Screen PHQ 2/9 Scores 12/22/2019 04/07/2019 10/09/2018 07/18/2018 06/12/2018 12/18/2017 12/03/2017  PHQ - 2 Score 0 0 0 0 0 0 0     Fall Risk Fall Risk  12/22/2019 04/07/2019 10/09/2018 07/18/2018 06/12/2018  Falls in the past year? 0 0 0 0 No     Objective:  Kevin Flores seemed alert and oriented and he participated appropriately during our telephone visit.  Blood Pressure Weight BMI  BP Readings from Last 3 Encounters:  05/27/19 120/78  05/19/19 120/77  05/07/19 (!) 148/84   Wt Readings from Last 3 Encounters:  05/27/19 200 lb (90.7 kg)  05/19/19 200 lb (90.7 kg)  05/07/19 200 lb 6 oz (90.9 kg)   BMI Readings from Last 1 Encounters:  05/27/19 29.53 kg/m    *Unable to obtain current vital signs, weight, and BMI due to telephone visit type  Hearing/Vision  . Kevin Flores did not seem to have difficulty with hearing/understanding during the telephone conversation . Reports that he has not had a formal eye exam by an eye care professional within the past year . Reports that he has not had a formal hearing evaluation within the past year *Unable to fully assess hearing and vision during telephone visit type  Cognitive Function: 6CIT Screen 12/22/2019  What Year? 0 points  What month? 0 points   What time? 0 points  Count back from 20 0 points  Months in reverse 0 points  Repeat phrase 0 points  Total Score 0   (Normal:0-7, Significant for Dysfunction: >8)  Normal Cognitive Function Screening: Yes   Immunization & Health Maintenance Record Immunization History  Administered Date(s) Administered  . Influenza, High Dose Seasonal PF 07/02/2017, 06/12/2018  . Influenza,inj,Quad PF,6+ Mos 07/14/2013, 07/03/2014, 06/27/2015  . Influenza-Unspecified 06/30/2019  . PFIZER SARS-COV-2 Vaccination 10/17/2019, 11/12/2019  . Pneumococcal Conjugate-13 04/06/2015  . Pneumococcal Polysaccharide-23 08/25/2010  . Tdap 05/07/2011    Health Maintenance  Topic Date Due  . INFLUENZA VACCINE  04/10/2020  . TETANUS/TDAP  05/06/2021  . PNA vac Low Risk Adult  Completed       Assessment  This is a routine wellness examination for Kevin Flores.  Health Maintenance: Due or Overdue There are no preventive care reminders to display for this patient.  Caidence Higashi does not need a referral for Community Assistance: Care Management:   no Social Work:    no Prescription Assistance:  no Nutrition/Diabetes Education:  no   Plan:  Personalized Goals Goals Addressed            This Visit's Progress   . DIET - INCREASE WATER INTAKE       Try to drink 6-8 glasses of water daily      Personalized Health Maintenance & Screening Recommendations  Shingles vaccine  Lung Cancer Screening Recommended: no (Low Dose CT Chest recommended if Age 58-80 years, 30 pack-year currently smoking OR have quit w/in past 15 years) Hepatitis C Screening recommended: no HIV Screening recommended: no  Advanced Directives: Written information was not prepared per patient's request.  Referrals & Orders No orders of the defined types were placed in this encounter.   Follow-up Plan . Follow-up with Junie Spencer, FNP as planned . Consider Shingles vaccine at your next visit with your PCP   I have  personally reviewed and noted the following in the patient's chart:   . Medical and social history . Use of alcohol, tobacco or illicit drugs  . Current medications and supplements . Functional ability and status . Nutritional status . Physical activity . Advanced directives . List of other physicians . Hospitalizations, surgeries, and ER visits in previous 12 months . Vitals . Screenings to include cognitive, depression, and falls . Referrals and appointments  In addition, I have reviewed and discussed with Kevin Flores certain preventive protocols, quality metrics, and best practice recommendations. A written personalized care plan for preventive services as well as general preventive health recommendations is available and can be mailed to the patient at his request.      Hessie Diener, LPN  6/71/2458

## 2020-01-01 ENCOUNTER — Institutional Professional Consult (permissible substitution): Payer: Medicare Other | Admitting: Internal Medicine

## 2020-01-12 ENCOUNTER — Ambulatory Visit (INDEPENDENT_AMBULATORY_CARE_PROVIDER_SITE_OTHER): Payer: Medicare Other | Admitting: Family

## 2020-01-12 ENCOUNTER — Encounter: Payer: Self-pay | Admitting: Family

## 2020-01-12 VITALS — BP 135/82

## 2020-01-12 DIAGNOSIS — K219 Gastro-esophageal reflux disease without esophagitis: Secondary | ICD-10-CM | POA: Diagnosis not present

## 2020-01-12 DIAGNOSIS — E8881 Metabolic syndrome: Secondary | ICD-10-CM | POA: Diagnosis not present

## 2020-01-12 DIAGNOSIS — E559 Vitamin D deficiency, unspecified: Secondary | ICD-10-CM

## 2020-01-12 DIAGNOSIS — E039 Hypothyroidism, unspecified: Secondary | ICD-10-CM | POA: Diagnosis not present

## 2020-01-12 DIAGNOSIS — E663 Overweight: Secondary | ICD-10-CM

## 2020-01-12 DIAGNOSIS — I509 Heart failure, unspecified: Secondary | ICD-10-CM | POA: Diagnosis not present

## 2020-01-12 DIAGNOSIS — I10 Essential (primary) hypertension: Secondary | ICD-10-CM

## 2020-01-12 DIAGNOSIS — E785 Hyperlipidemia, unspecified: Secondary | ICD-10-CM

## 2020-01-12 DIAGNOSIS — R05 Cough: Secondary | ICD-10-CM | POA: Diagnosis not present

## 2020-01-12 DIAGNOSIS — I2510 Atherosclerotic heart disease of native coronary artery without angina pectoris: Secondary | ICD-10-CM

## 2020-01-12 DIAGNOSIS — I110 Hypertensive heart disease with heart failure: Secondary | ICD-10-CM

## 2020-01-12 DIAGNOSIS — R053 Chronic cough: Secondary | ICD-10-CM

## 2020-01-12 MED ORDER — ALLOPURINOL 300 MG PO TABS: 300.0000 mg | ORAL_TABLET | Freq: Every day | ORAL | 3 refills | Status: AC

## 2020-01-12 MED ORDER — ATORVASTATIN CALCIUM 20 MG PO TABS: 20.0000 mg | ORAL_TABLET | Freq: Every day | ORAL | 3 refills | Status: AC

## 2020-01-12 MED ORDER — AMLODIPINE BESYLATE 10 MG PO TABS: 10.0000 mg | ORAL_TABLET | Freq: Every day | ORAL | 3 refills | Status: DC

## 2020-01-12 NOTE — Progress Notes (Signed)
Virtual Visit via telephone Note Due to COVID-19 pandemic this visit was conducted virtually. This visit type was conducted due to national recommendations for restrictions regarding the COVID-19 Pandemic (e.g. social distancing, sheltering in place) in an effort to limit this patient's exposure and mitigate transmission in our community. All issues noted in this document were discussed and addressed.  A physical exam was not performed with this format.  I connected with Kevin Flores on 01/12/20 at 11:08 AM by telephone and verified that I am speaking with the correct person using two identifiers. Kevin Flores is currently located at home and no one is currently with him during visit. The provider, Evelina Dun, FNP is located in their office at time of visit.  I discussed the limitations, risks, security and privacy concerns of performing an evaluation and management service by telephone and the availability of in person appointments. I also discussed with the patient that there may be a patient responsible charge related to this service. The patient expressed understanding and agreed to proceed.   History and Present Illness:  Pt presents to the office today for chronic follow up. He is followed by a Cardiologists every 6 months For CHF and CAD. He is followed by Nephrologists every 6 months.  Hypertension This is a chronic problem. The current episode started more than 1 year ago. The problem has been resolved since onset. The problem is controlled. Associated symptoms include malaise/fatigue, peripheral edema ("when I walk all day") and shortness of breath ("no more than usual"). Pertinent negatives include no headaches. Risk factors for coronary artery disease include dyslipidemia, obesity and sedentary lifestyle. The current treatment provides moderate improvement. Identifiable causes of hypertension include a thyroid problem.  Gastroesophageal Reflux He complains of belching and heartburn.  This is a chronic problem. The current episode started more than 1 year ago. The problem occurs occasionally. The problem has been waxing and waning. The symptoms are aggravated by certain foods. Associated symptoms include fatigue. He has tried a PPI for the symptoms. The treatment provided moderate relief.  Thyroid Problem Presents for follow-up visit. Symptoms include fatigue. Patient reports no constipation, diarrhea or dry skin. The symptoms have been stable. His past medical history is significant for hyperlipidemia.  Hyperlipidemia This is a chronic problem. The current episode started more than 1 year ago. The problem is controlled. Recent lipid tests were reviewed and are normal. Associated symptoms include shortness of breath ("no more than usual"). Current antihyperlipidemic treatment includes statins. The current treatment provides moderate improvement of lipids.      Review of Systems  Constitutional: Positive for fatigue and malaise/fatigue.  Respiratory: Positive for shortness of breath ("no more than usual").   Gastrointestinal: Positive for heartburn. Negative for constipation and diarrhea.  Neurological: Negative for headaches.  All other systems reviewed and are negative.    Observations/Objective: No SOB or distress noted   Assessment and Plan: Kevin Flores comes in today with chief complaint of No chief complaint on file.   Diagnosis and orders addressed:  1. Congestive heart failure, unspecified HF chronicity, unspecified heart failure type ([place]) - CMP14+EGFR; Future - CBC with Differential/Platelet; Future  2. Essential hypertension - CMP14+EGFR; Future - CBC with Differential/Platelet; Future - amLODipine (NORVASC) 10 MG tablet; Take 1 tablet (10 mg total) by mouth daily. (Needs to be seen before next refill)  Dispense: 90 tablet; Refill: 3  3. Gastroesophageal reflux disease, unspecified whether esophagitis present - CMP14+EGFR; Future - CBC with  Differential/Platelet; Future  4. Hypothyroidism,  unspecified type - CMP14+EGFR; Future - CBC with Differential/Platelet; Future - TSH; Future  5. Atherosclerosis of native coronary artery of native heart without angina pectoris - CMP14+EGFR; Future - CBC with Differential/Platelet; Future - atorvastatin (LIPITOR) 20 MG tablet; Take 1 tablet (20 mg total) by mouth daily.  Dispense: 90 tablet; Refill: 3  6. Chronic cough - CMP14+EGFR; Future - CBC with Differential/Platelet; Future  7. Hyperlipidemia, unspecified hyperlipidemia type - CMP14+EGFR; Future - CBC with Differential/Platelet; Future - Lipid panel; Future - atorvastatin (LIPITOR) 20 MG tablet; Take 1 tablet (20 mg total) by mouth daily.  Dispense: 90 tablet; Refill: 3  8. Overweight (BMI 25.0-29.9) - CMP14+EGFR; Future - CBC with Differential/Platelet; Future  9. Metabolic syndrome - HAL93+XTKW; Future - CBC with Differential/Platelet; Future - Lipid panel; Future - atorvastatin (LIPITOR) 20 MG tablet; Take 1 tablet (20 mg total) by mouth daily.  Dispense: 90 tablet; Refill: 3  10. Vitamin D deficiency - CMP14+EGFR; Future - CBC with Differential/Platelet; Future - VITAMIN D 25 Hydroxy (Vit-D Deficiency, Fractures); Future   Labs pending Health Maintenance reviewed Diet and exercise encouraged  Follow up plan: 6 months       I discussed the assessment and treatment plan with the patient. The patient was provided an opportunity to ask questions and all were answered. The patient agreed with the plan and demonstrated an understanding of the instructions.   The patient was advised to call back or seek an in-person evaluation if the symptoms worsen or if the condition fails to improve as anticipated.  The above assessment and management plan was discussed with the patient. The patient verbalized understanding of and has agreed to the management plan. Patient is aware to call the clinic if symptoms persist or  worsen. Patient is aware when to return to the clinic for a follow-up visit. Patient educated on when it is appropriate to go to the emergency department.   Time call ended:  11:20 pm   I provided 12 minutes of non-face-to-face time during this encounter.    Evelina Dun, FNP

## 2020-01-19 ENCOUNTER — Ambulatory Visit (INDEPENDENT_AMBULATORY_CARE_PROVIDER_SITE_OTHER): Payer: Medicare Other | Admitting: Pulmonary Disease

## 2020-01-19 ENCOUNTER — Other Ambulatory Visit: Payer: Self-pay

## 2020-01-19 ENCOUNTER — Encounter: Payer: Self-pay | Admitting: Pulmonary Disease

## 2020-01-19 ENCOUNTER — Ambulatory Visit (HOSPITAL_COMMUNITY)
Admission: RE | Admit: 2020-01-19 | Discharge: 2020-01-19 | Disposition: A | Discharge status: Home / Self Care | Payer: Medicare Other | Source: Ambulatory Visit | Attending: Pulmonary Disease | Admitting: Pulmonary Disease

## 2020-01-19 ENCOUNTER — Institutional Professional Consult (permissible substitution): Payer: Medicare Other | Admitting: Pulmonary Disease

## 2020-01-19 ENCOUNTER — Other Ambulatory Visit: Payer: Medicare Other

## 2020-01-19 VITALS — BP 140/78 | HR 77 | Temp 97.3°F | Ht 68.0 in | Wt 200.0 lb

## 2020-01-19 DIAGNOSIS — R053 Chronic cough: Secondary | ICD-10-CM

## 2020-01-19 DIAGNOSIS — E785 Hyperlipidemia, unspecified: Secondary | ICD-10-CM

## 2020-01-19 DIAGNOSIS — I10 Essential (primary) hypertension: Secondary | ICD-10-CM

## 2020-01-19 DIAGNOSIS — R0982 Postnasal drip: Secondary | ICD-10-CM

## 2020-01-19 DIAGNOSIS — R05 Cough: Secondary | ICD-10-CM | POA: Insufficient documentation

## 2020-01-19 DIAGNOSIS — J301 Allergic rhinitis due to pollen: Secondary | ICD-10-CM | POA: Diagnosis not present

## 2020-01-19 DIAGNOSIS — I2510 Atherosclerotic heart disease of native coronary artery without angina pectoris: Secondary | ICD-10-CM

## 2020-01-19 DIAGNOSIS — E8881 Metabolic syndrome: Secondary | ICD-10-CM

## 2020-01-19 DIAGNOSIS — K219 Gastro-esophageal reflux disease without esophagitis: Secondary | ICD-10-CM

## 2020-01-19 DIAGNOSIS — I509 Heart failure, unspecified: Secondary | ICD-10-CM

## 2020-01-19 DIAGNOSIS — E663 Overweight: Secondary | ICD-10-CM

## 2020-01-19 DIAGNOSIS — E039 Hypothyroidism, unspecified: Secondary | ICD-10-CM

## 2020-01-19 DIAGNOSIS — E559 Vitamin D deficiency, unspecified: Secondary | ICD-10-CM | POA: Diagnosis not present

## 2020-01-19 MED ORDER — FLUTICASONE PROPIONATE 50 MCG/ACT NA SUSP: 1.0000 | Freq: Every day | NASAL | 6 refills | Status: AC

## 2020-01-19 MED ORDER — LEVOCETIRIZINE DIHYDROCHLORIDE 5 MG PO TABS: 5.0000 mg | ORAL_TABLET | Freq: Every evening | ORAL | Status: AC

## 2020-01-19 MED ORDER — MONTELUKAST SODIUM 10 MG PO TABS
10.0000 mg | ORAL_TABLET | Freq: Every day | ORAL | 5 refills | Status: DC
Start: 2020-01-19 — End: 2020-07-18

## 2020-01-19 NOTE — Patient Instructions (Signed)
Will schedule chest xray and pulmonary function test  Can stop using symbicort  Flonase 1 spray in each nostril daily to help with allergies and post nasal drainage  Montelukast (singulair) 10 mg pill nightly to help with allergies and post nasal drainage  Levocetirizine (xyzal) 5 mg pill nightly to help with allergies and post nasal drainage  Follow up in 4 weeks

## 2020-01-19 NOTE — Progress Notes (Signed)
[facility]  Chief Complaint  Patient presents with  . Consult    Patient is here for chronic cough and bronchitis. Patient has had cough for several years. Cough is dry unless he has bronchitis. The last couple days he has been coughing up clear sputum that is thick and sticky and has runny nose. Gets bronchitis about twice a year. Patient states that when he lays down at night he wheezes a lot. 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    Constitutional:  BP 140/78 (BP Location: Left Arm, Patient Position: Sitting, Cuff Size: Normal)   Pulse 77   Temp (!) 97.3 F (36.3 C) (Temporal)   Ht 5\' 8"  (1.727 m)   Wt 200 lb (90.7 kg)   SpO2 96%   BMI 30.41 kg/m   Past Medical History:  Allergies, CAD s/p CABG, GERD, Gout, HLD, HTN, Nephrolithiasis, Colon polyps, Hypothyroidism  Summary:  Kevin Flores is a 79 y.o. male former smoker with cough.  Subjective:   He has noticed cough for several years.  Doesn't recall anything specifically that triggered this.  He usually has a dry cough.  He will gets sinus congestion and post nasal drip during the Spring.  This causes him to cough and get bronchitis.  He will bring up clear sputum.  Denies hemoptysis.  Not having chest tightness.  Tried symbicort, but didn't help.  He has used flonase before and this helped.  Not using any antihistamines at present.  No recent chest xray and doesn't remember having PFT before.  Doesn't feel like he has any trouble with his breathing while walking.  His cough is worse at night and will get wheezing at night when he coughs.  He quit smoking in the 1970's.  No history of pneumonia or TB.  No recent sick exposures or travel history.  Denies animal/bird exposure.   Physical Exam:   Appearance - well kempt  ENMT - no sinus tenderness, no nasal discharge, no oral exudate, Mallampati  2, wears dentures  Respiratory - no wheeze, or rales  CV - regular rate and rhythm, no murmurs  GI - soft, non tender  Lymph - no adenopathy noted in neck  Ext - no edema  Skin - no rashes  Neuro - normal strength, oriented x 3  Psych - normal mood and affect  Discussion:  He has chronic cough for several years.  His cough is productive during the Spring time likely related to allergic rhinitis with post nasal drip.  However, he has dry cough otherwise when he is not having allergy symptoms.    Assessment/Plan:   Chronic cough. - will arrange for pulmonary function testing and chest xray to further assess - hasn't noticed clinical benefit from recent trial of symbicort; advised he can stop this for now  Allergic rhinitis with post nasal drip. - this seems like most prominent issue at present related to his cough - will have him resume use of flonase on daily basis - will add xyzal and singulair at night - might need further allergy testing  CAD, Hypertension, chronic diastolic CHF. - followed by Dr. Percival Spanish with [facility] heart care   A total of 47 minutes addressing patient care on the day of the visit.  Follow up:  Patient Instructions  Will schedule chest xray and pulmonary function test  Can stop  using symbicort  Flonase 1 spray in each nostril daily to help with allergies and post nasal drainage  Montelukast (singulair) 10 mg pill nightly to help with allergies and post nasal drainage  Levocetirizine (xyzal) 5 mg pill nightly to help with allergies and post nasal drainage  Follow up in 4 weeks   Signature:  Coralyn Helling, MD [place] Pager: [phone] 01/19/2020, 11:42 AM  Flow Sheet     Pulmonary tests:    Cardiac tests:  Echo 11/24/17 >> EF 50 to 55%, mod LVH, mild AR, mod MR, mod LA dilation, mild/mod TR, PAS 51 mmHg  Medications:   Allergies as of 01/19/2020      Reactions   Imdur [isosorbide Dinitrate] Other (See  Comments)   Headache, "made my head swim"   Rosuvastatin Other (See Comments)   SEVERE muscle and leg cramps/aches   Sulfonamide Derivatives Rash, Other (See Comments)   Also "made the whites of my eyes turn yellow"      Medication List       Accurate as of Jan 19, 2020 11:42 AM. If you have any questions, ask your nurse or doctor.        albuterol 108 (90 Base) MCG/ACT inhaler Commonly known as: VENTOLIN HFA Inhale 2 puffs into the lungs every 6 (six) hours as needed for wheezing or shortness of breath.   allopurinol 300 MG tablet Commonly known as: ZYLOPRIM Take 1 tablet (300 mg total) by mouth daily.   amLODipine 10 MG tablet Commonly known as: NORVASC Take 1 tablet (10 mg total) by mouth daily. (Needs to be seen before next refill)   aspirin 81 MG chewable tablet Chew 1 tablet (81 mg total) by mouth daily.   atorvastatin 20 MG tablet Commonly known as: LIPITOR Take 1 tablet (20 mg total) by mouth daily.   fish oil-omega-3 fatty acids 1000 MG capsule Take 1 g by mouth at bedtime.   fluticasone 50 MCG/ACT nasal spray Commonly known as: FLONASE Place 1 spray into both nostrils daily. What changed: how much to take Changed by: Coralyn Helling, MD   furosemide 20 MG tablet Commonly known as: LASIX Take 1 tablet (20 mg total) by mouth every other day.   levocetirizine 5 MG tablet Commonly known as: XYZAL Take 1 tablet (5 mg total) by mouth every evening. Started by: Coralyn Helling, MD   levothyroxine 75 MCG tablet Commonly known as: Synthroid Take 1 tablet (75 mcg total) by mouth daily.   metoprolol succinate 25 MG 24 hr tablet Commonly known as: Toprol XL Take 1 tablet (25 mg total) by mouth daily.   montelukast 10 MG tablet Commonly known as: SINGULAIR Take 1 tablet (10 mg total) by mouth at bedtime. Started by: Coralyn Helling, MD   TURMERIC PO Take by mouth.   vitamin C 500 MG tablet Commonly known as: ASCORBIC ACID Take 500 mg by mouth daily.   VITAMIN  D-3 PO Take 1 capsule by mouth daily.       Past Surgical History:  He  has a past surgical history that includes Coronary artery bypass graft (1998); cardiac stents (2001   and 63846); Colonoscopy w/ polypectomy (2009); Upper gastrointestinal endoscopy; Biopsy prostate; and cysto with calculus manipulation.  Family History:  His family history includes Breast cancer in his sister; Colon cancer in his brother; Diabetes in his brother and father; Heart disease in his brother.  Social History:  He  reports that he quit smoking about 48 years ago. His smoking  use included cigarettes. He has a 12.00 pack-year smoking history. He has never used smokeless tobacco. He reports that he does not drink alcohol or use drugs.

## 2020-01-20 LAB — CMP14+EGFR
ALT: 14 IU/L (ref 0–44)
AST: 22 IU/L (ref 0–40)
Albumin/Globulin Ratio: 1.4 (ref 1.2–2.2)
Albumin: 4.1 g/dL (ref 3.7–4.7)
Alkaline Phosphatase: 110 IU/L (ref 39–117)
BUN/Creatinine Ratio: 11 (ref 10–24)
BUN: 17 mg/dL (ref 8–27)
Bilirubin Total: 0.8 mg/dL (ref 0.0–1.2)
CO2: 22 mmol/L (ref 20–29)
Calcium: 9.7 mg/dL (ref 8.6–10.2)
Chloride: 104 mmol/L (ref 96–106)
Creatinine, Ser: 1.54 mg/dL — ABNORMAL HIGH (ref 0.76–1.27)
GFR calc Af Amer: 50 mL/min/{1.73_m2} — ABNORMAL LOW (ref >59)
GFR calc non Af Amer: 43 mL/min/{1.73_m2} — ABNORMAL LOW (ref >59)
Globulin, Total: 3 g/dL (ref 1.5–4.5)
Glucose: 113 mg/dL — ABNORMAL HIGH (ref 65–99)
Potassium: 4.7 mmol/L (ref 3.5–5.2)
Sodium: 142 mmol/L (ref 134–144)
Total Protein: 7.1 g/dL (ref 6.0–8.5)

## 2020-01-20 LAB — VITAMIN D 25 HYDROXY (VIT D DEFICIENCY, FRACTURES): Vit D, 25-Hydroxy: 51.7 ng/mL (ref 30.0–100.0)

## 2020-01-20 LAB — LIPID PANEL
Chol/HDL Ratio: 3.4 ratio (ref 0.0–5.0)
Cholesterol, Total: 116 mg/dL (ref 100–199)
HDL: 34 mg/dL — ABNORMAL LOW (ref >39)
LDL Chol Calc (NIH): 66 mg/dL (ref 0–99)
Triglycerides: 76 mg/dL (ref 0–149)
VLDL Cholesterol Cal: 16 mg/dL (ref 5–40)

## 2020-01-20 LAB — CBC WITH DIFFERENTIAL/PLATELET
Basophils Absolute: 0 10*3/uL (ref 0.0–0.2)
Basos: 0 %
EOS (ABSOLUTE): 0.1 10*3/uL (ref 0.0–0.4)
Eos: 1 %
Hematocrit: 48.9 % (ref 37.5–51.0)
Hemoglobin: 16.4 g/dL (ref 13.0–17.7)
Immature Grans (Abs): 0 10*3/uL (ref 0.0–0.1)
Immature Granulocytes: 0 %
Lymphocytes Absolute: 1.8 10*3/uL (ref 0.7–3.1)
Lymphs: 19 %
MCH: 30 pg (ref 26.6–33.0)
MCHC: 33.5 g/dL (ref 31.5–35.7)
MCV: 90 fL (ref 79–97)
Monocytes Absolute: 0.9 10*3/uL (ref 0.1–0.9)
Monocytes: 9 %
Neutrophils Absolute: 6.6 10*3/uL (ref 1.4–7.0)
Neutrophils: 71 %
Platelets: 169 10*3/uL (ref 150–450)
RBC: 5.46 x10E6/uL (ref 4.14–5.80)
RDW: 13.6 % (ref 11.6–15.4)
WBC: 9.4 10*3/uL (ref 3.4–10.8)

## 2020-01-20 LAB — TSH: TSH: 2.32 u[IU]/mL (ref 0.450–4.500)

## 2020-01-21 ENCOUNTER — Telehealth: Payer: Self-pay | Admitting: Pulmonary Disease

## 2020-01-21 NOTE — Telephone Encounter (Signed)
DG Chest 2 View  Result Date: 01/20/2020 CLINICAL DATA:  Chronic cough for several years, hypertension EXAM: CHEST - 2 VIEW COMPARISON:  11/22/2017 FINDINGS: Frontal and lateral views of the chest demonstrate postsurgical changes from median sternotomy. The cardiac silhouette is unremarkable. No airspace disease, effusion, or pneumothorax. Stable background scarring. No acute bony abnormalities. IMPRESSION: 1. Stable exam, no acute process. Electronically Signed   By: Sharlet Salina M.D.   On: 01/20/2020 02:15     Please let him know his chest xray was normal.

## 2020-01-22 NOTE — Telephone Encounter (Signed)
Called and spoke with patient about CXR results. He is scheduled for PFT on 6/1 but still needs to be set up for covid test. Patient will call back to see if he can do 5/28 or 5/29 he will have to see what he can work out at work. Will wait to hear from patient

## 2020-01-31 ENCOUNTER — Other Ambulatory Visit: Payer: Self-pay | Admitting: Family Medicine

## 2020-02-05 ENCOUNTER — Other Ambulatory Visit (HOSPITAL_COMMUNITY)
Admission: RE | Admit: 2020-02-05 | Discharge: 2020-02-05 | Disposition: A | Discharge status: Home / Self Care | Payer: Medicare Other | Source: Ambulatory Visit | Attending: Pulmonary Disease | Admitting: Pulmonary Disease

## 2020-02-05 ENCOUNTER — Other Ambulatory Visit: Payer: Self-pay

## 2020-02-05 DIAGNOSIS — Z01812 Encounter for preprocedural laboratory examination: Secondary | ICD-10-CM | POA: Diagnosis not present

## 2020-02-05 DIAGNOSIS — Z20822 Contact with and (suspected) exposure to covid-19: Secondary | ICD-10-CM | POA: Insufficient documentation

## 2020-02-06 LAB — SARS CORONAVIRUS 2 (TAT 6-24 HRS): SARS Coronavirus 2: NEGATIVE

## 2020-02-09 ENCOUNTER — Other Ambulatory Visit: Payer: Self-pay

## 2020-02-09 ENCOUNTER — Ambulatory Visit (HOSPITAL_COMMUNITY)
Admission: RE | Admit: 2020-02-09 | Discharge: 2020-02-09 | Disposition: A | Discharge status: Home / Self Care | Payer: Medicare Other | Source: Ambulatory Visit | Attending: Pulmonary Disease | Admitting: Pulmonary Disease

## 2020-02-09 ENCOUNTER — Telehealth: Payer: Self-pay | Admitting: Family

## 2020-02-09 DIAGNOSIS — R053 Chronic cough: Secondary | ICD-10-CM

## 2020-02-09 DIAGNOSIS — R05 Cough: Secondary | ICD-10-CM | POA: Insufficient documentation

## 2020-02-09 LAB — PULMONARY FUNCTION TEST
DL/VA % pred: 108 %
DL/VA: 4.31 ml/min/mmHg/L
DLCO cor % pred: 66 %
DLCO cor: 16.06 ml/min/mmHg
DLCO unc % pred: 69 %
DLCO unc: 16.82 ml/min/mmHg
FEF 25-75 Post: 2.65 L/sec
FEF 25-75 Pre: 2.31 L/sec
FEF2575-%Change-Post: 14 %
FEF2575-%Pred-Post: 127 %
FEF2575-%Pred-Pre: 110 %
FEV1-%Change-Post: 2 %
FEV1-%Pred-Post: 73 %
FEV1-%Pred-Pre: 72 %
FEV1-Post: 2.14 L
FEV1-Pre: 2.09 L
FEV1FVC-%Change-Post: 6 %
FEV1FVC-%Pred-Pre: 112 %
FEV6-%Change-Post: -3 %
FEV6-%Pred-Post: 65 %
FEV6-%Pred-Pre: 67 %
FEV6-Post: 2.47 L
FEV6-Pre: 2.55 L
FEV6FVC-%Change-Post: 0 %
FEV6FVC-%Pred-Post: 106 %
FEV6FVC-%Pred-Pre: 106 %
FVC-%Change-Post: -3 %
FVC-%Pred-Post: 61 %
FVC-%Pred-Pre: 63 %
FVC-Post: 2.47 L
FVC-Pre: 2.56 L
Post FEV1/FVC ratio: 87 %
Post FEV6/FVC ratio: 100 %
Pre FEV1/FVC ratio: 82 %
Pre FEV6/FVC Ratio: 100 %
RV % pred: 66 %
RV: 1.68 L
TLC % pred: 64 %
TLC: 4.4 L

## 2020-02-09 MED ORDER — PANTOPRAZOLE SODIUM 20 MG PO TBEC
20.0000 mg | DELAYED_RELEASE_TABLET | Freq: Every day | ORAL | 3 refills | Status: DC
Start: 2020-02-09 — End: 2020-03-21

## 2020-02-09 MED ORDER — ALBUTEROL SULFATE (2.5 MG/3ML) 0.083% IN NEBU
2.5000 mg | INHALATION_SOLUTION | Freq: Once | RESPIRATORY_TRACT | Status: AC
  Administered 2020-02-09: 2.5 mg via RESPIRATORY_TRACT

## 2020-02-09 NOTE — Telephone Encounter (Signed)
Do not see on patients med list please advise

## 2020-02-09 NOTE — Telephone Encounter (Signed)
  Prescription Request  02/09/2020  What is the name of the medication or equipment?PANTOPRAZOLE  Have you contacted your pharmacy to request a refill? (if applicable) yes  Which pharmacy would you like this sent to? [facility], pt states Dr. Algis Downs prescribed this for him and needs refill    Patient notified that their request is being sent to the clinical staff for review and that they should receive a response within 2 business days.

## 2020-02-09 NOTE — Telephone Encounter (Signed)
Prescription sent to pharmacy.

## 2020-02-18 ENCOUNTER — Ambulatory Visit (INDEPENDENT_AMBULATORY_CARE_PROVIDER_SITE_OTHER): Payer: Medicare Other | Admitting: Pulmonary Disease

## 2020-02-18 ENCOUNTER — Other Ambulatory Visit: Payer: Self-pay

## 2020-02-18 ENCOUNTER — Encounter: Payer: Self-pay | Admitting: Pulmonary Disease

## 2020-02-18 VITALS — BP 144/84 | HR 80 | Temp 97.3°F | Ht 68.0 in | Wt 198.0 lb

## 2020-02-18 DIAGNOSIS — R0982 Postnasal drip: Secondary | ICD-10-CM

## 2020-02-18 DIAGNOSIS — I2510 Atherosclerotic heart disease of native coronary artery without angina pectoris: Secondary | ICD-10-CM | POA: Diagnosis not present

## 2020-02-18 DIAGNOSIS — R05 Cough: Secondary | ICD-10-CM | POA: Diagnosis not present

## 2020-02-18 DIAGNOSIS — R053 Chronic cough: Secondary | ICD-10-CM

## 2020-02-18 DIAGNOSIS — J301 Allergic rhinitis due to pollen: Secondary | ICD-10-CM

## 2020-02-18 NOTE — Progress Notes (Signed)
[facility]  Chief Complaint  Patient presents with  . Follow-up    had PFT 02/09/20.  Cough has improved some, but has not resolved- prod with clear, "sticky" sputum. He is wheezing less.     Constitutional:  BP (!) 144/84 (BP Location: Left Arm, Cuff Size: Normal)   Pulse 80   Temp (!) 97.3 F (36.3 C) (Other (Comment)) Comment (Src): wrist  Ht 5\' 8"  (1.727 m)   Wt 198 lb (89.8 kg)   SpO2 96%   BMI 30.11 kg/m   Past Medical History:  Allergies, CAD s/p CABG, GERD, Gout, HLD, HTN, Nephrolithiasis, Colon polyps, Hypothyroidism  Summary:  Kevin Flores is a 79 y.o. male former smoker with cough.  Subjective:   He had CXR from 01/20/20 was normal.  PFT from 02/09/20 showed mild restrictive and diffusion defect that corrects for lung volumes.  His cough is about 75% improved.  Still has some sinus drainage and dry cough.  Wheezing resolved.  Physical Exam:    Appearance - well kempt   ENMT - no sinus tenderness, no oral exudate, no LAN, Mallampati 2 airway, no stridor, wears dentures  Respiratory - equal breath sounds bilaterally, no wheezing or rales  CV - s1s2 regular rate and rhythm, no murmurs  Ext - no clubbing, no edema  Skin - no rashes  Psych - normal mood and affect  Assessment/Plan:   Chronic cough. - likely from allergic rhinitis and post nasal drip - improving  Allergic rhinitis with post nasal drip. - continue flonase, xyzal, singulair for now - reassess in several weeks, and then determine if he can start stepping down his regimen - if he is not able to decrease medication needs, then might need additional allergy testing  Restrictive defect. - seen on PFT from 02/09/20 - mild - CXR normal - uncertain significance - monitor clinically  CAD, Hypertension, chronic diastolic CHF. - followed by Dr. Percival Spanish with [facility] heart care  A total of  22 minutes spent addressing patient care issues on day of  visit.   Follow up:  Patient Instructions  Follow up in 8 weeks   Signature:  Chesley Mires, MD [place] Pager: [phone] 02/18/2020, 9:12 AM  Flow Sheet     Pulmonary tests:   PFT 02/09/20 >> FEV1 2.14 (73%), FEV1% 87, TLC 4.40 (64%), DLCO 69%  Cardiac tests:   Echo 11/24/17 >> EF 50 to 55%, mod LVH, mild AR, mod MR, mod LA dilation, mild/mod TR, PAS 51 mmHg  Medications:   Allergies as of 02/18/2020      Reactions   Imdur [isosorbide Dinitrate] Other (See Comments)   Headache, "made my head swim"   Rosuvastatin Other (See Comments)   SEVERE muscle and leg cramps/aches   Sulfonamide Derivatives Rash, Other (See Comments)   Also "made the whites of my eyes turn yellow"      Medication List       Accurate as of February 18, 2020  9:12 AM. If you have any questions, ask your nurse or doctor.        albuterol 108 (90 Base) MCG/ACT inhaler Commonly known as: VENTOLIN HFA Inhale 2 puffs into the lungs every 6 (six) hours as needed for wheezing or shortness of breath.   allopurinol 300 MG tablet Commonly known as: ZYLOPRIM Take 1 tablet (300 mg total) by mouth daily.   amLODipine 10 MG tablet Commonly known as: NORVASC Take 1 tablet (10 mg total) by  mouth daily. (Needs to be seen before next refill)   aspirin 81 MG chewable tablet Chew 1 tablet (81 mg total) by mouth daily.   atorvastatin 20 MG tablet Commonly known as: LIPITOR Take 1 tablet (20 mg total) by mouth daily.   fish oil-omega-3 fatty acids 1000 MG capsule Take 1 g by mouth at bedtime.   fluticasone 50 MCG/ACT nasal spray Commonly known as: FLONASE Place 1 spray into both nostrils daily.   furosemide 20 MG tablet Commonly known as: LASIX Take 1 tablet (20 mg total) by mouth every other day.   levocetirizine 5 MG tablet Commonly known as: XYZAL Take 1 tablet (5 mg total) by mouth every evening.   levothyroxine 75 MCG tablet Commonly known as: Synthroid Take 1 tablet  (75 mcg total) by mouth daily.   metoprolol succinate 25 MG 24 hr tablet Commonly known as: Toprol XL Take 1 tablet (25 mg total) by mouth daily.   montelukast 10 MG tablet Commonly known as: SINGULAIR Take 1 tablet (10 mg total) by mouth at bedtime.   pantoprazole 20 MG tablet Commonly known as: Protonix Take 1 tablet (20 mg total) by mouth daily.   TURMERIC PO Take by mouth.   vitamin C 500 MG tablet Commonly known as: ASCORBIC ACID Take 500 mg by mouth daily.   VITAMIN D-3 PO Take 1 capsule by mouth daily.       Past Surgical History:  He  has a past surgical history that includes Coronary artery bypass graft (1998); cardiac stents (2001   and 06237); Colonoscopy w/ polypectomy (2009); Upper gastrointestinal endoscopy; Biopsy prostate; and cysto with calculus manipulation.  Family History:  His family history includes Breast cancer in his sister; Colon cancer in his brother; Diabetes in his brother and father; Heart disease in his brother.  Social History:  He  reports that he quit smoking about 48 years ago. His smoking use included cigarettes. He has a 12.00 pack-year smoking history. He has never used smokeless tobacco. He reports that he does not drink alcohol and does not use drugs.

## 2020-02-18 NOTE — Patient Instructions (Signed)
Follow up in 8 weeks 

## 2020-03-19 ENCOUNTER — Other Ambulatory Visit: Payer: Self-pay | Admitting: Family Medicine

## 2020-03-19 DIAGNOSIS — I509 Heart failure, unspecified: Secondary | ICD-10-CM

## 2020-03-19 DIAGNOSIS — I2510 Atherosclerotic heart disease of native coronary artery without angina pectoris: Secondary | ICD-10-CM

## 2020-03-21 ENCOUNTER — Telehealth: Payer: Self-pay | Admitting: Family

## 2020-03-21 MED ORDER — PANTOPRAZOLE SODIUM 40 MG PO TBEC
40.0000 mg | DELAYED_RELEASE_TABLET | Freq: Every day | ORAL | 3 refills | Status: DC
Start: 2020-03-21 — End: 2020-06-14

## 2020-03-21 NOTE — Telephone Encounter (Signed)
Protonix 40 mg Prescription sent to pharmacy.

## 2020-03-21 NOTE — Telephone Encounter (Signed)
Patient aware.

## 2020-03-22 ENCOUNTER — Encounter: Payer: Self-pay | Admitting: Nurse Practitioner

## 2020-03-22 ENCOUNTER — Ambulatory Visit (INDEPENDENT_AMBULATORY_CARE_PROVIDER_SITE_OTHER): Payer: Medicare Other | Admitting: Nurse Practitioner

## 2020-03-22 DIAGNOSIS — R05 Cough: Secondary | ICD-10-CM

## 2020-03-22 DIAGNOSIS — I2510 Atherosclerotic heart disease of native coronary artery without angina pectoris: Secondary | ICD-10-CM

## 2020-03-22 DIAGNOSIS — R059 Cough, unspecified: Secondary | ICD-10-CM

## 2020-03-22 MED ORDER — PREDNISONE 20 MG PO TABS: ORAL_TABLET | ORAL | 0 refills | Status: DC

## 2020-03-22 NOTE — Progress Notes (Signed)
   Virtual Visit via telephone Note Due to COVID-19 pandemic this visit was conducted virtually. This visit type was conducted due to national recommendations for restrictions regarding the COVID-19 Pandemic (e.g. social distancing, sheltering in place) in an effort to limit this patient's exposure and mitigate transmission in our community. All issues noted in this document were discussed and addressed.  A physical exam was not performed with this format.  I connected with Kevin Flores on 03/22/20 at 12:35 by telephone and verified that I am speaking with the correct person using two identifiers. Kevin Flores is currently located at home and no one is currently with him during visit. The provider, Mary-Margaret Daphine Deutscher, FNP is located in their office at time of visit.  I discussed the limitations, risks, security and privacy concerns of performing an evaluation and management service by telephone and the availability of in person appointments. I also discussed with the patient that there may be a patient responsible charge related to this service. The patient expressed understanding and agreed to proceed.   History and Present Illness:   Chief Complaint: Cough   HPI Patient calls in c/o of a cough. Cough started over a week ago. Is wet cough with increase in wheezing. Prednisone usually helps him.  * sees pulmonology August 17,2021 Review of Systems  Constitutional: Negative for chills and fever.  HENT: Positive for congestion (mild).   Respiratory: Positive for cough (deepand wet) and sputum production (slight).   Cardiovascular: Negative for chest pain and palpitations.  Neurological: Negative for dizziness and headaches.  Psychiatric/Behavioral: Negative.   All other systems reviewed and are negative.    Observations/Objective: Alert and oriented No acute distress Deep wet cough nted Voice hoarse     Assessment and Plan: Kevin Flores in today with chief complaint of  Cough   1. Cough Rest  Avoid going outside in heat of the day Keep follow up appointment with pulmonology Delsym or mucinex otc - predniSONE (DELTASONE) 20 MG tablet; 2 po at sametime daily for 5 days  Dispense: 10 tablet; Refill: 0   Follow Up Instructions: Keep follow up with pulmonology    I discussed the assessment and treatment plan with the patient. The patient was provided an opportunity to ask questions and all were answered. The patient agreed with the plan and demonstrated an understanding of the instructions.   The patient was advised to call back or seek an in-person evaluation if the symptoms worsen or if the condition fails to improve as anticipated.  The above assessment and management plan was discussed with the patient. The patient verbalized understanding of and has agreed to the management plan. Patient is aware to call the clinic if symptoms persist or worsen. Patient is aware when to return to the clinic for a follow-up visit. Patient educated on when it is appropriate to go to the emergency department.   Time call ended:  12:53  I provided 18 minutes of non-face-to-face time during this encounter.    Mary-Margaret Daphine Deutscher, FNP

## 2020-03-30 ENCOUNTER — Other Ambulatory Visit: Payer: Self-pay | Admitting: Family

## 2020-04-26 ENCOUNTER — Ambulatory Visit (INDEPENDENT_AMBULATORY_CARE_PROVIDER_SITE_OTHER): Payer: Medicare Other | Admitting: Pulmonary Disease

## 2020-04-26 ENCOUNTER — Other Ambulatory Visit: Payer: Self-pay

## 2020-04-26 ENCOUNTER — Encounter: Payer: Self-pay | Admitting: Pulmonary Disease

## 2020-04-26 VITALS — BP 142/82 | HR 79 | Temp 97.0°F | Ht 69.0 in | Wt 197.4 lb

## 2020-04-26 DIAGNOSIS — R053 Chronic cough: Secondary | ICD-10-CM

## 2020-04-26 DIAGNOSIS — R05 Cough: Secondary | ICD-10-CM

## 2020-04-26 DIAGNOSIS — J301 Allergic rhinitis due to pollen: Secondary | ICD-10-CM

## 2020-04-26 DIAGNOSIS — R0982 Postnasal drip: Secondary | ICD-10-CM

## 2020-04-26 DIAGNOSIS — I2510 Atherosclerotic heart disease of native coronary artery without angina pectoris: Secondary | ICD-10-CM | POA: Diagnosis not present

## 2020-04-26 MED ORDER — GUAIFENESIN ER 600 MG PO TB12
1200.0000 mg | ORAL_TABLET | Freq: Two times a day (BID) | ORAL | Status: DC | PRN
Start: 2020-04-26 — End: 2020-05-13

## 2020-04-26 MED ORDER — BUDESONIDE-FORMOTEROL FUMARATE 160-4.5 MCG/ACT IN AERO: 2.0000 | INHALATION_SPRAY | Freq: Two times a day (BID) | RESPIRATORY_TRACT | 6 refills | Status: AC

## 2020-04-26 NOTE — Progress Notes (Signed)
[place] [facility]  Chief Complaint  Patient presents with  . Follow-up    shortness of breath with exertion, non productive cough which has gotten worse    Constitutional:  BP (!) 142/82 (BP Location: Left Arm, Cuff Size: Normal)   Pulse 79   Temp (!) 97 F (36.1 C) (Other (Comment)) Comment (Src): Wrist  Ht 5\' 9"  (1.753 m)   Wt 197 lb 6.4 oz (89.5 kg)   SpO2 97% Comment: Room air  BMI 29.15 kg/m   Past Medical History:  Allergies, CAD s/p CABG, GERD, Gout, HLD, HTN, Nephrolithiasis, Colon polyps, Hypothyroidism  Summary:  Kevin Flores is a 79 y.o. male former smoker with cough.  Subjective:   He was given course of prednisone in July for cough.  Still has cough.  Has been using ventolin intermittently and this helps some.  Was on symbicort before and helped, but not using now.  Has sinus congestion and post nasal drip.  Feels like something stuck in his throat and he can't always cough up phlegm.  Gets more drainage at night and then has some wheezing.   No reflux, skin rashes, fever.  Using flonase, neti pot, singulair.  Hasn't been using xyzal.  Physical Exam:   Appearance - well kempt   ENMT - no sinus tenderness, clear nasal drainage, deviated septum, no oral exudate, no LAN, Mallampati 2 airway, no stridor, wears dentures  Respiratory - equal breath sounds bilaterally, no wheezing or rales  CV - s1s2 regular rate and rhythm, no murmurs  Ext - no clubbing, no edema  Skin - no rashes  Psych - normal mood and affect   Assessment/Plan:   Chronic cough. - likely from allergic rhinitis and post nasal drip, and asthma  Allergic rhinitis with post nasal drip. - symptoms worse - restart xyzal - continue flonase, neti pot, singulair - tsp local honey prn - sip water with urge to cough, salt water gargle - avoid clearing throat or forcing cough - mucinex prn - if symptoms persist, might need additional allergy  testing  Asthmatic bronchitis. - will have his try symbicort - continue singulair - prn ventolin  Restrictive defect. - seen on PFT from 02/09/20 - mild - CXR normal - uncertain significance - if symptoms persists, then might need CT imaging of chest  CAD, Hypertension, chronic diastolic CHF. - followed by Dr. 04/10/20 with [facility] heart care  A total of 32 minutes spent addressing patient care issues on day of visit.   Follow up:  Patient Instructions  Continue using Neti Pot Flonase one spray in each nostril daily Xyzal allergy pill daily Continue using singulair 10 mg pill nightly Salt water gargle daily Use sugarless candy to keep your mouth moist Avoid clearing your throat or forcing a cough 1 teaspoon of local honey daily while having a cough Symbicort two puffs in the morning and two puffs in the evening, and rinse your mouth after each use Ventolin two puffs every 6 hours as needed for cough  Follow up in 2 months   Signature:  [facility], MD [place] [facility] Pager: [phone] 04/26/2020, 10:13 AM  Flow Sheet     Pulmonary tests:   PFT 02/09/20 >> FEV1 2.14 (73%), FEV1% 87, TLC 4.40 (64%), DLCO 69%  Cardiac tests:   Echo 11/24/17 >> EF 50 to 55%, mod LVH, mild AR, mod MR, mod LA dilation, mild/mod TR, PAS 51 mmHg  Medications:   Allergies as of 04/26/2020  Reactions   Imdur [isosorbide Dinitrate] Other (See Comments)   Headache, "made my head swim"   Rosuvastatin Other (See Comments)   SEVERE muscle and leg cramps/aches   Sulfonamide Derivatives Rash, Other (See Comments)   Also "made the whites of my eyes turn yellow"      Medication List       Accurate as of April 26, 2020 10:13 AM. If you have any questions, ask your nurse or doctor.        STOP taking these medications   predniSONE 20 MG tablet Commonly known as: Deltasone Stopped by: Coralyn Helling, MD     TAKE these medications   albuterol 108 (90 Base) MCG/ACT  inhaler Commonly known as: VENTOLIN HFA Inhale 2 puffs into the lungs every 6 (six) hours as needed for wheezing or shortness of breath.   allopurinol 300 MG tablet Commonly known as: ZYLOPRIM Take 1 tablet (300 mg total) by mouth daily.   amLODipine 10 MG tablet Commonly known as: NORVASC Take 1 tablet (10 mg total) by mouth daily. (Needs to be seen before next refill)   aspirin 81 MG chewable tablet Chew 1 tablet (81 mg total) by mouth daily.   atorvastatin 20 MG tablet Commonly known as: LIPITOR Take 1 tablet (20 mg total) by mouth daily.   budesonide-formoterol 160-4.5 MCG/ACT inhaler Commonly known as: Symbicort Inhale 2 puffs into the lungs in the morning and at bedtime. Started by: Coralyn Helling, MD   fish oil-omega-3 fatty acids 1000 MG capsule Take 1 g by mouth at bedtime.   fluticasone 50 MCG/ACT nasal spray Commonly known as: FLONASE Place 1 spray into both nostrils daily.   furosemide 20 MG tablet Commonly known as: LASIX TAKE 1 TABLET BY MOUTH EVERY OTHER DAY   guaiFENesin 600 MG 12 hr tablet Commonly known as: Mucinex Take 2 tablets (1,200 mg total) by mouth 2 (two) times daily as needed for cough or to loosen phlegm. Started by: Coralyn Helling, MD   levocetirizine 5 MG tablet Commonly known as: XYZAL Take 1 tablet (5 mg total) by mouth every evening.   levothyroxine 75 MCG tablet Commonly known as: SYNTHROID TAKE 1 TABLET BY MOUTH EVERY DAY   metoprolol succinate 25 MG 24 hr tablet Commonly known as: Toprol XL Take 1 tablet (25 mg total) by mouth daily.   montelukast 10 MG tablet Commonly known as: SINGULAIR Take 1 tablet (10 mg total) by mouth at bedtime.   pantoprazole 40 MG tablet Commonly known as: PROTONIX Take 1 tablet (40 mg total) by mouth daily.   TURMERIC PO Take by mouth.   vitamin C 500 MG tablet Commonly known as: ASCORBIC ACID Take 500 mg by mouth daily.   VITAMIN D-3 PO Take 1 capsule by mouth daily.       Past Surgical  History:  He  has a past surgical history that includes Coronary artery bypass graft (1998); cardiac stents (2001   and 08657); Colonoscopy w/ polypectomy (2009); Upper gastrointestinal endoscopy; Biopsy prostate; and cysto with calculus manipulation.  Family History:  His family history includes Breast cancer in his sister; Colon cancer in his brother; Diabetes in his brother and father; Heart disease in his brother.  Social History:  He  reports that he quit smoking about 48 years ago. His smoking use included cigarettes. He has a 12.00 pack-year smoking history. He has never used smokeless tobacco. He reports that he does not drink alcohol and does not use drugs.

## 2020-04-26 NOTE — Patient Instructions (Signed)
Continue using Neti Pot Flonase one spray in each nostril daily Xyzal allergy pill daily Continue using singulair 10 mg pill nightly Salt water gargle daily Use sugarless candy to keep your mouth moist Avoid clearing your throat or forcing a cough 1 teaspoon of local honey daily while having a cough Symbicort two puffs in the morning and two puffs in the evening, and rinse your mouth after each use Ventolin two puffs every 6 hours as needed for cough  Follow up in 2 months

## 2020-05-06 ENCOUNTER — Other Ambulatory Visit: Payer: Self-pay | Admitting: Family Medicine

## 2020-05-06 DIAGNOSIS — I10 Essential (primary) hypertension: Secondary | ICD-10-CM

## 2020-05-13 ENCOUNTER — Other Ambulatory Visit: Payer: Self-pay

## 2020-05-17 MED ORDER — GUAIFENESIN ER 600 MG PO TB12: 1200.0000 mg | ORAL_TABLET | Freq: Two times a day (BID) | ORAL | Status: AC | PRN

## 2020-06-07 DIAGNOSIS — Z7189 Other specified counseling: Secondary | ICD-10-CM | POA: Insufficient documentation

## 2020-06-07 DIAGNOSIS — I493 Ventricular premature depolarization: Secondary | ICD-10-CM | POA: Insufficient documentation

## 2020-06-07 DIAGNOSIS — I5032 Chronic diastolic (congestive) heart failure: Secondary | ICD-10-CM | POA: Insufficient documentation

## 2020-06-07 NOTE — Progress Notes (Signed)
Cardiology Office Note   Date:  06/09/2020   ID:  Kevin Flores, DOB 03/26/1941, MRN [id number]  PCP:  Junie Spencer, FNP  Cardiologist:   Rollene Rotunda, MD   Chief Complaint  Patient presents with  . Cough      History of Present Illness: Kevin Flores is a 79 y.o. male who presents follow up of CAD.   He has a history of CAD s/p CABG 1998, DES RCA 2001, DES PDA 2011 (LIMA-LAD & SVG-OM ok).   He was last admitted at [facility] 3/15-3/18/2019 for bradycardia and CHF exacerbation, weight at discharge 198 pounds.  His creatinine was 2.05 at discharge and  1.68 at recheck 3/22.  Myoview was intermediate risk but because of high risk of AKI, he was managed medically.   Since I last saw him he has had lots of problems with cough and shortness of breath.  He has been seeing Dr. Craige Cotta and has had treatment of bronchitis.  Has had steroids.  Is been very frustrated but he says in the last few weeks he started to have less cough and a little less short of breath although he is not where he was when I saw him a year ago.  He is not describing chest pressure, neck or arm discomfort but he does not think this was ever a prominent symptom when he had his blockages previously.  He is not been having any new palpitations but he has occasional skipped beats but has had for years.  Has had some ventricular bigeminy.  Of note he does have shortness of breath a couple of hours after going to sleep but he says this is sinus drainage.  He gets up and he uses his Nettie pot and other sinus medications.  He has some mild chronic lower extremity swelling but he is on his feet quite a bit.  He still works part-time.   Past Medical History:  Diagnosis Date  . Allergy    seasonal  . Arthritis   . CAD (coronary artery disease)    1998 LIMA to the LAD, SVG to circumflex. DES placed to the PDA 11/2009   . GERD with stricture   . Gout   . Heartburn   . Hyperlipidemia   . Hypertension   . Nephrolithiasis    left   . Personal history of colonic polyps    adenomas  . Thyroid disease     Past Surgical History:  Procedure Laterality Date  . BIOPSY PROSTATE    . cardiac stents  2001   and 83382  . COLONOSCOPY W/ POLYPECTOMY  2009   3 small adenomas  . CORONARY ARTERY BYPASS GRAFT  1998   2 bypass  . cysto with calculus manipulation    . UPPER GASTROINTESTINAL ENDOSCOPY       Current Outpatient Medications  Medication Sig Dispense Refill  . albuterol (VENTOLIN HFA) 108 (90 Base) MCG/ACT inhaler Inhale 2 puffs into the lungs every 6 (six) hours as needed for wheezing or shortness of breath. 8 g 2  . allopurinol (ZYLOPRIM) 300 MG tablet Take 1 tablet (300 mg total) by mouth daily. 90 tablet 3  . amLODipine (NORVASC) 10 MG tablet Take 1 tablet (10 mg total) by mouth daily. (Needs to be seen before next refill) 90 tablet 3  . aspirin 81 MG chewable tablet Chew 1 tablet (81 mg total) by mouth daily. 90 tablet 3  . atorvastatin (LIPITOR) 20 MG tablet Take 1 tablet (20 mg  total) by mouth daily. 90 tablet 3  . budesonide-formoterol (SYMBICORT) 160-4.5 MCG/ACT inhaler Inhale 2 puffs into the lungs in the morning and at bedtime. 1 each 6  . Cholecalciferol (VITAMIN D-3 PO) Take 1 capsule by mouth daily.    . fish oil-omega-3 fatty acids 1000 MG capsule Take 1 g by mouth at bedtime.     . fluticasone (FLONASE) 50 MCG/ACT nasal spray Place 1 spray into both nostrils daily. 16 g 6  . furosemide (LASIX) 20 MG tablet TAKE 1 TABLET BY MOUTH EVERY OTHER DAY 45 tablet 1  . guaiFENesin (MUCINEX) 600 MG 12 hr tablet Take 2 tablets (1,200 mg total) by mouth 2 (two) times daily as needed for cough or to loosen phlegm.    Marland Kitchen levocetirizine (XYZAL) 5 MG tablet Take 1 tablet (5 mg total) by mouth every evening.    Marland Kitchen levothyroxine (SYNTHROID) 75 MCG tablet TAKE 1 TABLET BY MOUTH EVERY DAY 90 tablet 3  . metoprolol succinate (TOPROL-XL) 25 MG 24 hr tablet TAKE 1 TABLET BY MOUTH EVERY DAY 90 tablet 0  . montelukast  (SINGULAIR) 10 MG tablet Take 1 tablet (10 mg total) by mouth at bedtime. 30 tablet 5  . pantoprazole (PROTONIX) 40 MG tablet Take 1 tablet (40 mg total) by mouth daily. 30 tablet 3  . TURMERIC PO Take by mouth.    . vitamin C (ASCORBIC ACID) 500 MG tablet Take 500 mg by mouth daily.     No current facility-administered medications for this visit.    Allergies:   Imdur [isosorbide dinitrate], Rosuvastatin, and Sulfonamide derivatives    ROS:  Please see the history of present illness.   Otherwise, review of systems are positive for none.   All other systems are reviewed and negative.    PHYSICAL EXAM: VS:  BP 136/76   Pulse 84   Ht 5\' 9"  (1.753 m)   Wt 197 lb 12.8 oz (89.7 kg)   BMI 29.21 kg/m  , BMI Body mass index is 29.21 kg/m.  GENERAL:  Well appearing NECK:  No jugular venous distention, waveform within normal limits, carotid upstroke brisk and symmetric, no bruits, no thyromegaly LUNGS:  Clear to auscultation bilaterally CHEST:  Unremarkable HEART:  PMI not displaced or sustained,S1 and S2 within normal limits, no S3, no S4, no clicks, no rubs, soft apical systolic murmur intermittently murmurs, ventricular bigeminy ABD:  Flat, positive bowel sounds normal in frequency in pitch, no bruits, no rebound, no guarding, no midline pulsatile mass, no hepatomegaly, no splenomegaly EXT:  2 plus pulses throughout, mild right greater than left leg edema, no cyanosis no clubbing   EKG:  EKG is ordered today. Sinus rhythm, rate 84, left axis, poor anterior no acute ST-T wave changes.  PVCs in a bigeminal pattern  Recent Labs: 01/19/2020: ALT 14; BUN 17; Creatinine, Ser 1.54; Hemoglobin 16.4; Platelets 169; Potassium 4.7; Sodium 142; TSH 2.320    Lipid Panel    Component Value Date/Time   CHOL 116 01/19/2020 0807   TRIG 76 01/19/2020 0807   HDL 34 (L) 01/19/2020 0807   CHOLHDL 3.4 01/19/2020 0807   CHOLHDL 4.7 11/18/2009 0347   VLDL 27 11/18/2009 0347   LDLCALC 66 01/19/2020  0807   LDLDIRECT 77 09/12/2017 1345      Wt Readings from Last 3 Encounters:  06/09/20 197 lb 12.8 oz (89.7 kg)  04/26/20 197 lb 6.4 oz (89.5 kg)  02/18/20 198 lb (89.8 kg)      Other studies Reviewed:  Additional studies/ records that were reviewed today include: Pulmonary records Review of the above records demonstrates: See below   ASSESSMENT AND PLAN:  CHRONIC DIASTOLIC HF:     He seems to be relatively euvolemic though he has a little lower extremity swelling.  However, with his cough and shortness of breath I will check a BNP level.   CAD:   He had an intermediate risk study a couple of years ago.  I will have a low threshold when he comes back if he is having worsening symptoms of possibly repeating this.   HTN:  The blood pressure is at target.  No change in therapy.   MR: He has had some mild to moderate mitral regurgitation with an abnormal echo in 2019.  I am going to repeat this study.  PVCs: He is in ventricular bigeminy but really has not noticed this.  No change in therapy.  COVID EDUCATION: He has had his vaccine and we talked about the booster.  Current medicines are reviewed at length with the patient today.  The patient does not have concerns regarding medicines.  The following changes have been made:  None  Labs/ tests ordered today include:   Orders Placed This Encounter  Procedures  . Brain natriuretic peptide  . EKG 12-Lead  . ECHOCARDIOGRAM COMPLETE     Disposition:   FU with me in 3 months.     Signed, Rollene Rotunda, MD  06/09/2020 10:06 AM    [facility]

## 2020-06-09 ENCOUNTER — Ambulatory Visit (INDEPENDENT_AMBULATORY_CARE_PROVIDER_SITE_OTHER): Payer: Medicare Other | Admitting: Cardiology

## 2020-06-09 ENCOUNTER — Other Ambulatory Visit: Payer: Self-pay

## 2020-06-09 ENCOUNTER — Encounter: Payer: Self-pay | Admitting: Cardiology

## 2020-06-09 VITALS — BP 136/76 | HR 84 | Ht 69.0 in | Wt 197.8 lb

## 2020-06-09 DIAGNOSIS — I10 Essential (primary) hypertension: Secondary | ICD-10-CM

## 2020-06-09 DIAGNOSIS — I493 Ventricular premature depolarization: Secondary | ICD-10-CM | POA: Diagnosis not present

## 2020-06-09 DIAGNOSIS — Z7189 Other specified counseling: Secondary | ICD-10-CM

## 2020-06-09 DIAGNOSIS — R0602 Shortness of breath: Secondary | ICD-10-CM

## 2020-06-09 DIAGNOSIS — I5032 Chronic diastolic (congestive) heart failure: Secondary | ICD-10-CM

## 2020-06-09 DIAGNOSIS — I2510 Atherosclerotic heart disease of native coronary artery without angina pectoris: Secondary | ICD-10-CM

## 2020-06-09 NOTE — Patient Instructions (Signed)
Medication Instructions:  Your physician recommends that you continue on your current medications as directed. Please refer to the Current Medication list given to you today.  *If you need a refill on your cardiac medications before your next appointment, please call your pharmacy*   Lab Work: BNP today  If you have labs (blood work) drawn today and your tests are completely normal, you will receive your results only by: Marland Kitchen MyChart Message (if you have MyChart) OR . A paper copy in the mail If you have any lab test that is abnormal or we need to change your treatment, we will call you to review the results.   Testing/Procedures: Your physician has requested that you have an echocardiogram. Echocardiography is a painless test that uses sound waves to create images of your heart. It provides your doctor with information about the size and shape of your heart and how well your heart's chambers and valves are working. This procedure takes approximately one hour. There are no restrictions for this procedure.  This will be done at our [facility] location:  [address]   Follow-Up: At [facility], you and your health needs are our priority.  As part of our continuing mission to provide you with exceptional heart care, we have created designated Provider Care Teams.  These Care Teams include your primary Cardiologist (physician) and Advanced Practice Providers (APPs -  Physician Assistants and Nurse Practitioners) who all work together to provide you with the care you need, when you need it.  We recommend signing up for the patient portal called "MyChart".  Sign up information is provided on this After Visit Summary.  MyChart is used to connect with patients for Virtual Visits (Telemedicine).  Patients are able to view lab/test results, encounter notes, upcoming appointments, etc.  Non-urgent messages can be sent to your provider as well.   To learn more about what you can do  with MyChart, go to [email or web address].    Your next appointment:   3 month(s)  The format for your next appointment:   In Person  Provider:   Rollene Rotunda, MD

## 2020-06-10 LAB — BRAIN NATRIURETIC PEPTIDE: BNP: 774.8 pg/mL — ABNORMAL HIGH (ref 0.0–100.0)

## 2020-06-13 ENCOUNTER — Telehealth: Payer: Self-pay | Admitting: *Deleted

## 2020-06-13 NOTE — Telephone Encounter (Signed)
-----   Message from Rollene Rotunda, MD sent at 06/11/2020 10:32 AM EDT ----- He does have an increased BNP.  I would like for him to take 40 mg Lasix for three days and then go back to previous dosing.  I would like for him to get a BMET in one week.  Call Kevin Flores with the results and send results to Junie Spencer, FNP.  Echo is pending.

## 2020-06-13 NOTE — Telephone Encounter (Signed)
Left message to call back  

## 2020-06-14 ENCOUNTER — Other Ambulatory Visit: Payer: Self-pay | Admitting: Family

## 2020-06-21 ENCOUNTER — Ambulatory Visit (INDEPENDENT_AMBULATORY_CARE_PROVIDER_SITE_OTHER): Payer: Medicare Other | Admitting: Pulmonary Disease

## 2020-06-21 ENCOUNTER — Encounter: Payer: Self-pay | Admitting: Pulmonary Disease

## 2020-06-21 ENCOUNTER — Other Ambulatory Visit: Payer: Self-pay

## 2020-06-21 ENCOUNTER — Other Ambulatory Visit (HOSPITAL_COMMUNITY)
Admission: RE | Admit: 2020-06-21 | Discharge: 2020-06-21 | Disposition: A | Discharge status: Home / Self Care | Payer: Medicare Other | Source: Ambulatory Visit | Attending: Pulmonary Disease | Admitting: Pulmonary Disease

## 2020-06-21 VITALS — BP 140/86 | HR 81 | Temp 97.9°F | Ht 69.0 in | Wt 193.4 lb

## 2020-06-21 DIAGNOSIS — J342 Deviated nasal septum: Secondary | ICD-10-CM

## 2020-06-21 DIAGNOSIS — R0982 Postnasal drip: Secondary | ICD-10-CM

## 2020-06-21 DIAGNOSIS — J301 Allergic rhinitis due to pollen: Secondary | ICD-10-CM

## 2020-06-21 DIAGNOSIS — J4540 Moderate persistent asthma, uncomplicated: Secondary | ICD-10-CM

## 2020-06-21 DIAGNOSIS — I2510 Atherosclerotic heart disease of native coronary artery without angina pectoris: Secondary | ICD-10-CM

## 2020-06-21 DIAGNOSIS — R053 Chronic cough: Secondary | ICD-10-CM

## 2020-06-21 DIAGNOSIS — Z23 Encounter for immunization: Secondary | ICD-10-CM | POA: Diagnosis not present

## 2020-06-21 LAB — CBC WITH DIFFERENTIAL/PLATELET
Abs Immature Granulocytes: 0.03 10*3/uL (ref 0.00–0.07)
Basophils Absolute: 0 10*3/uL (ref 0.0–0.1)
Basophils Relative: 0 %
Eosinophils Absolute: 0.1 10*3/uL (ref 0.0–0.5)
Eosinophils Relative: 1 %
HCT: 45.5 % (ref 39.0–52.0)
Hemoglobin: 14.7 g/dL (ref 13.0–17.0)
Immature Granulocytes: 0 %
Lymphocytes Relative: 12 %
Lymphs Abs: 1.1 10*3/uL (ref 0.7–4.0)
MCH: 29.9 pg (ref 26.0–34.0)
MCHC: 32.3 g/dL (ref 30.0–36.0)
MCV: 92.5 fL (ref 80.0–100.0)
Monocytes Absolute: 0.9 10*3/uL (ref 0.1–1.0)
Monocytes Relative: 10 %
Neutro Abs: 7.2 10*3/uL (ref 1.7–7.7)
Neutrophils Relative %: 77 %
Platelets: 168 10*3/uL (ref 150–400)
RBC: 4.92 MIL/uL (ref 4.22–5.81)
RDW: 14.8 % (ref 11.5–15.5)
WBC: 9.4 10*3/uL (ref 4.0–10.5)
nRBC: 0 % (ref 0.0–0.2)

## 2020-06-21 LAB — COMPREHENSIVE METABOLIC PANEL
ALT: 17 U/L (ref 0–44)
AST: 20 U/L (ref 15–41)
Albumin: 3.8 g/dL (ref 3.5–5.0)
Alkaline Phosphatase: 72 U/L (ref 38–126)
Anion gap: 10 (ref 5–15)
BUN: 19 mg/dL (ref 8–23)
CO2: 24 mmol/L (ref 22–32)
Calcium: 9.2 mg/dL (ref 8.9–10.3)
Chloride: 105 mmol/L (ref 98–111)
Creatinine, Ser: 1.5 mg/dL — ABNORMAL HIGH (ref 0.61–1.24)
GFR, Estimated: 44 mL/min — ABNORMAL LOW (ref >60)
Glucose, Bld: 101 mg/dL — ABNORMAL HIGH (ref 70–99)
Potassium: 3.9 mmol/L (ref 3.5–5.1)
Sodium: 139 mmol/L (ref 135–145)
Total Bilirubin: 1.5 mg/dL — ABNORMAL HIGH (ref 0.3–1.2)
Total Protein: 7.2 g/dL (ref 6.5–8.1)

## 2020-06-21 NOTE — Progress Notes (Signed)
[place] [facility]  Chief Complaint  Patient presents with  . Follow-up    productive cough with thick, clear phlegm    Constitutional:  BP 140/86 (BP Location: Left Arm, Cuff Size: Normal)   Pulse 81   Temp 97.9 F (36.6 C) (Other (Comment)) Comment (Src): wrist  Ht 5\' 9"  (1.753 m)   Wt 193 lb 6.4 oz (87.7 kg)   SpO2 97% Comment: Room air  BMI 28.56 kg/m   Past Medical History:  Allergies, CAD s/p CABG, GERD, Gout, HLD, HTN, Nephrolithiasis, Colon polyps, Hypothyroidism  Past Surgical History:  His  has a past surgical history that includes Coronary artery bypass graft (1998); cardiac stents (2001   and 09-24-1986); Colonoscopy w/ polypectomy (2009); Upper gastrointestinal endoscopy; Biopsy prostate; and cysto with calculus manipulation.  Brief Summary:  Kevin Flores is a 79 y.o. male former smoker with chronic cough.      Subjective:   He continues to have cough.  He feels that his symptoms were 7 out of 10 in June and now less than 5 out of 10.  He continues to have sinus drainage and post nasal drip especially when he lays flat.  This triggers through irritation.  He still has cough, but doesn't happen as frequently.  He looked up his symptoms on line and feels his symptoms fit with diagnosis of asthmatic bronchitis.  Physical Exam:   Appearance - well kempt   ENMT - no sinus tenderness, no oral exudate, no LAN, Mallampati 2 airway, no stridor, deviated nasal septum  Respiratory - equal breath sounds bilaterally, no wheezing or rales  CV - s1s2 regular rate and rhythm, no murmurs  Ext - no clubbing, no edema  Skin - no rashes  Psych - normal mood and affect   Pulmonary testing:   PFT 02/09/20 >> FEV1 2.14 (73%), FEV1% 87, TLC 4.40 (64%), DLCO 69%  Cardiac Tests:   Echo 11/24/17 >> EF 50 to 55%, mod LVH, mild AR, mod MR, mod LA dilation, mild/mod TR, PAS 51 mmHg  Social History:  He  reports that he quit smoking about 48  years ago. His smoking use included cigarettes. He has a 12.00 pack-year smoking history. He has never used smokeless tobacco. He reports that he does not drink alcohol and does not use drugs.  Family History:  His family history includes Breast cancer in his sister; Colon cancer in his brother; Diabetes in his brother and father; Heart disease in his brother.    Discussion:  Chronic cough likely from allergic rhinitis and post nasal drip with deviated nasal septum, and asthma.  He continues to have symptoms despite being on aggressive medication regimen.  Need to assess whether he has structural problem in his nasal passages or airways that could be contributing to his cough persisting.  Assessment/Plan:   Allergic rhinitis with post nasal drip. - he has deviated nasal septum - will check CBC with diff, CMET, IgE with RAST, CT sinuses - based on test results will determine if he needs further allergy therapy, assessment for biologic agent, and/or referral to ENT - continue xyzal, flonase, singulair - continue netti pot - local honey, salt water gargles, mucinex prn  Asthmatic bronchitis. - continue symbicort, singulair, prn ventolin - check CBC with diff, IgE with RAST, HRCT chest to assess for central airway lesion that could be contributing to his cough - might need to consider biologic agent based on test results - high dose influenza vaccine  today  Restrictive defect. - seen on PFT from 02/09/20 - will get high resolution CT chest to assess whether he has early ILD that could be contributing to his persistent cough  CAD, Hypertension, chronic diastolic CHF, mitral regurgitation, PVCs. - followed by Dr. Antoine Poche with [facility] heart care - he has f/u Echo scheduled for later this month - discussed how mitral regurgitation and PVCs can sometimes cause chronic cough  Time Spent Involved in Patient Care on Day of Examination:  34 minutes  Follow up:  Patient Instructions  Will  arrange for lab tests, CT sinuses and CT chest  Follow up in 4 weeks   Medication List:   Allergies as of 06/21/2020      Reactions   Imdur [isosorbide Dinitrate] Other (See Comments)   Headache, "made my head swim"   Rosuvastatin Other (See Comments)   SEVERE muscle and leg cramps/aches   Sulfonamide Derivatives Rash, Other (See Comments)   Also "made the whites of my eyes turn yellow"      Medication List       Accurate as of June 21, 2020  9:49 AM. If you have any questions, ask your nurse or doctor.        albuterol 108 (90 Base) MCG/ACT inhaler Commonly known as: VENTOLIN HFA Inhale 2 puffs into the lungs every 6 (six) hours as needed for wheezing or shortness of breath.   allopurinol 300 MG tablet Commonly known as: ZYLOPRIM Take 1 tablet (300 mg total) by mouth daily.   amLODipine 10 MG tablet Commonly known as: NORVASC Take 1 tablet (10 mg total) by mouth daily. (Needs to be seen before next refill)   aspirin 81 MG chewable tablet Chew 1 tablet (81 mg total) by mouth daily.   atorvastatin 20 MG tablet Commonly known as: LIPITOR Take 1 tablet (20 mg total) by mouth daily.   budesonide-formoterol 160-4.5 MCG/ACT inhaler Commonly known as: Symbicort Inhale 2 puffs into the lungs in the morning and at bedtime.   fish oil-omega-3 fatty acids 1000 MG capsule Take 1 g by mouth at bedtime.   fluticasone 50 MCG/ACT nasal spray Commonly known as: FLONASE Place 1 spray into both nostrils daily.   furosemide 20 MG tablet Commonly known as: LASIX TAKE 1 TABLET BY MOUTH EVERY OTHER DAY   guaiFENesin 600 MG 12 hr tablet Commonly known as: Mucinex Take 2 tablets (1,200 mg total) by mouth 2 (two) times daily as needed for cough or to loosen phlegm.   levocetirizine 5 MG tablet Commonly known as: XYZAL Take 1 tablet (5 mg total) by mouth every evening.   levothyroxine 75 MCG tablet Commonly known as: SYNTHROID TAKE 1 TABLET BY MOUTH EVERY DAY     metoprolol succinate 25 MG 24 hr tablet Commonly known as: TOPROL-XL TAKE 1 TABLET BY MOUTH EVERY DAY   montelukast 10 MG tablet Commonly known as: SINGULAIR Take 1 tablet (10 mg total) by mouth at bedtime.   pantoprazole 40 MG tablet Commonly known as: PROTONIX TAKE 1 TABLET BY MOUTH EVERY DAY   TURMERIC PO Take by mouth.   vitamin C 500 MG tablet Commonly known as: ASCORBIC ACID Take 500 mg by mouth daily.   VITAMIN D-3 PO Take 1 capsule by mouth daily.       Signature:  Coralyn Helling, MD [facility] Pulmonary/Critical Care Pager - [phone] 06/21/2020, 9:49 AM

## 2020-06-21 NOTE — Patient Instructions (Signed)
Will arrange for lab tests, CT sinuses and CT chest  Follow up in 4 weeks

## 2020-06-23 LAB — MISC LABCORP TEST (SEND OUT): Labcorp test code: 602628

## 2020-06-24 ENCOUNTER — Telehealth: Payer: Self-pay | Admitting: Cardiology

## 2020-06-24 NOTE — Telephone Encounter (Signed)
Pt c/o swelling: STAT is pt has developed SOB within 24 hours  1) How much weight have you gained and in what time span?   Weighed 194 lbs on Tuesday, when he weighed again a few days later he was at 198  2) If swelling, where is the swelling located?   Ankles  3) Are you currently taking a fluid pill?   Yes  4) Are you currently SOB?   Not right now, when walking and exerting himself he runs out of breath quickly   5) Do you have a log of your daily weights (if so, list)?   No  6) Have you gained 3 pounds in a day or 5 pounds in a week?   Gained 4 lbs in 3 days  7) Have you traveled recently?  No  Please call. He would like to know if he should increase his fluid pill mg

## 2020-06-24 NOTE — Telephone Encounter (Signed)
Spoke with pt, he is having SOB with exertion, + swelling in his feet and ankles. His weight is up 2-3 lbs and he is having trouble with orthopnea. He was instructed to take another 20 mg of furosemide now and then 2 tomorrow morning.  He will call back the first of the week if his symptoms get worse or do not improve.

## 2020-06-27 ENCOUNTER — Telehealth: Payer: Self-pay | Admitting: Pulmonary Disease

## 2020-06-27 NOTE — Telephone Encounter (Signed)
Called and spoke with patient about lab results and recommendations per Dr Craige Cotta. All questions answered and patient expressed full understanding of results and to continue using flonase, singulair & xyzal. Confirmed with patient office visit already scheduled for 08/16/2020 at 9:15am. Nothing further needed at this time.

## 2020-06-27 NOTE — Telephone Encounter (Signed)
CBC    Component Value Date/Time   WBC 9.4 06/21/2020 1028   RBC 4.92 06/21/2020 1028   HGB 14.7 06/21/2020 1028   HGB 16.4 01/19/2020 0807   HCT 45.5 06/21/2020 1028   HCT 48.9 01/19/2020 0807   PLT 168 06/21/2020 1028   PLT 169 01/19/2020 0807   MCV 92.5 06/21/2020 1028   MCV 90 01/19/2020 0807   MCH 29.9 06/21/2020 1028   MCHC 32.3 06/21/2020 1028   RDW 14.8 06/21/2020 1028   RDW 13.6 01/19/2020 0807   LYMPHSABS 1.1 06/21/2020 1028   LYMPHSABS 1.8 01/19/2020 0807   MONOABS 0.9 06/21/2020 1028   EOSABS 0.1 06/21/2020 1028   EOSABS 0.1 01/19/2020 0807   BASOSABS 0.0 06/21/2020 1028   BASOSABS 0.0 01/19/2020 0807    CMP Latest Ref Rng & Units 06/21/2020 01/19/2020 03/17/2019  Glucose 70 - 99 mg/dL 818(E) 993(Z) 169(C)  BUN 8 - 23 mg/dL 19 17 18   Creatinine 0.61 - 1.24 mg/dL ) 7.89(F) 8.10(F)  Sodium 135 - 145 mmol/L 139 142 143  Potassium 3.5 - 5.1 mmol/L 3.9 4.7 4.2  Chloride 98 - 111 mmol/L 105 104 105  CO2 22 - 32 mmol/L 24 22 22   Calcium 8.9 - 10.3 mg/dL 9.2 9.7 9.7  Total Protein 6.5 - 8.1 g/dL 7.2 7.1 7.2  Total Bilirubin 0.3 - 1.2 mg/dL 7.51(W) 0.8 0.5  Alkaline Phos 38 - 126 U/L 72 110 114  AST 15 - 41 U/L 20 22 18   ALT 0 - 44 U/L 17 14 15     RAST 06/21/20 >> IgE 636, dog, cat, cockroach, cedar    Please let him know that his blood test show he might have allergies to dog/cat dander, cockroaches, and cedar trees.  He should continue using flonase, singulair, and xyzal.

## 2020-06-30 ENCOUNTER — Ambulatory Visit (HOSPITAL_COMMUNITY): Payer: Medicare Other | Attending: Cardiology

## 2020-06-30 ENCOUNTER — Other Ambulatory Visit: Payer: Self-pay

## 2020-06-30 DIAGNOSIS — I5032 Chronic diastolic (congestive) heart failure: Secondary | ICD-10-CM | POA: Insufficient documentation

## 2020-06-30 DIAGNOSIS — R0602 Shortness of breath: Secondary | ICD-10-CM | POA: Diagnosis not present

## 2020-06-30 LAB — ECHOCARDIOGRAM COMPLETE
Area-P 1/2: 4.8 cm2
MV M vel: 4.4 m/s
MV Peak grad: 77.4 mmHg
S' Lateral: 5.9 cm

## 2020-06-30 MED ORDER — PERFLUTREN LIPID MICROSPHERE
1.0000 mL | INTRAVENOUS | Status: AC | PRN
  Administered 2020-06-30: 2 mL via INTRAVENOUS

## 2020-07-06 ENCOUNTER — Telehealth: Payer: Self-pay | Admitting: Cardiology

## 2020-07-06 DIAGNOSIS — Z79899 Other long term (current) drug therapy: Secondary | ICD-10-CM

## 2020-07-06 NOTE — Telephone Encounter (Signed)
Pt called in returning pams call about his echo results from 10/21  Best cb number - [phone]  [phone]

## 2020-07-06 NOTE — Telephone Encounter (Signed)
Can schedule follow up with me or APP next available if patient is still having symptoms.  Since he increased his diuretic he should get a basic metabolic profile

## 2020-07-06 NOTE — Telephone Encounter (Signed)
Patient aware he needs BMET per MD He was scheduled for OV on 07/13/20 with Hochrein MD with plans for labs this day, unless he has to cancel, at which case he will get done at [facility] on 11/4 while there for chest CT

## 2020-07-06 NOTE — Telephone Encounter (Signed)
Patient reports continued SOB but has improved some by taking lasix every day instead of every other day. Patient states he has lost about 5-6lbs in about a week  Upon review of echo report: 1. Left ventricular ejection fraction, by estimation, is <20%. The left  ventricle has severely decreased function. The left ventricle demonstrates  global hypokinesis. The left ventricular internal cavity size was  moderately dilated. Left ventricular  diastolic parameters are consistent with Grade II diastolic dysfunction  (pseudonormalization).  Will route to MD

## 2020-07-06 NOTE — Telephone Encounter (Signed)
Kevin Grave, RN  07/05/2020 5:08 PM EDT     Attempted to contact pt to make sure he doesn't have any questions after reviewing results on MyChart. Phone rang several times, was answered and call disconnected.    Kevin Rotunda, MD  07/02/2020 4:12 PM EDT     He has moderate MR. I will follow this clinically. No change in therapy. He has moderate pulmonary HTN which I will follow as well. Call [name] with the results and send results to Junie Spencer, FNP

## 2020-07-06 NOTE — Telephone Encounter (Signed)
Patient never called back regarding results  See phone note 10/15 and 10/27

## 2020-07-11 NOTE — Progress Notes (Signed)
Cardiology Office Note   Date:  07/13/2020   ID:  Kevin Flores, DOB [birth year], MRN [id number]  PCP:  Junie Spencer, FNP  Cardiologist:   Rollene Rotunda, MD   Chief Complaint  Patient presents with  . Shortness of Breath      History of Present Illness: Kevin Flores is a 79 y.o. male who presents follow up of CAD.   He has a history of CAD s/p CABG 1998, DES RCA 2001, DES PDA 2011 (LIMA-LAD & SVG-OM ok).   He was last admitted at [facility] 3/15-3/18/2019 for bradycardia and CHF exacerbation, weight at discharge 198 pounds.  His creatinine was 2.05 at discharge and  1.68 at recheck 3/22.  Myoview was intermediate risk but because of high risk of AKI, he was managed medically.   He had recent problems with cough and shortness of breath.  BNP level was elevated.  I ordered an echocardiogram which demonstrated that his ejection fraction which had been 50 to 55% previously was now down to 20%.  He had previous mild to moderate mitral regurgitation.  This is still moderate.  He was treated with some increased dose of diuretics recently and has felt much better.  He said he lost about 8 pounds.  Try to watch his salt.  His cough is still there a little bit but improved.  His breathing is much improved.  Denies any chest pressure, neck or arm discomfort.  He has had no new palpitations, presyncope or syncope.   Past Medical History:  Diagnosis Date  . Allergy    seasonal  . Arthritis   . CAD (coronary artery disease)    1998 LIMA to the LAD, SVG to circumflex. DES placed to the PDA 11/2009   . GERD with stricture   . Gout   . Heartburn   . Hyperlipidemia   . Hypertension   . Nephrolithiasis    left  . Personal history of colonic polyps    adenomas  . Thyroid disease     Past Surgical History:  Procedure Laterality Date  . BIOPSY PROSTATE    . cardiac stents  2001   and 73710  . COLONOSCOPY W/ POLYPECTOMY  2009   3 small adenomas  . CORONARY ARTERY BYPASS GRAFT  1998   2  bypass  . cysto with calculus manipulation    . UPPER GASTROINTESTINAL ENDOSCOPY       Current Outpatient Medications  Medication Sig Dispense Refill  . albuterol (VENTOLIN HFA) 108 (90 Base) MCG/ACT inhaler Inhale 2 puffs into the lungs every 6 (six) hours as needed for wheezing or shortness of breath. 8 g 2  . allopurinol (ZYLOPRIM) 300 MG tablet Take 1 tablet (300 mg total) by mouth daily. 90 tablet 3  . amLODipine (NORVASC) 10 MG tablet Take 1 tablet (10 mg total) by mouth daily. (Needs to be seen before next refill) 90 tablet 3  . aspirin 81 MG chewable tablet Chew 1 tablet (81 mg total) by mouth daily. 90 tablet 3  . atorvastatin (LIPITOR) 20 MG tablet Take 1 tablet (20 mg total) by mouth daily. 90 tablet 3  . budesonide-formoterol (SYMBICORT) 160-4.5 MCG/ACT inhaler Inhale 2 puffs into the lungs in the morning and at bedtime. 1 each 6  . Cholecalciferol (VITAMIN D-3 PO) Take 1 capsule by mouth daily.    . fish oil-omega-3 fatty acids 1000 MG capsule Take 1 g by mouth at bedtime.     . fluticasone (FLONASE) 50 MCG/ACT nasal  spray Place 1 spray into both nostrils daily. 16 g 6  . guaiFENesin (MUCINEX) 600 MG 12 hr tablet Take 2 tablets (1,200 mg total) by mouth 2 (two) times daily as needed for cough or to loosen phlegm.    Marland Kitchen levocetirizine (XYZAL) 5 MG tablet Take 1 tablet (5 mg total) by mouth every evening.    Marland Kitchen levothyroxine (SYNTHROID) 75 MCG tablet TAKE 1 TABLET BY MOUTH EVERY DAY 90 tablet 3  . montelukast (SINGULAIR) 10 MG tablet Take 1 tablet (10 mg total) by mouth at bedtime. 30 tablet 5  . pantoprazole (PROTONIX) 40 MG tablet TAKE 1 TABLET BY MOUTH EVERY DAY 90 tablet 0  . TURMERIC PO Take by mouth. Pt takes every other day    . vitamin C (ASCORBIC ACID) 500 MG tablet Take 500 mg by mouth daily.    . carvedilol (COREG) 12.5 MG tablet Take 1 tablet (12.5 mg total) by mouth 2 (two) times daily. 180 tablet 3  . furosemide (LASIX) 20 MG tablet TAKE 1 TABLET BY MOUTH EVERY OTHER  DAY 45 tablet 1   No current facility-administered medications for this visit.    Allergies:   Imdur [isosorbide dinitrate], Rosuvastatin, and Sulfonamide derivatives    ROS:  Please see the history of present illness.   Otherwise, review of systems are positive for none.   All other systems are reviewed and negative.    PHYSICAL EXAM: VS:  BP 124/66 (BP Location: Left Arm, Patient Position: Sitting)   Pulse 76   Ht 5\' 8"  (1.727 m)   Wt 193 lb 12.8 oz (87.9 kg)   SpO2 95%   BMI 29.47 kg/m  , BMI Body mass index is 29.47 kg/m.  GENERAL:  Well appearing NECK:  Positive jugular venous distention, waveform within normal limits with HJR, carotid upstroke brisk and symmetric, no bruits, no thyromegaly LUNGS:  Clear to auscultation bilaterally CHEST:  Unremarkable HEART:  PMI not displaced or sustained,S1 and S2 within normal limits, no S3, no S4, no clicks, no rubs, soft apical systolic murmur nonradiating, no diastolic murmurs ABD:  Flat, positive bowel sounds normal in frequency in pitch, no bruits, no rebound, no guarding, no midline pulsatile mass, no hepatomegaly, no splenomegaly EXT:  2 plus pulses throughout, mild bilateral ankle edema , no cyanosis no clubbing    EKG:  EKG is not ordered today.   Recent Labs: 01/19/2020: TSH 2.320 06/09/2020: BNP 774.8 06/21/2020: ALT 17; BUN 19; Creatinine, Ser 1.50; Hemoglobin 14.7; Platelets 168; Potassium 3.9; Sodium 139    Lipid Panel    Component Value Date/Time   CHOL 116 01/19/2020 0807   TRIG 76 01/19/2020 0807   HDL 34 (L) 01/19/2020 0807   CHOLHDL 3.4 01/19/2020 0807   CHOLHDL 4.7 11/18/2009 0347   VLDL 27 11/18/2009 0347   LDLCALC 66 01/19/2020 0807   LDLDIRECT 77 09/12/2017 1345      Wt Readings from Last 3 Encounters:  07/13/20 193 lb 12.8 oz (87.9 kg)  06/21/20 193 lb 6.4 oz (87.7 kg)  06/09/20 197 lb 12.8 oz (89.7 kg)      Other studies Reviewed: Additional studies/ records that were reviewed today include:  Labs Review of the above records demonstrates: See below   ASSESSMENT AND PLAN:  ACUTE SYSTOLIC HF:       The patient has a newly reduced ejection fraction.  He needs a right and left heart catheterization.  I will arrange this.  I would like him to hold his Lasix  the day before the day of the procedure and I talked given hopefully maybe next time when we can catch up and I do want to get okay thank you yes I does not want you to does not lunch either roll and not remember and I will try to drink water all night and but this raises think he will need any additional hydration.  We would need to avoid left ventriculogram and minimize diet because of his renal insufficiency.  I will check blood work today.  With out salt and fluid restriction.  Today I am going to discontinue his amlodipine and start carvedilol 6.25 mg twice daily.  After his catheterization we can carefully titrate ARB or ARNI watching his creatinine.  CAD: This will be evaluated at the time of his heart catheterization.  HTN:  The blood pressure is going to be controlled in the context of managing his heart failure.   MR: I will likely evaluate this with a TEE following the work-up above.   PVCs:   No change in therapy other than the beta-blocker.  He will be kept in consideration for an ICD after optimal medical therapy.  CKD IIIa:   I will check his basic metabolic profile today and we will watch this closely going forward as above.  Current medicines are reviewed at length with the patient today.  The patient does not have concerns regarding medicines.  The following changes have been made: As above  Labs/ tests ordered today include:   Orders Placed This Encounter  Procedures  . Basic metabolic panel  . CBC     Disposition:   FU with me after the cath   Signed, Rollene Rotunda, MD  07/13/2020 1:13 PM    [facility]

## 2020-07-11 NOTE — H&P (View-Only) (Signed)
Cardiology Office Note   Date:  07/13/2020   ID:  Kevin Flores, DOB [birth year], MRN [id number]  PCP:  Junie Spencer, FNP  Cardiologist:   Rollene Rotunda, MD   Chief Complaint  Patient presents with  . Shortness of Breath      History of Present Illness: Kevin Flores is a 79 y.o. male who presents follow up of CAD.   He has a history of CAD s/p CABG 1998, DES RCA 2001, DES PDA 2011 (LIMA-LAD & SVG-OM ok).   He was last admitted at [facility] 3/15-3/18/2019 for bradycardia and CHF exacerbation, weight at discharge 198 pounds.  His creatinine was 2.05 at discharge and  1.68 at recheck 3/22.  Myoview was intermediate risk but because of high risk of AKI, he was managed medically.   He had recent problems with cough and shortness of breath.  BNP level was elevated.  I ordered an echocardiogram which demonstrated that his ejection fraction which had been 50 to 55% previously was now down to 20%.  He had previous mild to moderate mitral regurgitation.  This is still moderate.  He was treated with some increased dose of diuretics recently and has felt much better.  He said he lost about 8 pounds.  Try to watch his salt.  His cough is still there a little bit but improved.  His breathing is much improved.  Denies any chest pressure, neck or arm discomfort.  He has had no new palpitations, presyncope or syncope.   Past Medical History:  Diagnosis Date  . Allergy    seasonal  . Arthritis   . CAD (coronary artery disease)    1998 LIMA to the LAD, SVG to circumflex. DES placed to the PDA 11/2009   . GERD with stricture   . Gout   . Heartburn   . Hyperlipidemia   . Hypertension   . Nephrolithiasis    left  . Personal history of colonic polyps    adenomas  . Thyroid disease     Past Surgical History:  Procedure Laterality Date  . BIOPSY PROSTATE    . cardiac stents  2001   and 73710  . COLONOSCOPY W/ POLYPECTOMY  2009   3 small adenomas  . CORONARY ARTERY BYPASS GRAFT  1998   2  bypass  . cysto with calculus manipulation    . UPPER GASTROINTESTINAL ENDOSCOPY       Current Outpatient Medications  Medication Sig Dispense Refill  . albuterol (VENTOLIN HFA) 108 (90 Base) MCG/ACT inhaler Inhale 2 puffs into the lungs every 6 (six) hours as needed for wheezing or shortness of breath. 8 g 2  . allopurinol (ZYLOPRIM) 300 MG tablet Take 1 tablet (300 mg total) by mouth daily. 90 tablet 3  . amLODipine (NORVASC) 10 MG tablet Take 1 tablet (10 mg total) by mouth daily. (Needs to be seen before next refill) 90 tablet 3  . aspirin 81 MG chewable tablet Chew 1 tablet (81 mg total) by mouth daily. 90 tablet 3  . atorvastatin (LIPITOR) 20 MG tablet Take 1 tablet (20 mg total) by mouth daily. 90 tablet 3  . budesonide-formoterol (SYMBICORT) 160-4.5 MCG/ACT inhaler Inhale 2 puffs into the lungs in the morning and at bedtime. 1 each 6  . Cholecalciferol (VITAMIN D-3 PO) Take 1 capsule by mouth daily.    . fish oil-omega-3 fatty acids 1000 MG capsule Take 1 g by mouth at bedtime.     . fluticasone (FLONASE) 50 MCG/ACT nasal  spray Place 1 spray into both nostrils daily. 16 g 6  . guaiFENesin (MUCINEX) 600 MG 12 hr tablet Take 2 tablets (1,200 mg total) by mouth 2 (two) times daily as needed for cough or to loosen phlegm.    Marland Kitchen levocetirizine (XYZAL) 5 MG tablet Take 1 tablet (5 mg total) by mouth every evening.    Marland Kitchen levothyroxine (SYNTHROID) 75 MCG tablet TAKE 1 TABLET BY MOUTH EVERY DAY 90 tablet 3  . montelukast (SINGULAIR) 10 MG tablet Take 1 tablet (10 mg total) by mouth at bedtime. 30 tablet 5  . pantoprazole (PROTONIX) 40 MG tablet TAKE 1 TABLET BY MOUTH EVERY DAY 90 tablet 0  . TURMERIC PO Take by mouth. Pt takes every other day    . vitamin C (ASCORBIC ACID) 500 MG tablet Take 500 mg by mouth daily.    . carvedilol (COREG) 12.5 MG tablet Take 1 tablet (12.5 mg total) by mouth 2 (two) times daily. 180 tablet 3  . furosemide (LASIX) 20 MG tablet TAKE 1 TABLET BY MOUTH EVERY OTHER  DAY 45 tablet 1   No current facility-administered medications for this visit.    Allergies:   Imdur [isosorbide dinitrate], Rosuvastatin, and Sulfonamide derivatives    ROS:  Please see the history of present illness.   Otherwise, review of systems are positive for none.   All other systems are reviewed and negative.    PHYSICAL EXAM: VS:  BP 124/66 (BP Location: Left Arm, Patient Position: Sitting)   Pulse 76   Ht 5\' 8"  (1.727 m)   Wt 193 lb 12.8 oz (87.9 kg)   SpO2 95%   BMI 29.47 kg/m  , BMI Body mass index is 29.47 kg/m.  GENERAL:  Well appearing NECK:  Positive jugular venous distention, waveform within normal limits with HJR, carotid upstroke brisk and symmetric, no bruits, no thyromegaly LUNGS:  Clear to auscultation bilaterally CHEST:  Unremarkable HEART:  PMI not displaced or sustained,S1 and S2 within normal limits, no S3, no S4, no clicks, no rubs, soft apical systolic murmur nonradiating, no diastolic murmurs ABD:  Flat, positive bowel sounds normal in frequency in pitch, no bruits, no rebound, no guarding, no midline pulsatile mass, no hepatomegaly, no splenomegaly EXT:  2 plus pulses throughout, mild bilateral ankle edema , no cyanosis no clubbing    EKG:  EKG is not ordered today.   Recent Labs: 01/19/2020: TSH 2.320 06/09/2020: BNP 774.8 06/21/2020: ALT 17; BUN 19; Creatinine, Ser 1.50; Hemoglobin 14.7; Platelets 168; Potassium 3.9; Sodium 139    Lipid Panel    Component Value Date/Time   CHOL 116 01/19/2020 0807   TRIG 76 01/19/2020 0807   HDL 34 (L) 01/19/2020 0807   CHOLHDL 3.4 01/19/2020 0807   CHOLHDL 4.7 11/18/2009 0347   VLDL 27 11/18/2009 0347   LDLCALC 66 01/19/2020 0807   LDLDIRECT 77 09/12/2017 1345      Wt Readings from Last 3 Encounters:  07/13/20 193 lb 12.8 oz (87.9 kg)  06/21/20 193 lb 6.4 oz (87.7 kg)  06/09/20 197 lb 12.8 oz (89.7 kg)      Other studies Reviewed: Additional studies/ records that were reviewed today include:  Labs Review of the above records demonstrates: See below   ASSESSMENT AND PLAN:  ACUTE SYSTOLIC HF:       The patient has a newly reduced ejection fraction.  He needs a right and left heart catheterization.  I will arrange this.  I would like him to hold his Lasix  the day before the day of the procedure and I talked given hopefully maybe next time when we can catch up and I do want to get okay thank you yes I does not want you to does not lunch either roll and not remember and I will try to drink water all night and but this raises think he will need any additional hydration.  We would need to avoid left ventriculogram and minimize diet because of his renal insufficiency.  I will check blood work today.  With out salt and fluid restriction.  Today I am going to discontinue his amlodipine and start carvedilol 6.25 mg twice daily.  After his catheterization we can carefully titrate ARB or ARNI watching his creatinine.  CAD: This will be evaluated at the time of his heart catheterization.  HTN:  The blood pressure is going to be controlled in the context of managing his heart failure.   MR: I will likely evaluate this with a TEE following the work-up above.   PVCs:   No change in therapy other than the beta-blocker.  He will be kept in consideration for an ICD after optimal medical therapy.  CKD IIIa:   I will check his basic metabolic profile today and we will watch this closely going forward as above.  Current medicines are reviewed at length with the patient today.  The patient does not have concerns regarding medicines.  The following changes have been made: As above  Labs/ tests ordered today include:   Orders Placed This Encounter  Procedures  . Basic metabolic panel  . CBC     Disposition:   FU with me after the cath   Signed, Rollene Rotunda, MD  07/13/2020 1:13 PM    [facility]

## 2020-07-13 ENCOUNTER — Other Ambulatory Visit: Payer: Self-pay

## 2020-07-13 ENCOUNTER — Ambulatory Visit (INDEPENDENT_AMBULATORY_CARE_PROVIDER_SITE_OTHER): Payer: Medicare Other | Admitting: Cardiology

## 2020-07-13 ENCOUNTER — Encounter: Payer: Self-pay | Admitting: Cardiology

## 2020-07-13 ENCOUNTER — Telehealth: Payer: Self-pay | Admitting: *Deleted

## 2020-07-13 VITALS — BP 124/66 | HR 76 | Ht 68.0 in | Wt 193.8 lb

## 2020-07-13 DIAGNOSIS — I493 Ventricular premature depolarization: Secondary | ICD-10-CM

## 2020-07-13 DIAGNOSIS — I10 Essential (primary) hypertension: Secondary | ICD-10-CM

## 2020-07-13 DIAGNOSIS — I2510 Atherosclerotic heart disease of native coronary artery without angina pectoris: Secondary | ICD-10-CM

## 2020-07-13 DIAGNOSIS — I5032 Chronic diastolic (congestive) heart failure: Secondary | ICD-10-CM | POA: Diagnosis not present

## 2020-07-13 DIAGNOSIS — I340 Nonrheumatic mitral (valve) insufficiency: Secondary | ICD-10-CM

## 2020-07-13 MED ORDER — CARVEDILOL 12.5 MG PO TABS: 12.5000 mg | ORAL_TABLET | Freq: Two times a day (BID) | ORAL | 3 refills | Status: DC

## 2020-07-13 NOTE — Telephone Encounter (Signed)
Patient was seen earlier today by Dr Antoine Poche.  Patient needed to discuss options with his job before proceeding with scheduling for right and left heart cath.  Patient calling back to give RN the day he would be available to have right and left heart cath .  patient would like to have  Procedure on Nov 17. 2021 and do  Covid testing on Nov 13 , 2021  patient aware will contact him later with the details.

## 2020-07-13 NOTE — Telephone Encounter (Signed)
Instructions given for upcoming cath , patient verbalized understanding. written instruction will be mailed

## 2020-07-13 NOTE — Patient Instructions (Addendum)
Medication Instructions:   start  Carvedilol 12.5 mg  Twice a day   Stop taking metoprolol  *If you need a refill on your cardiac medications before your next appointment, please call your pharmacy*   Lab Work: BMP CBC If you have labs (blood work) drawn today and your tests are completely normal, you will receive your results only by: Marland Kitchen MyChart Message (if you have MyChart) OR . A paper copy in the mail If you have any lab test that is abnormal or we need to change your treatment, we will call you to review the results.   Testing/Procedures:  will be schedule at  [address]  Your physician has requested that you have a cardiac catheterization. Cardiac catheterization is used to diagnose and/or treat various heart conditions. Doctors may recommend this procedure for a number of different reasons. The most common reason is to evaluate chest pain. Chest pain can be a symptom of coronary artery disease (CAD), and cardiac catheterization can show whether plaque is narrowing or blocking your heart's arteries. This procedure is also used to evaluate the valves, as well as measure the blood flow and oxygen levels in different parts of your heart. For further information please visit [email or web address] Please follow instruction sheet, as given.    Follow-Up: At [facility], you and your health needs are our priority.  As part of our continuing mission to provide you with exceptional heart care, we have created designated Provider Care Teams.  These Care Teams include your primary Cardiologist (physician) and Advanced Practice Providers (APPs -  Physician Assistants and Nurse Practitioners) who all work together to provide you with the care you need, when you need it.  We recommend signing up for the patient portal called "MyChart".  Sign up information is provided on this After Visit Summary.  MyChart is used to connect with patients for Virtual Visits (Telemedicine).  Patients  are able to view lab/test results, encounter notes, upcoming appointments, etc.  Non-urgent messages can be sent to your provider as well.   To learn more about what you can do with MyChart, go to [email or web address].    Your next appointment:   2 week(s)  The format for your next appointment:   In Person  Provider:   Rollene Rotunda, MD

## 2020-07-14 ENCOUNTER — Ambulatory Visit (HOSPITAL_COMMUNITY)
Admission: RE | Admit: 2020-07-14 | Discharge: 2020-07-14 | Disposition: A | Discharge status: Home / Self Care | Payer: Medicare Other | Source: Ambulatory Visit | Attending: Pulmonary Disease | Admitting: Pulmonary Disease

## 2020-07-14 DIAGNOSIS — J439 Emphysema, unspecified: Secondary | ICD-10-CM | POA: Diagnosis not present

## 2020-07-14 DIAGNOSIS — R053 Chronic cough: Secondary | ICD-10-CM | POA: Diagnosis not present

## 2020-07-14 DIAGNOSIS — R059 Cough, unspecified: Secondary | ICD-10-CM | POA: Diagnosis not present

## 2020-07-14 DIAGNOSIS — J342 Deviated nasal septum: Secondary | ICD-10-CM | POA: Insufficient documentation

## 2020-07-14 DIAGNOSIS — J90 Pleural effusion, not elsewhere classified: Secondary | ICD-10-CM | POA: Diagnosis not present

## 2020-07-14 DIAGNOSIS — J3489 Other specified disorders of nose and nasal sinuses: Secondary | ICD-10-CM | POA: Diagnosis not present

## 2020-07-14 DIAGNOSIS — R0609 Other forms of dyspnea: Secondary | ICD-10-CM | POA: Diagnosis not present

## 2020-07-14 MED ORDER — CARVEDILOL 12.5 MG PO TABS: 6.2500 mg | ORAL_TABLET | Freq: Two times a day (BID) | ORAL | 3 refills | Status: DC

## 2020-07-14 NOTE — Addendum Note (Signed)
Addended by: Tobin Chad on: 07/14/2020 06:31 PM   Modules accepted: Orders

## 2020-07-15 ENCOUNTER — Other Ambulatory Visit: Payer: Self-pay | Admitting: *Deleted

## 2020-07-15 DIAGNOSIS — I5032 Chronic diastolic (congestive) heart failure: Secondary | ICD-10-CM

## 2020-07-15 DIAGNOSIS — I493 Ventricular premature depolarization: Secondary | ICD-10-CM

## 2020-07-15 DIAGNOSIS — I340 Nonrheumatic mitral (valve) insufficiency: Secondary | ICD-10-CM

## 2020-07-15 NOTE — Telephone Encounter (Signed)
Late entry , spoke to Mrs Junker  @ 6 pm 07/14/20. medication adjustment from  Recent office visit  07/13/20.  RN poke Dr Antoine Poche for clarification of  Amlodipine and Carvedilol.  Per Dr Antoine Poche patient is to discontinue Amlodipine and Metoprolol   start taking  carvedilol 6.25 mg twice a day.  patient receive prescription for carvedilol 12.5 mg twice a day on 07/13/20 .  wife was made aware to  Inform patient to take 1/2 tablet of 12.5 mg  ( which total 6.25 mg )  Twice a day.   RN also gave information to patient on 07/13/20 by telephone call. Medication list updated but a new prescription was not sent to pharmacy .

## 2020-07-16 ENCOUNTER — Other Ambulatory Visit: Payer: Self-pay | Admitting: Pulmonary Disease

## 2020-07-19 ENCOUNTER — Other Ambulatory Visit: Payer: Medicare Other

## 2020-07-19 ENCOUNTER — Other Ambulatory Visit: Payer: Self-pay

## 2020-07-19 DIAGNOSIS — I493 Ventricular premature depolarization: Secondary | ICD-10-CM | POA: Diagnosis not present

## 2020-07-19 DIAGNOSIS — I2510 Atherosclerotic heart disease of native coronary artery without angina pectoris: Secondary | ICD-10-CM | POA: Diagnosis not present

## 2020-07-19 DIAGNOSIS — I5032 Chronic diastolic (congestive) heart failure: Secondary | ICD-10-CM | POA: Diagnosis not present

## 2020-07-19 DIAGNOSIS — I340 Nonrheumatic mitral (valve) insufficiency: Secondary | ICD-10-CM | POA: Diagnosis not present

## 2020-07-20 LAB — BASIC METABOLIC PANEL
BUN/Creatinine Ratio: 10 (ref 10–24)
BUN: 19 mg/dL (ref 8–27)
CO2: 24 mmol/L (ref 20–29)
Calcium: 10.1 mg/dL (ref 8.6–10.2)
Chloride: 103 mmol/L (ref 96–106)
Creatinine, Ser: 1.87 mg/dL — ABNORMAL HIGH (ref 0.76–1.27)
GFR calc Af Amer: 39 mL/min/{1.73_m2} — ABNORMAL LOW (ref >59)
GFR calc non Af Amer: 34 mL/min/{1.73_m2} — ABNORMAL LOW (ref >59)
Glucose: 95 mg/dL (ref 65–99)
Potassium: 4.7 mmol/L (ref 3.5–5.2)
Sodium: 144 mmol/L (ref 134–144)

## 2020-07-20 LAB — CBC
Hematocrit: 49 % (ref 37.5–51.0)
Hemoglobin: 16.3 g/dL (ref 13.0–17.7)
MCH: 30.3 pg (ref 26.6–33.0)
MCHC: 33.3 g/dL (ref 31.5–35.7)
MCV: 91 fL (ref 79–97)
Platelets: 165 10*3/uL (ref 150–450)
RBC: 5.38 x10E6/uL (ref 4.14–5.80)
RDW: 13.7 % (ref 11.6–15.4)
WBC: 9.3 10*3/uL (ref 3.4–10.8)

## 2020-07-21 ENCOUNTER — Telehealth: Payer: Self-pay | Admitting: Pulmonary Disease

## 2020-07-21 NOTE — Telephone Encounter (Signed)
CT sinus 07/14/20 >> leftward septal deviation and spurring  HRCT chest 07/14/20 >> CM, atherosclerosis, calcified plaque LM/LAD/LCX/RCA s/p CABG, mild diffuse GGO with interlobular septal thickening likely from edema, mild paraseptal emphysema, small b/l effusions   Please let him know his CT chest shows changes of heart failure.  He should proceed with plan as detailed by Dr. Antoine Poche with cardiology for heart catheterization.

## 2020-07-21 NOTE — Telephone Encounter (Signed)
Called and spoke with patient and wife, [name] (per DPR), about CT results per Dr Craige Cotta. Both patient and wife expressed full understanding and all questions answered. Patient stated he is going for his heart catherization next Wednesday. Confirmed upcoming scheduled appointment with Dr Craige Cotta for 08/16/2020 at 9:15 am.  Nothing further needed at this time.

## 2020-07-23 ENCOUNTER — Other Ambulatory Visit (HOSPITAL_COMMUNITY)
Admission: RE | Admit: 2020-07-23 | Discharge: 2020-07-23 | Disposition: A | Discharge status: Home / Self Care | Payer: Medicare Other | Source: Ambulatory Visit | Attending: Interventional Cardiology | Admitting: Interventional Cardiology

## 2020-07-23 DIAGNOSIS — Z01812 Encounter for preprocedural laboratory examination: Secondary | ICD-10-CM | POA: Insufficient documentation

## 2020-07-23 DIAGNOSIS — Z20822 Contact with and (suspected) exposure to covid-19: Secondary | ICD-10-CM | POA: Diagnosis not present

## 2020-07-23 LAB — SARS CORONAVIRUS 2 (TAT 6-24 HRS): SARS Coronavirus 2: NEGATIVE

## 2020-07-26 ENCOUNTER — Telehealth: Payer: Self-pay | Admitting: *Deleted

## 2020-07-26 NOTE — Telephone Encounter (Signed)
Pt contacted pre-catheterization scheduled at [facility] for: Wednesday July 27, 2020 10 AM Verified arrival time and place: [facility] Main Entrance A [facility]) at: 8 AM   No solid food after midnight prior to cath, clear liquids until 5 AM day of procedure.  Hold: Lasix- day before and day of procedure  Except hold medications AM meds can be  taken pre-cath with sips of water including: ASA 81 mg   Confirmed patient has responsible adult to drive home post procedure and be with patient first 24 hours after arriving home:yes  You are allowed ONE visitor in the waiting room during the time you are at the hospital for your procedure. Both you and your visitor must wear a mask once you enter the hospital.       COVID-19 Pre-Screening Questions:  . In the past 14 days have you had a new cough, new headache, new nasal congestion, fever (100.4 or greater) unexplained body aches, new sore throat, or sudden loss of taste or sense of smell? no . In the past 14 days have you been around anyone with known Covid 19? no   Reviewed procedure/mask/visitor instructions, COVID-19 questions with patient.  07/19/20 GFR 34-10/21/21 EF <20% Per Dr Antoine Poche 07/21/20: -hold lasix day before and day of procedure -no LV gram -does not recommend pre-procedure hydration

## 2020-07-27 ENCOUNTER — Encounter (HOSPITAL_COMMUNITY)
Admission: RE | Disposition: A | Discharge status: Home / Self Care | Payer: Self-pay | Source: Ambulatory Visit | Attending: Interventional Cardiology

## 2020-07-27 ENCOUNTER — Ambulatory Visit (HOSPITAL_BASED_OUTPATIENT_CLINIC_OR_DEPARTMENT_OTHER)
Admission: RE | Admit: 2020-07-27 | Discharge: 2020-07-27 | Disposition: A | Discharge status: Home / Self Care | Payer: Medicare Other | Source: Ambulatory Visit | Attending: Interventional Cardiology | Admitting: Interventional Cardiology

## 2020-07-27 ENCOUNTER — Other Ambulatory Visit: Payer: Self-pay

## 2020-07-27 DIAGNOSIS — Z79899 Other long term (current) drug therapy: Secondary | ICD-10-CM | POA: Insufficient documentation

## 2020-07-27 DIAGNOSIS — Z951 Presence of aortocoronary bypass graft: Secondary | ICD-10-CM | POA: Insufficient documentation

## 2020-07-27 DIAGNOSIS — Z882 Allergy status to sulfonamides status: Secondary | ICD-10-CM | POA: Insufficient documentation

## 2020-07-27 DIAGNOSIS — N1831 Chronic kidney disease, stage 3a: Secondary | ICD-10-CM | POA: Insufficient documentation

## 2020-07-27 DIAGNOSIS — I517 Cardiomegaly: Secondary | ICD-10-CM | POA: Diagnosis not present

## 2020-07-27 DIAGNOSIS — I5032 Chronic diastolic (congestive) heart failure: Secondary | ICD-10-CM

## 2020-07-27 DIAGNOSIS — I5021 Acute systolic (congestive) heart failure: Secondary | ICD-10-CM

## 2020-07-27 DIAGNOSIS — T82867A Thrombosis of cardiac prosthetic devices, implants and grafts, initial encounter: Secondary | ICD-10-CM | POA: Diagnosis not present

## 2020-07-27 DIAGNOSIS — Z515 Encounter for palliative care: Secondary | ICD-10-CM | POA: Diagnosis not present

## 2020-07-27 DIAGNOSIS — Z7982 Long term (current) use of aspirin: Secondary | ICD-10-CM | POA: Insufficient documentation

## 2020-07-27 DIAGNOSIS — I2510 Atherosclerotic heart disease of native coronary artery without angina pectoris: Secondary | ICD-10-CM | POA: Insufficient documentation

## 2020-07-27 DIAGNOSIS — R079 Chest pain, unspecified: Secondary | ICD-10-CM | POA: Diagnosis not present

## 2020-07-27 DIAGNOSIS — I472 Ventricular tachycardia: Secondary | ICD-10-CM | POA: Diagnosis not present

## 2020-07-27 DIAGNOSIS — I130 Hypertensive heart and chronic kidney disease with heart failure and stage 1 through stage 4 chronic kidney disease, or unspecified chronic kidney disease: Secondary | ICD-10-CM | POA: Insufficient documentation

## 2020-07-27 DIAGNOSIS — R570 Cardiogenic shock: Secondary | ICD-10-CM | POA: Diagnosis not present

## 2020-07-27 DIAGNOSIS — I213 ST elevation (STEMI) myocardial infarction of unspecified site: Secondary | ICD-10-CM | POA: Diagnosis not present

## 2020-07-27 DIAGNOSIS — Z66 Do not resuscitate: Secondary | ICD-10-CM | POA: Diagnosis not present

## 2020-07-27 DIAGNOSIS — Z20822 Contact with and (suspected) exposure to covid-19: Secondary | ICD-10-CM | POA: Diagnosis not present

## 2020-07-27 DIAGNOSIS — N170 Acute kidney failure with tubular necrosis: Secondary | ICD-10-CM | POA: Diagnosis not present

## 2020-07-27 DIAGNOSIS — I5023 Acute on chronic systolic (congestive) heart failure: Secondary | ICD-10-CM

## 2020-07-27 DIAGNOSIS — I2109 ST elevation (STEMI) myocardial infarction involving other coronary artery of anterior wall: Secondary | ICD-10-CM | POA: Diagnosis not present

## 2020-07-27 DIAGNOSIS — N390 Urinary tract infection, site not specified: Secondary | ICD-10-CM | POA: Diagnosis not present

## 2020-07-27 DIAGNOSIS — I82411 Acute embolism and thrombosis of right femoral vein: Secondary | ICD-10-CM | POA: Diagnosis not present

## 2020-07-27 HISTORY — PX: RIGHT/LEFT HEART CATH AND CORONARY/GRAFT ANGIOGRAPHY: CATH118267

## 2020-07-27 LAB — POCT I-STAT EG7
Acid-Base Excess: 0 mmol/L (ref 0.0–2.0)
Bicarbonate: 26.8 mmol/L (ref 20.0–28.0)
Calcium, Ion: 1.29 mmol/L (ref 1.15–1.40)
HCT: 46 % (ref 39.0–52.0)
Hemoglobin: 15.6 g/dL (ref 13.0–17.0)
O2 Saturation: 60 %
Potassium: 4.1 mmol/L (ref 3.5–5.1)
Sodium: 144 mmol/L (ref 135–145)
TCO2: 28 mmol/L (ref 22–32)
pCO2, Ven: 49.9 mmHg (ref 44.0–60.0)
pH, Ven: 7.338 (ref 7.250–7.430)
pO2, Ven: 34 mmHg (ref 32.0–45.0)

## 2020-07-27 LAB — POCT I-STAT 7, (LYTES, BLD GAS, ICA,H+H)
Acid-base deficit: 1 mmol/L (ref 0.0–2.0)
Bicarbonate: 24.5 mmol/L (ref 20.0–28.0)
Calcium, Ion: 1.26 mmol/L (ref 1.15–1.40)
HCT: 46 % (ref 39.0–52.0)
Hemoglobin: 15.6 g/dL (ref 13.0–17.0)
O2 Saturation: 94 %
Potassium: 4 mmol/L (ref 3.5–5.1)
Sodium: 143 mmol/L (ref 135–145)
TCO2: 26 mmol/L (ref 22–32)
pCO2 arterial: 43.6 mmHg (ref 32.0–48.0)
pH, Arterial: 7.357 (ref 7.350–7.450)
pO2, Arterial: 72 mmHg — ABNORMAL LOW (ref 83.0–108.0)

## 2020-07-27 SURGERY — RIGHT/LEFT HEART CATH AND CORONARY/GRAFT ANGIOGRAPHY
Anesthesia: LOCAL

## 2020-07-27 MED ORDER — LIDOCAINE HCL (PF) 1 % IJ SOLN
INTRAMUSCULAR | Status: AC
  Filled 2020-07-27: qty 30

## 2020-07-27 MED ORDER — HEPARIN (PORCINE) IN NACL 1000-0.9 UT/500ML-% IV SOLN
INTRAVENOUS | Status: AC
  Filled 2020-07-27: qty 1000

## 2020-07-27 MED ORDER — SODIUM CHLORIDE 0.9 % WEIGHT BASED INFUSION: 1.0000 mL/kg/h | INTRAVENOUS | Status: DC

## 2020-07-27 MED ORDER — FENTANYL CITRATE (PF) 100 MCG/2ML IJ SOLN
INTRAMUSCULAR | Status: AC
  Filled 2020-07-27: qty 2

## 2020-07-27 MED ORDER — SODIUM CHLORIDE 0.9 % WEIGHT BASED INFUSION
3.0000 mL/kg/h | INTRAVENOUS | Status: AC
  Administered 2020-07-27: 3 mL/kg/h via INTRAVENOUS

## 2020-07-27 MED ORDER — HYDRALAZINE HCL 20 MG/ML IJ SOLN: 10.0000 mg | INTRAMUSCULAR | Status: DC | PRN

## 2020-07-27 MED ORDER — HEPARIN SODIUM (PORCINE) 1000 UNIT/ML IJ SOLN
INTRAMUSCULAR | Status: AC
  Filled 2020-07-27: qty 1

## 2020-07-27 MED ORDER — SODIUM CHLORIDE 0.9 % IV SOLN: 250.0000 mL | INTRAVENOUS | Status: DC | PRN

## 2020-07-27 MED ORDER — LABETALOL HCL 5 MG/ML IV SOLN: 10.0000 mg | INTRAVENOUS | Status: DC | PRN

## 2020-07-27 MED ORDER — MIDAZOLAM HCL 2 MG/2ML IJ SOLN
INTRAMUSCULAR | Status: DC | PRN
  Administered 2020-07-27: 2 mg via INTRAVENOUS

## 2020-07-27 MED ORDER — HEPARIN (PORCINE) IN NACL 1000-0.9 UT/500ML-% IV SOLN
INTRAVENOUS | Status: DC | PRN
  Administered 2020-07-27 (×2): 500 mL

## 2020-07-27 MED ORDER — SODIUM CHLORIDE 0.9% FLUSH: 3.0000 mL | Freq: Two times a day (BID) | INTRAVENOUS | Status: DC

## 2020-07-27 MED ORDER — LIDOCAINE HCL (PF) 1 % IJ SOLN
INTRAMUSCULAR | Status: DC | PRN
  Administered 2020-07-27: 15 mL via SUBCUTANEOUS
  Administered 2020-07-27: 10 mL via INTRADERMAL

## 2020-07-27 MED ORDER — SODIUM CHLORIDE 0.9% FLUSH: 3.0000 mL | INTRAVENOUS | Status: DC | PRN

## 2020-07-27 MED ORDER — MIDAZOLAM HCL 2 MG/2ML IJ SOLN
INTRAMUSCULAR | Status: AC
  Filled 2020-07-27: qty 2

## 2020-07-27 MED ORDER — IOHEXOL 350 MG/ML SOLN
INTRAVENOUS | Status: DC | PRN
  Administered 2020-07-27: 50 mL

## 2020-07-27 MED ORDER — SODIUM CHLORIDE 0.9 % IV SOLN: INTRAVENOUS | Status: AC

## 2020-07-27 MED ORDER — ACETAMINOPHEN 325 MG PO TABS: 650.0000 mg | ORAL_TABLET | ORAL | Status: DC | PRN

## 2020-07-27 MED ORDER — FENTANYL CITRATE (PF) 100 MCG/2ML IJ SOLN
INTRAMUSCULAR | Status: DC | PRN
  Administered 2020-07-27: 25 ug via INTRAVENOUS

## 2020-07-27 MED ORDER — ONDANSETRON HCL 4 MG/2ML IJ SOLN: 4.0000 mg | Freq: Four times a day (QID) | INTRAMUSCULAR | Status: DC | PRN

## 2020-07-27 SURGICAL SUPPLY — 13 items
CATH 5FR JL3.5 JR4 ANG PIG MP (CATHETERS) ×1 IMPLANT
CATH INFINITI 5FR JL5 (CATHETERS) ×1 IMPLANT
CATH SWAN GANZ 7F STRAIGHT (CATHETERS) ×1 IMPLANT
KIT HEART LEFT (KITS) ×2 IMPLANT
PACK CARDIAC CATHETERIZATION (CUSTOM PROCEDURE TRAY) ×2 IMPLANT
SHEATH PINNACLE 5F 10CM (SHEATH) ×1 IMPLANT
SHEATH PINNACLE 7F 10CM (SHEATH) ×1 IMPLANT
SHEATH PROBE COVER 6X72 (BAG) ×1 IMPLANT
TRANSDUCER W/STOPCOCK (MISCELLANEOUS) ×2 IMPLANT
TUBING CIL FLEX 10 FLL-RA (TUBING) ×2 IMPLANT
WIRE EMERALD 3MM-J .025X260CM (WIRE) ×1 IMPLANT
WIRE EMERALD 3MM-J .035X150CM (WIRE) ×1 IMPLANT
WIRE HI TORQ VERSACORE-J 145CM (WIRE) ×1 IMPLANT

## 2020-07-27 NOTE — Progress Notes (Signed)
Discharge instructions reviewed with pt and his wife (via telephone) Both voice understanding.  

## 2020-07-27 NOTE — Research (Signed)
PHDEV Informed Consent   Subject Name: Kevin Flores  Subject met inclusion and exclusion criteria.  The informed consent form, study requirements and expectations were reviewed with the subject and questions and concerns were addressed prior to the signing of the consent form.  The subject verbalized understanding of the trail requirements.  The subject agreed to participate in the [facility] trial and signed the informed consent.  The informed consent was obtained prior to performance of any protocol-specific procedures for the subject.  A copy of the signed informed consent was given to the subject and a copy was placed in the subject's medical record.  [name]. 07/27/2020, 09:15 am

## 2020-07-27 NOTE — Progress Notes (Signed)
Site area: rt groin arterial and venous sheaths pulled by Lovell Sheehan Site Prior to Removal:  Level 0 Pressure Applied For: 20 minutes Manual:   yes Patient Status During Pull:  stable Post Pull Site:  Level 0 Post Pull Instructions Given:   yes Post Pull Pulses Present: rt dp 1+ Dressing Applied:  Gauze and tegaderm Bedrest begins @ 1130 Comments:

## 2020-07-27 NOTE — Progress Notes (Signed)
Ambulated in hallway and to bathroom to void tol well. No bleeding noted before or after ambulation.

## 2020-07-27 NOTE — Discharge Instructions (Signed)
Femoral Site Care This sheet gives you information about how to care for yourself after your procedure. Your health care provider may also give you more specific instructions. If you have problems or questions, contact your health care provider. What can I expect after the procedure? After the procedure, it is common to have:  Bruising that usually fades within 1-2 weeks.  Tenderness at the site. Follow these instructions at home: Wound care  Follow instructions from your health care provider about how to take care of your insertion site. Make sure you: ? Wash your hands with soap and water before you change your bandage (dressing). If soap and water are not available, use hand sanitizer. ? Change your dressing as told by your health care provider. ? Leave stitches (sutures), skin glue, or adhesive strips in place. These skin closures may need to stay in place for 2 weeks or longer. If adhesive strip edges start to loosen and curl up, you may trim the loose edges. Do not remove adhesive strips completely unless your health care provider tells you to do that.  Do not take baths, swim, or use a hot tub until your health care provider approves.  You may shower 24-48 hours after the procedure or as told by your health care provider. ? Gently wash the site with plain soap and water. ? Pat the area dry with a clean towel. ? Do not rub the site. This may cause bleeding.  Do not apply powder or lotion to the site. Keep the site clean and dry.  Check your femoral site every day for signs of infection. Check for: ? Redness, swelling, or pain. ? Fluid or blood. ? Warmth. ? Pus or a bad smell. Activity  For the first 2-3 days after your procedure, or as long as directed: ? Avoid climbing stairs as much as possible. ? Do not squat.  Do not lift anything that is heavier than 10 lb (4.5 kg), or the limit that you are told, until your health care provider says that it is safe.  Rest as  directed. ? Avoid sitting for a long time without moving. Get up to take short walks every 1-2 hours.  Do not drive for 24 hours if you were given a medicine to help you relax (sedative). General instructions  Take over-the-counter and prescription medicines only as told by your health care provider.  Keep all follow-up visits as told by your health care provider. This is important. Contact a health care provider if you have:  A fever or chills.  You have redness, swelling, or pain around your insertion site. Get help right away if:  The catheter insertion area swells very fast.  You pass out.  You suddenly start to sweat or your skin gets clammy.  The catheter insertion area is bleeding, and the bleeding does not stop when you hold steady pressure on the area.  The area near or just beyond the catheter insertion site becomes pale, cool, tingly, or numb. These symptoms may represent a serious problem that is an emergency. Do not wait to see if the symptoms will go away. Get medical help right away. Call your local emergency services (911 in the [place] Do not drive yourself to the hospital. Summary  After the procedure, it is common to have bruising that usually fades within 1-2 weeks.  Check your femoral site every day for signs of infection.  Do not lift anything that is heavier than 10 lb (4.5 kg), or the   limit that you are told, until your health care provider says that it is safe. This information is not intended to replace advice given to you by your health care provider. Make sure you discuss any questions you have with your health care provider. Document Revised: 09/09/2017 Document Reviewed: 09/09/2017 Elsevier Patient Education  2020 [employer]  

## 2020-07-27 NOTE — Interval H&P Note (Signed)
Cath Lab Visit (complete for each Cath Lab visit)  Clinical Evaluation Leading to the Procedure:   ACS: No.  Non-ACS:    Anginal Classification: CCS III  Anti-ischemic medical therapy: Maximal Therapy (2 or more classes of medications)  Non-Invasive Test Results: High-risk stress test findings: cardiac mortality >3%/year  Prior CABG: prior CABG  Low EF, down to 20% Minimize contrast due to renal dysfunction    History and Physical Interval Note:  07/27/2020 9:43 AM  Kevin Flores  has presented today for surgery, with the diagnosis of cardiomyopathy.  The various methods of treatment have been discussed with the patient and family. After consideration of risks, benefits and other options for treatment, the patient has consented to  Procedure(s): RIGHT/LEFT HEART CATH AND CORONARY/GRAFT ANGIOGRAPHY (N/A) as a surgical intervention.  The patient's history has been reviewed, patient examined, no change in status, stable for surgery.  I have reviewed the patient's chart and labs.  Questions were answered to the patient's satisfaction.     Lance Muss

## 2020-07-28 ENCOUNTER — Encounter (HOSPITAL_COMMUNITY): Payer: Self-pay | Admitting: Interventional Cardiology

## 2020-07-28 ENCOUNTER — Ambulatory Visit (HOSPITAL_COMMUNITY): Admit: 2020-07-28 | Payer: Medicare Other | Admitting: Cardiovascular Disease

## 2020-07-28 ENCOUNTER — Encounter (HOSPITAL_COMMUNITY)
Admission: EM | Disposition: A | Discharge status: Hospice | Payer: Self-pay | Source: Home / Self Care | Attending: Cardiovascular Disease

## 2020-07-28 ENCOUNTER — Other Ambulatory Visit: Payer: Self-pay

## 2020-07-28 ENCOUNTER — Inpatient Hospital Stay (HOSPITAL_COMMUNITY)
Admission: EM | Admit: 2020-07-28 | Discharge: 2020-08-11 | DRG: 250 | Disposition: A | Discharge status: Hospice | Payer: Medicare Other | Attending: Cardiovascular Disease | Admitting: Cardiovascular Disease

## 2020-07-28 ENCOUNTER — Telehealth: Payer: Self-pay | Admitting: Cardiology

## 2020-07-28 ENCOUNTER — Emergency Department (HOSPITAL_COMMUNITY): Payer: Medicare Other

## 2020-07-28 ENCOUNTER — Other Ambulatory Visit (HOSPITAL_COMMUNITY): Payer: Medicare Other

## 2020-07-28 DIAGNOSIS — J438 Other emphysema: Secondary | ICD-10-CM | POA: Diagnosis present

## 2020-07-28 DIAGNOSIS — Z7982 Long term (current) use of aspirin: Secondary | ICD-10-CM

## 2020-07-28 DIAGNOSIS — I77810 Thoracic aortic ectasia: Secondary | ICD-10-CM | POA: Diagnosis present

## 2020-07-28 DIAGNOSIS — Z7951 Long term (current) use of inhaled steroids: Secondary | ICD-10-CM | POA: Diagnosis not present

## 2020-07-28 DIAGNOSIS — J40 Bronchitis, not specified as acute or chronic: Secondary | ICD-10-CM | POA: Diagnosis not present

## 2020-07-28 DIAGNOSIS — I25810 Atherosclerosis of coronary artery bypass graft(s) without angina pectoris: Secondary | ICD-10-CM

## 2020-07-28 DIAGNOSIS — Z79899 Other long term (current) drug therapy: Secondary | ICD-10-CM

## 2020-07-28 DIAGNOSIS — I2510 Atherosclerotic heart disease of native coronary artery without angina pectoris: Secondary | ICD-10-CM | POA: Diagnosis present

## 2020-07-28 DIAGNOSIS — Z833 Family history of diabetes mellitus: Secondary | ICD-10-CM

## 2020-07-28 DIAGNOSIS — I2582 Chronic total occlusion of coronary artery: Secondary | ICD-10-CM | POA: Diagnosis present

## 2020-07-28 DIAGNOSIS — Z79890 Hormone replacement therapy (postmenopausal): Secondary | ICD-10-CM

## 2020-07-28 DIAGNOSIS — I5023 Acute on chronic systolic (congestive) heart failure: Secondary | ICD-10-CM

## 2020-07-28 DIAGNOSIS — I82401 Acute embolism and thrombosis of unspecified deep veins of right lower extremity: Secondary | ICD-10-CM | POA: Diagnosis not present

## 2020-07-28 DIAGNOSIS — I2102 ST elevation (STEMI) myocardial infarction involving left anterior descending coronary artery: Secondary | ICD-10-CM | POA: Diagnosis not present

## 2020-07-28 DIAGNOSIS — R040 Epistaxis: Secondary | ICD-10-CM | POA: Diagnosis not present

## 2020-07-28 DIAGNOSIS — Z803 Family history of malignant neoplasm of breast: Secondary | ICD-10-CM

## 2020-07-28 DIAGNOSIS — Z8249 Family history of ischemic heart disease and other diseases of the circulatory system: Secondary | ICD-10-CM

## 2020-07-28 DIAGNOSIS — R002 Palpitations: Secondary | ICD-10-CM | POA: Diagnosis not present

## 2020-07-28 DIAGNOSIS — N281 Cyst of kidney, acquired: Secondary | ICD-10-CM | POA: Diagnosis not present

## 2020-07-28 DIAGNOSIS — I472 Ventricular tachycardia: Secondary | ICD-10-CM | POA: Diagnosis not present

## 2020-07-28 DIAGNOSIS — Z66 Do not resuscitate: Secondary | ICD-10-CM | POA: Diagnosis not present

## 2020-07-28 DIAGNOSIS — N170 Acute kidney failure with tubular necrosis: Secondary | ICD-10-CM | POA: Diagnosis present

## 2020-07-28 DIAGNOSIS — I509 Heart failure, unspecified: Secondary | ICD-10-CM | POA: Diagnosis not present

## 2020-07-28 DIAGNOSIS — Y832 Surgical operation with anastomosis, bypass or graft as the cause of abnormal reaction of the patient, or of later complication, without mention of misadventure at the time of the procedure: Secondary | ICD-10-CM | POA: Diagnosis present

## 2020-07-28 DIAGNOSIS — I130 Hypertensive heart and chronic kidney disease with heart failure and stage 1 through stage 4 chronic kidney disease, or unspecified chronic kidney disease: Secondary | ICD-10-CM | POA: Diagnosis present

## 2020-07-28 DIAGNOSIS — I340 Nonrheumatic mitral (valve) insufficiency: Secondary | ICD-10-CM | POA: Diagnosis not present

## 2020-07-28 DIAGNOSIS — I213 ST elevation (STEMI) myocardial infarction of unspecified site: Secondary | ICD-10-CM | POA: Diagnosis not present

## 2020-07-28 DIAGNOSIS — T82867A Thrombosis of cardiac prosthetic devices, implants and grafts, initial encounter: Secondary | ICD-10-CM | POA: Diagnosis not present

## 2020-07-28 DIAGNOSIS — R54 Age-related physical debility: Secondary | ICD-10-CM | POA: Diagnosis present

## 2020-07-28 DIAGNOSIS — I2109 ST elevation (STEMI) myocardial infarction involving other coronary artery of anterior wall: Secondary | ICD-10-CM | POA: Diagnosis present

## 2020-07-28 DIAGNOSIS — N1832 Chronic kidney disease, stage 3b: Secondary | ICD-10-CM | POA: Diagnosis not present

## 2020-07-28 DIAGNOSIS — N200 Calculus of kidney: Secondary | ICD-10-CM | POA: Diagnosis not present

## 2020-07-28 DIAGNOSIS — R188 Other ascites: Secondary | ICD-10-CM

## 2020-07-28 DIAGNOSIS — E039 Hypothyroidism, unspecified: Secondary | ICD-10-CM | POA: Diagnosis not present

## 2020-07-28 DIAGNOSIS — R570 Cardiogenic shock: Secondary | ICD-10-CM | POA: Diagnosis not present

## 2020-07-28 DIAGNOSIS — I495 Sick sinus syndrome: Secondary | ICD-10-CM | POA: Diagnosis not present

## 2020-07-28 DIAGNOSIS — I499 Cardiac arrhythmia, unspecified: Secondary | ICD-10-CM | POA: Diagnosis not present

## 2020-07-28 DIAGNOSIS — E785 Hyperlipidemia, unspecified: Secondary | ICD-10-CM | POA: Diagnosis not present

## 2020-07-28 DIAGNOSIS — R079 Chest pain, unspecified: Secondary | ICD-10-CM | POA: Diagnosis not present

## 2020-07-28 DIAGNOSIS — I517 Cardiomegaly: Secondary | ICD-10-CM | POA: Diagnosis not present

## 2020-07-28 DIAGNOSIS — I5022 Chronic systolic (congestive) heart failure: Secondary | ICD-10-CM | POA: Diagnosis not present

## 2020-07-28 DIAGNOSIS — Z515 Encounter for palliative care: Secondary | ICD-10-CM | POA: Diagnosis not present

## 2020-07-28 DIAGNOSIS — Z20822 Contact with and (suspected) exposure to covid-19: Secondary | ICD-10-CM | POA: Diagnosis present

## 2020-07-28 DIAGNOSIS — R609 Edema, unspecified: Secondary | ICD-10-CM | POA: Diagnosis not present

## 2020-07-28 DIAGNOSIS — D696 Thrombocytopenia, unspecified: Secondary | ICD-10-CM | POA: Diagnosis not present

## 2020-07-28 DIAGNOSIS — Z7189 Other specified counseling: Secondary | ICD-10-CM | POA: Diagnosis not present

## 2020-07-28 DIAGNOSIS — K219 Gastro-esophageal reflux disease without esophagitis: Secondary | ICD-10-CM | POA: Diagnosis not present

## 2020-07-28 DIAGNOSIS — R0602 Shortness of breath: Secondary | ICD-10-CM

## 2020-07-28 DIAGNOSIS — N181 Chronic kidney disease, stage 1: Secondary | ICD-10-CM | POA: Diagnosis not present

## 2020-07-28 DIAGNOSIS — R059 Cough, unspecified: Secondary | ICD-10-CM | POA: Diagnosis not present

## 2020-07-28 DIAGNOSIS — Z955 Presence of coronary angioplasty implant and graft: Secondary | ICD-10-CM

## 2020-07-28 DIAGNOSIS — G4700 Insomnia, unspecified: Secondary | ICD-10-CM | POA: Diagnosis not present

## 2020-07-28 DIAGNOSIS — I351 Nonrheumatic aortic (valve) insufficiency: Secondary | ICD-10-CM | POA: Diagnosis not present

## 2020-07-28 DIAGNOSIS — N390 Urinary tract infection, site not specified: Secondary | ICD-10-CM | POA: Diagnosis not present

## 2020-07-28 DIAGNOSIS — Z452 Encounter for adjustment and management of vascular access device: Secondary | ICD-10-CM | POA: Diagnosis not present

## 2020-07-28 DIAGNOSIS — I493 Ventricular premature depolarization: Secondary | ICD-10-CM | POA: Diagnosis not present

## 2020-07-28 DIAGNOSIS — I82411 Acute embolism and thrombosis of right femoral vein: Secondary | ICD-10-CM | POA: Diagnosis not present

## 2020-07-28 DIAGNOSIS — R21 Rash and other nonspecific skin eruption: Secondary | ICD-10-CM | POA: Diagnosis not present

## 2020-07-28 DIAGNOSIS — Z800 Family history of malignant neoplasm of digestive organs: Secondary | ICD-10-CM

## 2020-07-28 DIAGNOSIS — R0689 Other abnormalities of breathing: Secondary | ICD-10-CM | POA: Diagnosis not present

## 2020-07-28 HISTORY — PX: CORONARY/GRAFT ACUTE MI REVASCULARIZATION: CATH118305

## 2020-07-28 LAB — COMPREHENSIVE METABOLIC PANEL
ALT: 17 U/L (ref 0–44)
AST: 25 U/L (ref 15–41)
Albumin: 3.5 g/dL (ref 3.5–5.0)
Alkaline Phosphatase: 84 U/L (ref 38–126)
Anion gap: 11 (ref 5–15)
BUN: 19 mg/dL (ref 8–23)
CO2: 22 mmol/L (ref 22–32)
Calcium: 9.4 mg/dL (ref 8.9–10.3)
Chloride: 106 mmol/L (ref 98–111)
Creatinine, Ser: 1.73 mg/dL — ABNORMAL HIGH (ref 0.61–1.24)
GFR, Estimated: 40 mL/min — ABNORMAL LOW (ref >60)
Glucose, Bld: 144 mg/dL — ABNORMAL HIGH (ref 70–99)
Potassium: 4.2 mmol/L (ref 3.5–5.1)
Sodium: 139 mmol/L (ref 135–145)
Total Bilirubin: 1.3 mg/dL — ABNORMAL HIGH (ref 0.3–1.2)
Total Protein: 7.3 g/dL (ref 6.5–8.1)

## 2020-07-28 LAB — CBC WITH DIFFERENTIAL/PLATELET
Abs Immature Granulocytes: 0.03 10*3/uL (ref 0.00–0.07)
Basophils Absolute: 0 10*3/uL (ref 0.0–0.1)
Basophils Relative: 1 %
Eosinophils Absolute: 0.1 10*3/uL (ref 0.0–0.5)
Eosinophils Relative: 1 %
HCT: 50 % (ref 39.0–52.0)
Hemoglobin: 16 g/dL (ref 13.0–17.0)
Immature Granulocytes: 0 %
Lymphocytes Relative: 18 %
Lymphs Abs: 1.5 10*3/uL (ref 0.7–4.0)
MCH: 30.2 pg (ref 26.0–34.0)
MCHC: 32 g/dL (ref 30.0–36.0)
MCV: 94.3 fL (ref 80.0–100.0)
Monocytes Absolute: 0.9 10*3/uL (ref 0.1–1.0)
Monocytes Relative: 10 %
Neutro Abs: 6.1 10*3/uL (ref 1.7–7.7)
Neutrophils Relative %: 70 %
Platelets: 171 10*3/uL (ref 150–400)
RBC: 5.3 MIL/uL (ref 4.22–5.81)
RDW: 14.9 % (ref 11.5–15.5)
WBC: 8.7 10*3/uL (ref 4.0–10.5)
nRBC: 0 % (ref 0.0–0.2)

## 2020-07-28 LAB — LIPID PANEL
Cholesterol: 98 mg/dL (ref 0–200)
HDL: 35 mg/dL — ABNORMAL LOW (ref >40)
LDL Cholesterol: 51 mg/dL (ref 0–99)
Total CHOL/HDL Ratio: 2.8 RATIO
Triglycerides: 59 mg/dL (ref <150)
VLDL: 12 mg/dL (ref 0–40)

## 2020-07-28 LAB — TROPONIN I (HIGH SENSITIVITY)
Troponin I (High Sensitivity): 18 ng/L — ABNORMAL HIGH (ref <18)
Troponin I (High Sensitivity): 348 ng/L (ref <18)

## 2020-07-28 LAB — RESPIRATORY PANEL BY RT PCR (FLU A&B, COVID)
Influenza A by PCR: NEGATIVE
Influenza B by PCR: NEGATIVE
SARS Coronavirus 2 by RT PCR: NEGATIVE

## 2020-07-28 LAB — POCT ACTIVATED CLOTTING TIME
Activated Clotting Time: 285 seconds
Activated Clotting Time: 219 seconds

## 2020-07-28 LAB — PROTIME-INR
INR: 1.2 (ref 0.8–1.2)
Prothrombin Time: 14.5 seconds (ref 11.4–15.2)

## 2020-07-28 LAB — HEMOGLOBIN A1C
Hgb A1c MFr Bld: 5.7 % — ABNORMAL HIGH (ref 4.8–5.6)
Mean Plasma Glucose: 116.89 mg/dL

## 2020-07-28 LAB — APTT: aPTT: 31 seconds (ref 24–36)

## 2020-07-28 SURGERY — CORONARY/GRAFT ACUTE MI REVASCULARIZATION
Anesthesia: LOCAL

## 2020-07-28 MED ORDER — HEPARIN SODIUM (PORCINE) 1000 UNIT/ML IJ SOLN
INTRAMUSCULAR | Status: DC | PRN
  Administered 2020-07-28: 5000 [IU] via INTRAVENOUS

## 2020-07-28 MED ORDER — MONTELUKAST SODIUM 10 MG PO TABS
10.0000 mg | ORAL_TABLET | Freq: Every day | ORAL | Status: DC
  Administered 2020-07-28 – 2020-08-10 (×14): 10 mg via ORAL
  Filled 2020-07-28 (×14): qty 1

## 2020-07-28 MED ORDER — SODIUM CHLORIDE 0.9 % IV SOLN: 250.0000 mL | INTRAVENOUS | Status: DC | PRN

## 2020-07-28 MED ORDER — IOHEXOL 350 MG/ML SOLN
INTRAVENOUS | Status: DC | PRN
  Administered 2020-07-28: 100 mL

## 2020-07-28 MED ORDER — SODIUM CHLORIDE 0.9 % IV SOLN: INTRAVENOUS | Status: AC

## 2020-07-28 MED ORDER — FLUTICASONE PROPIONATE 50 MCG/ACT NA SUSP
1.0000 | Freq: Every day | NASAL | Status: DC
  Administered 2020-07-29 – 2020-08-11 (×13): 1 via NASAL
  Filled 2020-07-28: qty 16

## 2020-07-28 MED ORDER — CARBOXYMETHYLCELLUL-GLYCERIN 0.5-0.9 % OP SOLN: 1.0000 [drp] | Freq: Every day | OPHTHALMIC | Status: DC | PRN

## 2020-07-28 MED ORDER — CLOPIDOGREL BISULFATE 75 MG PO TABS
75.0000 mg | ORAL_TABLET | Freq: Every day | ORAL | Status: DC
  Administered 2020-07-29 – 2020-07-30 (×2): 75 mg via ORAL
  Filled 2020-07-28 (×2): qty 1

## 2020-07-28 MED ORDER — MORPHINE SULFATE (PF) 2 MG/ML IV SOLN
2.0000 mg | INTRAVENOUS | Status: DC | PRN
  Administered 2020-07-28 – 2020-07-29 (×5): 2 mg via INTRAVENOUS
  Filled 2020-07-28 (×4): qty 1

## 2020-07-28 MED ORDER — LORATADINE 10 MG PO TABS
10.0000 mg | ORAL_TABLET | Freq: Every evening | ORAL | Status: DC
  Administered 2020-07-28 – 2020-08-10 (×14): 10 mg via ORAL
  Filled 2020-07-28 (×14): qty 1

## 2020-07-28 MED ORDER — LIDOCAINE HCL (PF) 1 % IJ SOLN
INTRAMUSCULAR | Status: DC | PRN
  Administered 2020-07-28: 2 mL

## 2020-07-28 MED ORDER — SODIUM CHLORIDE 0.9 % IV SOLN: INTRAVENOUS | Status: DC

## 2020-07-28 MED ORDER — CARVEDILOL 6.25 MG PO TABS
6.2500 mg | ORAL_TABLET | Freq: Two times a day (BID) | ORAL | Status: DC
  Administered 2020-07-28 – 2020-07-29 (×3): 6.25 mg via ORAL
  Filled 2020-07-28 (×3): qty 1

## 2020-07-28 MED ORDER — VERAPAMIL HCL 2.5 MG/ML IV SOLN
INTRAVENOUS | Status: DC | PRN
  Administered 2020-07-28: 10 mL via INTRA_ARTERIAL

## 2020-07-28 MED ORDER — ACETAMINOPHEN 325 MG PO TABS
650.0000 mg | ORAL_TABLET | ORAL | Status: DC | PRN
  Administered 2020-07-29: 650 mg via ORAL
  Filled 2020-07-28: qty 2

## 2020-07-28 MED ORDER — HEPARIN (PORCINE) 25000 UT/250ML-% IV SOLN
1400.0000 [IU]/h | INTRAVENOUS | Status: AC
  Administered 2020-07-28 – 2020-07-30 (×3): 1200 [IU]/h via INTRAVENOUS
  Filled 2020-07-28 (×3): qty 250

## 2020-07-28 MED ORDER — LEVOCETIRIZINE DIHYDROCHLORIDE 5 MG PO TABS: 5.0000 mg | ORAL_TABLET | Freq: Every evening | ORAL | Status: DC

## 2020-07-28 MED ORDER — LIDOCAINE HCL (PF) 1 % IJ SOLN
INTRAMUSCULAR | Status: AC
  Filled 2020-07-28: qty 30

## 2020-07-28 MED ORDER — CLOPIDOGREL BISULFATE 300 MG PO TABS
ORAL_TABLET | ORAL | Status: AC
  Filled 2020-07-28: qty 1

## 2020-07-28 MED ORDER — LABETALOL HCL 5 MG/ML IV SOLN: 10.0000 mg | INTRAVENOUS | Status: AC | PRN

## 2020-07-28 MED ORDER — VERAPAMIL HCL 2.5 MG/ML IV SOLN
INTRAVENOUS | Status: AC
  Filled 2020-07-28: qty 2

## 2020-07-28 MED ORDER — HYDRALAZINE HCL 20 MG/ML IJ SOLN: 10.0000 mg | INTRAMUSCULAR | Status: AC | PRN

## 2020-07-28 MED ORDER — MORPHINE SULFATE (PF) 2 MG/ML IV SOLN
INTRAVENOUS | Status: AC
  Filled 2020-07-28: qty 1

## 2020-07-28 MED ORDER — SODIUM CHLORIDE 0.9% FLUSH
3.0000 mL | Freq: Two times a day (BID) | INTRAVENOUS | Status: DC
  Administered 2020-07-28 – 2020-08-11 (×25): 3 mL via INTRAVENOUS

## 2020-07-28 MED ORDER — HEPARIN SODIUM (PORCINE) 5000 UNIT/ML IJ SOLN
4000.0000 [IU] | Freq: Once | INTRAMUSCULAR | Status: AC
  Administered 2020-07-28: 4000 [IU] via INTRAVENOUS
  Filled 2020-07-28: qty 1

## 2020-07-28 MED ORDER — OXYCODONE HCL 5 MG PO TABS
5.0000 mg | ORAL_TABLET | ORAL | Status: DC | PRN
  Administered 2020-08-01: 5 mg via ORAL
  Administered 2020-08-05: 10 mg via ORAL
  Filled 2020-07-28: qty 2
  Filled 2020-07-28: qty 1

## 2020-07-28 MED ORDER — LEVOTHYROXINE SODIUM 75 MCG PO TABS
75.0000 ug | ORAL_TABLET | Freq: Every day | ORAL | Status: DC
  Administered 2020-07-29 – 2020-08-11 (×14): 75 ug via ORAL
  Filled 2020-07-28 (×14): qty 1

## 2020-07-28 MED ORDER — CLOPIDOGREL BISULFATE 300 MG PO TABS
300.0000 mg | ORAL_TABLET | Freq: Once | ORAL | Status: AC
  Administered 2020-07-28: 300 mg via ORAL

## 2020-07-28 MED ORDER — ATORVASTATIN CALCIUM 10 MG PO TABS
20.0000 mg | ORAL_TABLET | Freq: Every day | ORAL | Status: DC
  Filled 2020-07-28: qty 2

## 2020-07-28 MED ORDER — IOHEXOL 350 MG/ML SOLN
INTRAVENOUS | Status: AC
  Filled 2020-07-28: qty 1

## 2020-07-28 MED ORDER — PANTOPRAZOLE SODIUM 40 MG PO TBEC
40.0000 mg | DELAYED_RELEASE_TABLET | Freq: Every day | ORAL | Status: DC
  Administered 2020-07-28 – 2020-08-11 (×15): 40 mg via ORAL
  Filled 2020-07-28 (×15): qty 1

## 2020-07-28 MED ORDER — FENTANYL CITRATE (PF) 100 MCG/2ML IJ SOLN
INTRAMUSCULAR | Status: DC | PRN
  Administered 2020-07-28 (×2): 25 ug via INTRAVENOUS

## 2020-07-28 MED ORDER — SODIUM CHLORIDE 0.9% FLUSH: 3.0000 mL | INTRAVENOUS | Status: DC | PRN

## 2020-07-28 MED ORDER — HEPARIN SODIUM (PORCINE) 1000 UNIT/ML IJ SOLN
INTRAMUSCULAR | Status: AC
  Filled 2020-07-28: qty 1

## 2020-07-28 MED ORDER — HEPARIN (PORCINE) IN NACL 1000-0.9 UT/500ML-% IV SOLN
INTRAVENOUS | Status: AC
  Filled 2020-07-28: qty 1000

## 2020-07-28 MED ORDER — ASPIRIN 81 MG PO CHEW: 324.0000 mg | CHEWABLE_TABLET | Freq: Once | ORAL | Status: DC

## 2020-07-28 MED ORDER — FENTANYL CITRATE (PF) 100 MCG/2ML IJ SOLN
INTRAMUSCULAR | Status: AC
  Filled 2020-07-28: qty 2

## 2020-07-28 MED ORDER — ONDANSETRON HCL 4 MG/2ML IJ SOLN: 4.0000 mg | Freq: Four times a day (QID) | INTRAMUSCULAR | Status: DC | PRN

## 2020-07-28 MED ORDER — MIDAZOLAM HCL 2 MG/2ML IJ SOLN
INTRAMUSCULAR | Status: DC | PRN
  Administered 2020-07-28 (×2): 1 mg via INTRAVENOUS

## 2020-07-28 MED ORDER — ALLOPURINOL 300 MG PO TABS
300.0000 mg | ORAL_TABLET | Freq: Every day | ORAL | Status: DC
  Administered 2020-07-29 – 2020-08-11 (×14): 300 mg via ORAL
  Filled 2020-07-28 (×14): qty 1

## 2020-07-28 MED ORDER — ASPIRIN 81 MG PO CHEW: 81.0000 mg | CHEWABLE_TABLET | Freq: Every day | ORAL | Status: DC

## 2020-07-28 MED ORDER — ASPIRIN 81 MG PO CHEW
81.0000 mg | CHEWABLE_TABLET | Freq: Every day | ORAL | Status: DC
  Administered 2020-07-29 – 2020-08-11 (×14): 81 mg via ORAL
  Filled 2020-07-28 (×14): qty 1

## 2020-07-28 MED ORDER — MIDAZOLAM HCL 2 MG/2ML IJ SOLN
INTRAMUSCULAR | Status: AC
  Filled 2020-07-28: qty 2

## 2020-07-28 MED ORDER — HEPARIN (PORCINE) IN NACL 1000-0.9 UT/500ML-% IV SOLN
INTRAVENOUS | Status: DC | PRN
  Administered 2020-07-28 (×2): 500 mL

## 2020-07-28 MED ORDER — MOMETASONE FURO-FORMOTEROL FUM 200-5 MCG/ACT IN AERO
2.0000 | INHALATION_SPRAY | Freq: Two times a day (BID) | RESPIRATORY_TRACT | Status: DC
  Administered 2020-07-28 – 2020-08-11 (×27): 2 via RESPIRATORY_TRACT
  Filled 2020-07-28: qty 8.8

## 2020-07-28 SURGICAL SUPPLY — 22 items
BALLN SAPPHIRE 2.5X12 (BALLOONS) ×2
BALLOON SAPPHIRE 2.5X12 (BALLOONS) IMPLANT
CATH EXTRAC PRONTO 5.5F 138CM (CATHETERS) ×1 IMPLANT
CATH INFINITI 5FR JL4 (CATHETERS) ×1 IMPLANT
CATH INFINITI JR4 5F (CATHETERS) ×1 IMPLANT
CATH LAUNCHER 6FR IMA (CATHETERS) ×1 IMPLANT
CATH TELEPORT (CATHETERS) ×1 IMPLANT
DEVICE RAD COMP TR BAND LRG (VASCULAR PRODUCTS) ×1 IMPLANT
GLIDESHEATH SLEND SS 6F .021 (SHEATH) ×1 IMPLANT
GUIDEWIRE INQWIRE 1.5J.035X260 (WIRE) IMPLANT
INQWIRE 1.5J .035X260CM (WIRE) ×2
KIT ENCORE 26 ADVANTAGE (KITS) ×1 IMPLANT
KIT HEART LEFT (KITS) ×2 IMPLANT
PACK CARDIAC CATHETERIZATION (CUSTOM PROCEDURE TRAY) ×2 IMPLANT
SHEATH PINNACLE 5F 10CM (SHEATH) ×1 IMPLANT
SHEATH PROBE COVER 6X72 (BAG) ×1 IMPLANT
TRANSDUCER W/STOPCOCK (MISCELLANEOUS) ×2 IMPLANT
TUBING CIL FLEX 10 FLL-RA (TUBING) ×2 IMPLANT
WIRE COUGAR XT STRL 190CM (WIRE) ×1 IMPLANT
WIRE COUGAR XT STRL 300CM (WIRE) ×1 IMPLANT
WIRE EMERALD 3MM-J .035X150CM (WIRE) ×1 IMPLANT
WIRE MAILMAN 300CM (WIRE) ×1 IMPLANT

## 2020-07-28 NOTE — Telephone Encounter (Signed)
Pt c/o of Chest Pain: STAT if CP now or developed within 24 hours  1. Are you having CP right now? Yes, but has eased up  2. Are you experiencing any other symptoms (ex. SOB, nausea, vomiting, sweating)? Little bit of sweating  3. How long have you been experiencing CP? 15-20 minutes  4. Is your CP continuous or coming and going? continuous  5. Have you taken Nitroglycerin? no ?  Patient's wife states the patient has been having chest pain for the past 15-20 minutes. She states he had a catheterization yesterday and is out of his nitroglycerin.

## 2020-07-28 NOTE — Progress Notes (Signed)
Progress Note  Patient Name: Kevin Flores Date of Encounter: 07/29/2020  [facility] Cardiologist: Rollene Rotunda, MD   Subjective   Still having 3 out of 10 chest pain.  Having mild shortness of breath.  O2 sats are in the 93 to 95% range with supplemental oxygen.  Uncomfortable in bed.  Inpatient Medications    Scheduled Meds: . allopurinol  300 mg Oral Daily  . aspirin  81 mg Oral Daily  . atorvastatin  20 mg Oral Daily  . carvedilol  6.25 mg Oral BID  . Chlorhexidine Gluconate Cloth  6 each Topical Daily  . clopidogrel  75 mg Oral Q breakfast  . fluticasone  1 spray Each Nare Daily  . levothyroxine  75 mcg Oral Q0600  . loratadine  10 mg Oral QPM  . mometasone-formoterol  2 puff Inhalation BID  . montelukast  10 mg Oral QHS  . pantoprazole  40 mg Oral Daily  . sodium chloride flush  3 mL Intravenous Q12H   Continuous Infusions: . sodium chloride 20 mL/hr at 07/28/20 1205  . sodium chloride    . heparin 1,200 Units/hr (07/29/20 0400)   PRN Meds: sodium chloride, acetaminophen, morphine injection, ondansetron (ZOFRAN) IV, oxyCODONE, sodium chloride flush   Vital Signs    Vitals:   07/29/20 0200 07/29/20 0300 07/29/20 0355 07/29/20 0400  BP: (!) 133/101 (!) 125/98  (!) 120/98  Pulse: 94 89  92  Resp: (!) 27 (!) 24  14  Temp:   97.9 F (36.6 C)   TempSrc:   Oral   SpO2: 90% 96%  94%  Weight:      Height:        Intake/Output Summary (Last 24 hours) at 07/29/2020 0515 Last data filed at 07/29/2020 0400 Gross per 24 hour  Intake 636.38 ml  Output 225 ml  Net 411.38 ml   Last 3 Weights 07/28/2020 07/27/2020 07/13/2020  Weight (lbs) 190 lb 190 lb 193 lb 12.8 oz  Weight (kg) 86.183 kg 86.183 kg 87.907 kg      Telemetry    Sinus rhythm with occasional PVCs.- Personally Reviewed  ECG    A.m. tracing has not been performed.  Last tracing was 07/28/2020 at 1:36 PM revealing dramatic precordial ST elevation and left axis deviation.- Personally  Reviewed  Physical Exam  Skin color is good. GEN: No acute distress.  Skin is warm and dry Neck:  With patient lying at 30 degrees, JVD is noted to the angle of the jaw. Cardiac: RRR, no murmurs, rubs, or gallops.  Respiratory: Clear to auscultation bilaterally. GI: Soft, nontender, non-distended  MS: No edema; No deformity. Neuro:  Nonfocal  Psych: Normal affect   Labs    High Sensitivity Troponin:   Recent Labs  Lab 07/28/20 1155 07/28/20 1429  TROPONINIHS 18* 348*      Chemistry Recent Labs  Lab 07/27/20 1023 07/28/20 1429 07/29/20 0146  NA 144  143 139 139  K 4.1  4.0 4.2 4.4  CL  --  106 107  CO2  --  22 22  GLUCOSE  --  144* 122*  BUN  --  19 21  CREATININE  --  1.73* 1.81*  CALCIUM  --  9.4 9.5  PROT  --  7.3  --   ALBUMIN  --  3.5  --   AST  --  25  --   ALT  --  17  --   ALKPHOS  --  84  --  BILITOT  --  1.3*  --   GFRNONAA  --  40* 38*  ANIONGAP  --  11 10     Hematology Recent Labs  Lab 07/27/20 1023 07/28/20 1155 07/29/20 0146  WBC  --  8.7 14.0*  RBC  --  5.30 5.49  HGB 15.6  15.6 16.0 16.7  HCT 46.0  46.0 50.0 51.2  MCV  --  94.3 93.3  MCH  --  30.2 30.4  MCHC  --  32.0 32.6  RDW  --  14.9 15.0  PLT  --  171 155    BNPNo results for input(s): BNP, PROBNP in the last 168 hours.   DDimer No results for input(s): DDIMER in the last 168 hours.   Radiology    CARDIAC CATHETERIZATION  Result Date: 07/28/2020  Balloon angioplasty was performed.  Post intervention, there is a 100% residual stenosis.  Interval occlusion of the LIMA to LAD graft, suspect atheroembolic event.  Unsuccessful reperfusion using aspiration thrombectomy and balloon angioplasty throughout the course of the LAD and left internal mammary graft. Recommendations: Medical therapy, start clopidogrel, check 2D echo, resume heparin and continue for 24 to 48 hours.  Discussed findings and recommendations for medical therapy with the patient and his family.  I do not  think he has any revascularization options as this appears to be a distal embolic event.   CARDIAC CATHETERIZATION  Result Date: 07/27/2020  1st Diag lesion is 99% stenosed. This is a small vessel.  Ost LM to Mid LM lesion is 75% stenosed. Mid LAD lesion is 100% stenosed. Prox LAD lesion is 90% stenosed. LIMA to LAD is patent.  Prox Cx to Mid Cx lesion is 100% stenosed. 2nd Mrg-1 lesion is 80% stenosed. 2nd Mrg-2 lesion is 100% stenosed. SVG to OM is patent but the native vessel is now occluded at the insertion. There is retrograde flow into the circumflex, but no antegrade flow to the distal OM.  Ost Cx to Prox Cx lesion is 90% stenosed with 80% stenosed side branch in 1st Mrg.  Previously placed RPDA stent is widely patent.  LV end diastolic pressure is mildly elevated.  There is no aortic valve stenosis.  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  Ischemia noted in 2019 is likely due to this disease. No target for PCI.  Medical therapy for LV dysfunction.  Follow mitral regurgitation and consider mitraclip eval if heart failure progresses.   DG Chest Port 1 View  Result Date: 07/28/2020 CLINICAL DATA:  Chest pain. EXAM: PORTABLE CHEST 1 VIEW COMPARISON:  CT 07/14/2020.  Chest x-ray 01/19/2020. FINDINGS: Prior CABG. Stable cardiomegaly. Low lung volumes. No focal infiltrate. No pleural effusion or pneumothorax. IMPRESSION: 1. Prior CABG. Stable cardiomegaly. 2. Low lung volumes. No focal infiltrate. Electronically Signed   By: Maisie Fus  Register   On: 07/28/2020 12:36    CARDIAC CATH PCI 07/28/2020: Intervention   Interval occlusion of the LIMA to LAD graft, suspect atheroembolic event.  Unsuccessful reperfusion using aspiration thrombectomy and balloon angioplasty throughout the course of the LAD and left internal mammary graft.  Recommendations: Medical  therapy, start clopidogrel, check 2D echo, resume heparin and continue for 24 to 48 hours.  Discussed findings and recommendations for medical therapy with the patient and his family.  I do not think he has any revascularization  options as this appears to be a distal embolic event.  2D Doppler echocardiogram 06/30/2020: IMPRESSIONS    1. Left ventricular ejection fraction, by estimation, is <20%. The left  ventricle has severely decreased function. The left ventricle demonstrates  global hypokinesis. The left ventricular internal cavity size was  moderately dilated. Left ventricular  diastolic parameters are consistent with Grade II diastolic dysfunction  (pseudonormalization).  2. Right ventricular systolic function is moderately reduced. The right  ventricular size is mildly enlarged. There is moderately elevated  pulmonary artery systolic pressure. The estimated right ventricular  systolic pressure is 56.2 mmHg.  3. Left atrial size was severely dilated.  4. Right atrial size was mildly dilated.  5. The mitral valve is normal in structure. Moderate mitral valve  regurgitation. No evidence of mitral stenosis.  6. The aortic valve is calcified. Aortic valve regurgitation is trivial.  No aortic stenosis is present.  7. Aortic dilatation noted. There is mild dilatation of the ascending  aorta, measuring 39 mm.  8. The inferior vena cava is normal in size with <50% respiratory  variability, suggesting right atrial pressure of 8 mmHg.   Patient Profile     79 y.o. male with history of coronary artery disease status post remote CABG and prior PCI procedures.  He underwent cardiac catheterization 07/27/2020 because of progressive LV systolic dysfunction.  He was found to have no targets for PCI with recommendation for ongoing medical therapy.  At 10:00 this morning he developed acute onset of substernal chest pain and after arrival in the emergency department his EKG evolved into an  anterior STEMI. Cath demostrated occlusion of LIMA and could not be recanalized.  Assessment & Plan    1. Anterior MI, due to occlusion of LIMA to LAD: Continued residual chest discomfort though less so as time goes along.  Overall guarded prognosis given pre-existing severe LV systolic function and now massive anteroapical infarct. 2. Acute on chronic systolic heart failure: Neck veins up this morning.  He has some mild dyspnea.  We will give IV Lasix 40 mg and dose accordingly thereafter.  We will see how he does.  He may need advanced heart failure management. 3. Acute on chronic CKD stage III: Closely watch kidney function after contrast exposure 2 days in succession.  Creatinine is elevated above baseline which is usually around 1.6  For questions or updates, please contact [facility] Please consult [email or web address] for contact info under        Signed, Lesleigh Noe, MD  07/28/2020, 5:12 PM

## 2020-07-28 NOTE — Telephone Encounter (Signed)
Spoke with pt and his wife. Pt states he is having active chest pain. Onset 20 minutes ago. Pain and tightness have somewhat improved at that time of our call, but there is still residual active chest pain. States he is always somewhat short of breath, but denies worsening shortness of breath at this time. Denies arm pain or diaphoresis. States he has a history of CABG and subsequent stents. States he was catheterized yesterday, but says medical therapy was recommended.   Pt does not have any nitroglycerin tablets and requests that we prescribe him some. Will seek MD authorization.  Urged pt to seek emergent evaluation for ongoing chest pain. Pt and wife verbalize understanding.

## 2020-07-28 NOTE — H&P (Signed)
Cardiology Admission History and Physical:   Patient ID: Kevin Flores MRN: [id number]; DOB: 01-27-1941   Admission date: 07/28/2020  Primary Care Provider: Junie Spencer, FNP [facility] Cardiologist: Rollene Rotunda, MD  [facility] Electrophysiologist:  None   Chief Complaint:  Chest pain  Patient Profile:   Kevin Flores is a 79 y.o. male with history of coronary artery disease status post remote CABG and prior PCI procedures.  He underwent cardiac catheterization yesterday because of progressive LV systolic dysfunction.  He was found to have no targets for PCI with recommendation for ongoing medical therapy.  At 10:00 this morning he developed acute onset of substernal chest pain and after arrival in the emergency department his EKG evolved into an anterior STEMI.  I evaluated him in the emergency room and elected to bring him to the cardiac catheterization lab emergently for cardiac cath and possible PCI.  History of Present Illness:   Kevin Flores is a 79 year old male with extensive ischemic heart disease status post cardiac catheterization yesterday.  This demonstrated continued patency of the LIMA to LAD and saphenous vein graft to obtuse marginal as well as continued patency of the native RCA.  There were no targets for PCI and ongoing medical therapy was recommended.  At 10 AM today he developed substernal chest pressure.  His wife states that he looked pale and diaphoretic.  EMS was called and initial EKG was suspicious for anterior injury but did not meet STEMI criteria.  However, after arrival in the emergency department, a repeat EKG showed fairly marked anterior ST segment elevation consistent with an anterior wall STEMI.  At the time of my evaluation he continued to have 2/10 chest pain and we elected to bring him directly to the cardiac catheterization lab.  States that he felt fine when he went to bed last night and also when he woke up this morning.  His symptoms started  abruptly at 10:00 this morning.  Has some shortness of breath, but no other associated symptoms reported.  Procedure was done from right femoral access yesterday.   Past Medical History:  Diagnosis Date  . Allergy    seasonal  . Arthritis   . CAD (coronary artery disease)    1998 LIMA to the LAD, SVG to circumflex. DES placed to the PDA 11/2009   . GERD with stricture   . Gout   . Heartburn   . Hyperlipidemia   . Hypertension   . Nephrolithiasis    left  . Personal history of colonic polyps    adenomas  . Thyroid disease     Past Surgical History:  Procedure Laterality Date  . BIOPSY PROSTATE    . cardiac stents  2001   and [id number]  . COLONOSCOPY W/ POLYPECTOMY  2009   3 small adenomas  . CORONARY ARTERY BYPASS GRAFT  1998   2 bypass  . cysto with calculus manipulation    . RIGHT/LEFT HEART CATH AND CORONARY/GRAFT ANGIOGRAPHY N/A 07/27/2020   Procedure: RIGHT/LEFT HEART CATH AND CORONARY/GRAFT ANGIOGRAPHY;  Surgeon: Corky Crafts, MD;  Location: [facility] INVASIVE CV LAB;  Service: Cardiovascular;  Laterality: N/A;  . UPPER GASTROINTESTINAL ENDOSCOPY       Medications Prior to Admission: Prior to Admission medications   Medication Sig Start Date End Date Taking? Authorizing Provider  allopurinol (ZYLOPRIM) 300 MG tablet Take 1 tablet (300 mg total) by mouth daily. 01/12/20   Junie Spencer, FNP  aspirin 81 MG chewable tablet Chew 1 tablet (81  mg total) by mouth daily. 11/25/17   Berton Bon, NP  atorvastatin (LIPITOR) 20 MG tablet Take 1 tablet (20 mg total) by mouth daily. 01/12/20   Junie Spencer, FNP  budesonide-formoterol (SYMBICORT) 160-4.5 MCG/ACT inhaler Inhale 2 puffs into the lungs in the morning and at bedtime. Patient taking differently: Inhale 2 puffs into the lungs 2 (two) times daily as needed (shortness of breath).  04/26/20   Coralyn Helling, MD  Carboxymethylcellul-Glycerin (LUBRICATING EYE DROPS OP) Place 1 drop into both eyes daily as needed (dry eyes).     [provider]  carvedilol (COREG) 12.5 MG tablet Take 0.5 tablets (6.25 mg total) by mouth 2 (two) times daily. 07/14/20 10/12/20  Rollene Rotunda, MD  Cholecalciferol (VITAMIN D-3 PO) Take 1 capsule by mouth daily.    [provider]  fish oil-omega-3 fatty acids 1000 MG capsule Take 1 g by mouth at bedtime.     [provider]  fluticasone (FLONASE) 50 MCG/ACT nasal spray Place 1 spray into both nostrils daily. 01/19/20   Coralyn Helling, MD  furosemide (LASIX) 20 MG tablet TAKE 1 TABLET BY MOUTH EVERY OTHER DAY Patient taking differently: Take 20 mg by mouth every other day.  03/21/20   Junie Spencer, FNP  guaiFENesin (MUCINEX) 600 MG 12 hr tablet Take 2 tablets (1,200 mg total) by mouth 2 (two) times daily as needed for cough or to loosen phlegm. 05/17/20   Coralyn Helling, MD  levocetirizine (XYZAL) 5 MG tablet Take 1 tablet (5 mg total) by mouth every evening. 01/19/20   Coralyn Helling, MD  levothyroxine (SYNTHROID) 75 MCG tablet TAKE 1 TABLET BY MOUTH EVERY DAY Patient taking differently: Take 75 mcg by mouth daily.  03/30/20   Jannifer Rodney A, FNP  montelukast (SINGULAIR) 10 MG tablet TAKE 1 TABLET BY MOUTH EVERYDAY AT BEDTIME Patient taking differently: Take 10 mg by mouth at bedtime.  07/18/20   Coralyn Helling, MD  pantoprazole (PROTONIX) 40 MG tablet TAKE 1 TABLET BY MOUTH EVERY DAY Patient taking differently: Take 40 mg by mouth daily.  06/14/20   Jannifer Rodney A, FNP  TURMERIC PO Take 2 tablets by mouth daily.     [provider]  vitamin C (ASCORBIC ACID) 500 MG tablet Take 500 mg by mouth daily.    [provider]     Allergies:    Allergies  Allergen Reactions  . Imdur [Isosorbide Dinitrate] Other (See Comments)    Headache, "made my head swim"  . Rosuvastatin Other (See Comments)    SEVERE muscle and leg cramps/aches  . Sulfonamide Derivatives Rash and Other (See Comments)    Also "made the whites of my eyes turn yellow"    Social History:    Social History   Socioeconomic History  . Marital status: Married    Spouse name: [name]  . Number of children: 4  . Years of education: 69  . Highest education level: High school graduate  Occupational History  . Occupation: retired  Tobacco Use  . Smoking status: Former Smoker    Packs/day: 1.00    Years: 12.00    Pack years: 12.00    Types: Cigarettes    Quit date: 12/19/1971    Years since quitting: 48.6  . Smokeless tobacco: Never Used  Vaping Use  . Vaping Use: Never used  Substance and Sexual Activity  . Alcohol use: No  . Drug use: No  . Sexual activity: Not Currently  Other Topics Concern  . 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  Retired from [employer]   Enjoys golfing   Former smoker   No caffeine, tobacco, drug use, alcohol   Social Determinants of Corporate investment bankerHealth   Financial Resource Strain: Medium Risk  . Difficulty of Paying Living Expenses: Somewhat hard  Food Insecurity: No Food Insecurity  . Worried About Programme researcher, broadcasting/film/videounning Out of Food in the Last Year: Never true  . Ran Out of Food in the Last Year: Never true  Transportation Needs: No Transportation Needs  . Lack of Transportation (Medical): No  . Lack of Transportation (Non-Medical): No  Physical Activity: Sufficiently Active  . Days of Exercise per Week: 3 days  . Minutes of Exercise per Session: 60 min  Stress: No Stress Concern Present  . Feeling of Stress : Only a little  Social Connections: Socially Integrated  . Frequency of Communication with Friends and Family: More than three times a week  . Frequency of Social Gatherings with Friends and Family: More than three times a week  . Attends Religious Services: More than 4 times per year  . Active Member of Clubs or Organizations: Yes  . Attends Tax inspectorClub or  Organization Meetings: More than 4 times per year  . Marital Status: Married  Catering managerntimate Partner Violence: Not At Risk  . Fear of Current or Ex-Partner: No  . Emotionally Abused: No  . Physically Abused: No  . Sexually Abused: No    Family History:   The patient's family history includes Breast cancer in his sister; Colon cancer in his brother; Diabetes in his brother and father; Heart disease in his brother. There is no history of Esophageal cancer, Liver cancer, Pancreatic cancer, Prostate cancer, Rectal cancer, or Stomach cancer.    ROS:  Please see the history of present illness.  All other ROS reviewed and negative.     Physical Exam/Data:   Vitals:   07/28/20 1400 07/28/20 1405 07/28/20 1410 07/28/20 1425  BP: (!) 136/96 140/88 (!) 126/93 (!) 129/105  Pulse: 81 84 79 84  Resp: (!) 23 (!) 25 12 (!) 24  Temp:      TempSrc:      SpO2: 93% 94% 95% 96%  Weight:      Height:       No intake or output data in the 24 hours ending 07/28/20 1449 Last 3 Weights 07/28/2020 07/27/2020 07/13/2020  Weight (lbs) 190 lb 190 lb 193 lb 12.8 oz  Weight (kg) 86.183 kg 86.183 kg 87.907 kg     Body mass index is 28.06 kg/m.  General:  Well nourished, well developed, elderly male in no acute distress HEENT: normal Lymph: no adenopathy Neck: no JVD Endocrine:  No thryomegaly Vascular: No carotid bruits; FA pulses 2+ bilaterally  Cardiac:  normal S1, S2; RRR; no murmur  Lungs:  clear to auscultation bilaterally, no wheezing, rhonchi or rales  Abd: soft, nontender, no hepatomegaly  Ext: no edema Musculoskeletal:  No deformities, BUE and BLE strength normal and equal Skin: warm and dry  Neuro:  CNs 2-12 intact, no focal abnormalities noted Psych:  Normal affect    EKG:  The ECG that was done was personally reviewed and demonstrates NSR with anterolateral STEMI pattern  Relevant CV Studies: Echo 06-30-2020: IMPRESSIONS  1. Left ventricular ejection fraction, by estimation, is <20%.  The left  ventricle has severely decreased function. The left ventricle demonstrates  global hypokinesis. The left ventricular internal cavity size was  moderately dilated. Left ventricular  diastolic parameters are consistent with Grade II diastolic dysfunction  (pseudonormalization).  2. Right ventricular systolic function is moderately reduced. The right  ventricular size is mildly enlarged. There is moderately elevated  pulmonary artery systolic pressure. The estimated right ventricular  systolic pressure is 56.2 mmHg.  3. Left atrial size was severely dilated.  4. Right atrial size was mildly dilated.  5. The mitral valve is normal in structure. Moderate mitral valve  regurgitation. No evidence of mitral stenosis.  6. The aortic valve is calcified. Aortic valve regurgitation is trivial.  No aortic stenosis is present.  7. Aortic dilatation noted. There is mild dilatation of the ascending  aorta, measuring 39 mm.  8. The inferior vena cava is normal in size with <50% respiratory  variability, suggesting right atrial pressure of 8 mmHg.   Cardiac Cath 07/27/2020: Conclusion    1st Diag lesion is 99% stenosed. This is a small vessel.  Ost LM to Mid LM lesion is 75% stenosed. Mid LAD lesion is 100% stenosed. Prox LAD lesion is 90% stenosed. LIMA to LAD is patent.  Prox Cx to Mid Cx lesion is 100% stenosed. 2nd Mrg-1 lesion is 80% stenosed. 2nd Mrg-2 lesion is 100% stenosed. SVG to OM is patent but the native vessel is now occluded at the insertion. There is retrograde flow into the circumflex, but no antegrade flow to the distal OM.  Ost Cx to Prox Cx lesion is 90% stenosed with 80% stenosed side branch in 1st Mrg.  Previously placed RPDA stent is widely patent.  LV end diastolic pressure is mildly elevated.  There is no aortic valve stenosis.  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  Ischemia noted in 2019 is likely due to this disease.  No target for PCI.  Medical therapy for LV dysfunction.  Follow mitral regurgitation and consider mitraclip eval if heart failure progresses.    Laboratory Data:  High Sensitivity Troponin:   Recent Labs  Lab 07/28/20 1155  TROPONINIHS 18*      Chemistry Recent Labs  Lab 07/27/20 1023  NA 144  143  K 4.1  4.0    No results for input(s): PROT, ALBUMIN, AST, ALT, ALKPHOS, BILITOT in the last 168 hours. Hematology Recent Labs  Lab 07/27/20 1023 07/28/20 1155  WBC  --  8.7  RBC  --  5.30  HGB 15.6  15.6 16.0  HCT 46.0  46.0 50.0  MCV  --  94.3  MCH  --  30.2  MCHC  --  32.0  RDW  --  14.9  PLT  --  171   BNPNo results for input(s): BNP, PROBNP in the last 168 hours.  DDimer No results for input(s): DDIMER in the last 168 hours.   Radiology/Studies:  DG Chest Port 1 View  Result Date: 07/28/2020 CLINICAL DATA:  Chest pain. EXAM: PORTABLE CHEST 1 VIEW COMPARISON:  CT 07/14/2020.  Chest x-ray 01/19/2020. FINDINGS: Prior CABG. Stable cardiomegaly. Low lung volumes. No focal infiltrate. No pleural effusion or pneumothorax. IMPRESSION: 1. Prior CABG. Stable cardiomegaly. 2. Low lung volumes. No focal infiltrate. Electronically Signed  ByMaisie Fus  Register   On: 07/28/2020 12:36     Assessment and Plan:   21. 79 year old male with acute anterior wall STEMI: Reviewed cardiac catheterization films from yesterday.  He clearly has new anterior ST segment elevation compared to his prior EKG.  I completely agree that there were no targets for PCI based on yesterday's coronary anatomy.  Will reassess as I suspect there has been some significant interval change over the last 24 hours.  Emergency implied consent is obtained for the procedure.  I reviewed our planned access through the left radial artery with the patient and his wife.  They understand the risks,  indications, and alternatives to cardiac catheterization and primary PCI as treatment of acute anterior MI.  He has received 4000 units of IV unfractionated heparin.  We will otherwise continue his medical regimen.      TIMI Risk Score for ST  Elevation MI:   The patient's TIMI risk score is  , which indicates a  % risk of all cause mortality at 30 days.     For questions or updates, please contact [facility] Please consult [email or web address] for contact info under     Signed, Tonny Bollman, MD  07/28/2020 2:49 PM

## 2020-07-28 NOTE — ED Notes (Signed)
Doug ([facility]) called @ 1156-per Dr. Criss Alvine paged by Marylene Land

## 2020-07-28 NOTE — ED Provider Notes (Signed)
[facility] Provider Note   CSN: [id number] Arrival date & time: 07/28/20  1145     History Chief Complaint  Patient presents with  . Chest Pain    Kevin Flores is a 79 y.o. male.  HPI 79 year old male presents with chest pain.  Brought in by EMS.  Patient had a heart cath yesterday for shortness of breath and abnormal echo.  Medical management was decided and supposed to stenting.  This morning around 10 AM he developed chest pain that feels like a burning and tightness.  Started sweating at the beginning of it and also felt short of breath.  Right now the pain is not too bad at 3 out of 10.  Was given aspirin by EMS. Level 5 caveat for acuity of condition   Past Medical History:  Diagnosis Date  . Allergy    seasonal  . Arthritis   . CAD (coronary artery disease)    1998 LIMA to the LAD, SVG to circumflex. DES placed to the PDA 11/2009   . GERD with stricture   . Gout   . Heartburn   . Hyperlipidemia   . Hypertension   . Nephrolithiasis    left  . Personal history of colonic polyps    adenomas  . Thyroid disease     Patient Active Problem List   Diagnosis Date Noted  . Acute systolic heart failure (HCC)   . Educated about COVID-19 virus infection 06/07/2020  . PVC's (premature ventricular contractions) 06/07/2020  . Chronic diastolic CHF (congestive heart failure), NYHA class 2 (HCC) 06/07/2020  . Chronic cough 12/07/2019  . Renal cyst 10/28/2019  . Nephrolithiasis 10/28/2019  . Other dysphagia 12/31/2017  . CHF (congestive heart failure) (HCC) 11/23/2017  . Plantar fasciitis 04/09/2017  . Achilles tendinitis of left lower extremity 04/09/2017  . Chronic gout of right ankle 02/12/2017  . Overweight (BMI 25.0-29.9) 05/21/2016  . Gastroesophageal reflux disease 10/17/2015  . Metabolic syndrome 10/17/2015  . Joint swelling 04/25/2015  . Vitamin D deficiency 04/06/2015  . Hypothyroidism 04/06/2015  . PALPITATIONS  12/21/2009  . Hyperlipidemia 12/04/2008  . Essential hypertension 12/04/2008  . Coronary atherosclerosis 12/04/2008  . SINUS BRADYCARDIA 12/04/2008    Past Surgical History:  Procedure Laterality Date  . BIOPSY PROSTATE    . cardiac stents  2001   and [id number]  . COLONOSCOPY W/ POLYPECTOMY  2009   3 small adenomas  . CORONARY ARTERY BYPASS GRAFT  1998   2 bypass  . cysto with calculus manipulation    . RIGHT/LEFT HEART CATH AND CORONARY/GRAFT ANGIOGRAPHY N/A 07/27/2020   Procedure: RIGHT/LEFT HEART CATH AND CORONARY/GRAFT ANGIOGRAPHY;  Surgeon: Corky CraftsVaranasi, Jayadeep S, MD;  [facility];  Service: Cardiovascular;  Laterality: N/A;  . UPPER GASTROINTESTINAL ENDOSCOPY         Family History  Problem Relation Age of Onset  . Colon cancer Brother        3960s  . Diabetes Brother   . Heart disease Brother   . Diabetes Father   . Breast cancer Sister   . Esophageal cancer Neg Hx   . Liver cancer Neg Hx   . Pancreatic cancer Neg Hx   . Prostate cancer Neg Hx   . Rectal cancer Neg Hx   . Stomach cancer Neg Hx     Social History   Tobacco Use  . Smoking status: Former Smoker    Packs/day: 1.00    Years: 12.00  Pack years: 12.00    Types: Cigarettes    Quit date: 12/19/1971    Years since quitting: 48.6  . Smokeless tobacco: Never Used  Vaping Use  . Vaping Use: Never used  Substance Use Topics  . Alcohol use: No  . Drug use: No    Home Medications Prior to Admission medications   Medication Sig Start Date End Date Taking? Authorizing Provider  allopurinol (ZYLOPRIM) 300 MG tablet Take 1 tablet (300 mg total) by mouth daily. 01/12/20   Junie Spencer, FNP  aspirin 81 MG chewable tablet Chew 1 tablet (81 mg total) by mouth daily. 11/25/17   Berton Bon, NP  atorvastatin (LIPITOR) 20 MG tablet Take 1 tablet (20 mg total) by mouth daily. 01/12/20   Junie Spencer, FNP  budesonide-formoterol (SYMBICORT) 160-4.5 MCG/ACT inhaler Inhale 2 puffs into the lungs  in the morning and at bedtime. Patient taking differently: Inhale 2 puffs into the lungs 2 (two) times daily as needed (shortness of breath).  04/26/20   Coralyn Helling, MD  Carboxymethylcellul-Glycerin (LUBRICATING EYE DROPS OP) Place 1 drop into both eyes daily as needed (dry eyes).    [provider]  carvedilol (COREG) 12.5 MG tablet Take 0.5 tablets (6.25 mg total) by mouth 2 (two) times daily. 07/14/20 10/12/20  Rollene Rotunda, MD  Cholecalciferol (VITAMIN D-3 PO) Take 1 capsule by mouth daily.    [provider]  fish oil-omega-3 fatty acids 1000 MG capsule Take 1 g by mouth at bedtime.     [provider]  fluticasone (FLONASE) 50 MCG/ACT nasal spray Place 1 spray into both nostrils daily. 01/19/20   Coralyn Helling, MD  furosemide (LASIX) 20 MG tablet TAKE 1 TABLET BY MOUTH EVERY OTHER DAY Patient taking differently: Take 20 mg by mouth every other day.  03/21/20   Junie Spencer, FNP  guaiFENesin (MUCINEX) 600 MG 12 hr tablet Take 2 tablets (1,200 mg total) by mouth 2 (two) times daily as needed for cough or to loosen phlegm. 05/17/20   Coralyn Helling, MD  levocetirizine (XYZAL) 5 MG tablet Take 1 tablet (5 mg total) by mouth every evening. 01/19/20   Coralyn Helling, MD  levothyroxine (SYNTHROID) 75 MCG tablet TAKE 1 TABLET BY MOUTH EVERY DAY Patient taking differently: Take 75 mcg by mouth daily.  03/30/20   Jannifer Rodney A, FNP  montelukast (SINGULAIR) 10 MG tablet TAKE 1 TABLET BY MOUTH EVERYDAY AT BEDTIME Patient taking differently: Take 10 mg by mouth at bedtime.  07/18/20   Coralyn Helling, MD  pantoprazole (PROTONIX) 40 MG tablet TAKE 1 TABLET BY MOUTH EVERY DAY Patient taking differently: Take 40 mg by mouth daily.  06/14/20   Jannifer Rodney A, FNP  TURMERIC PO Take 2 tablets by mouth daily.     [provider]  vitamin C (ASCORBIC ACID) 500 MG tablet Take 500 mg by mouth daily.    [provider]    Allergies    Imdur [isosorbide dinitrate],  Rosuvastatin, and Sulfonamide derivatives  Review of Systems   Review of Systems  Unable to perform ROS: Acuity of condition  Constitutional: Positive for fatigue.  Respiratory: Positive for shortness of breath.   Cardiovascular: Positive for chest pain.    Physical Exam Updated Vital Signs BP (!) 140/113   Pulse 80   Temp 97.9 F (36.6 C) (Oral)   Resp (!) 24   Ht 5\' 9"  (1.753 m)   Wt 86.2 kg   SpO2 97%   BMI 28.06  kg/m   Physical Exam Vitals and nursing note reviewed.  Constitutional:      General: He is not in acute distress.    Appearance: He is well-developed. He is not ill-appearing or diaphoretic.  HENT:     Head: Normocephalic and atraumatic.     Right Ear: External ear normal.     Left Ear: External ear normal.     Nose: Nose normal.  Eyes:     General:        Right eye: No discharge.        Left eye: No discharge.  Cardiovascular:     Rate and Rhythm: Normal rate and regular rhythm.     Pulses:          Radial pulses are 2+ on the right side and 2+ on the left side.     Heart sounds: Normal heart sounds.  Pulmonary:     Effort: Pulmonary effort is normal.     Breath sounds: Normal breath sounds.  Abdominal:     Palpations: Abdomen is soft.     Tenderness: There is no abdominal tenderness.  Musculoskeletal:     Cervical back: Neck supple.  Skin:    General: Skin is warm and dry.  Neurological:     Mental Status: He is alert.  Psychiatric:        Mood and Affect: Mood is not anxious.     ED Results / Procedures / Treatments   Labs (all labs ordered are listed, but only abnormal results are displayed) Labs Reviewed  RESPIRATORY PANEL BY RT PCR (FLU A&B, COVID)  HEMOGLOBIN A1C  CBC WITH DIFFERENTIAL/PLATELET  PROTIME-INR  APTT  COMPREHENSIVE METABOLIC PANEL  LIPID PANEL  TROPONIN I (HIGH SENSITIVITY)    EKG EKG Interpretation  Date/Time:  Thursday July 28 2020 11:49:38 EST Ventricular Rate:  78 PR Interval:    QRS  Duration: 91 QT Interval:  420 QTC Calculation: 479 R Axis:   -48 Text Interpretation: Sinus rhythm Prolonged PR interval Left anterior fascicular block Extensive anterior infarct, acute (LAD) >>> Acute MI <<< Confirmed by Pricilla Loveless [phone]) on 07/28/2020 11:54:37 AM   Radiology CARDIAC CATHETERIZATION  Result Date: 07/27/2020  1st Diag lesion is 99% stenosed. This is a small vessel.  Ost LM to Mid LM lesion is 75% stenosed. Mid LAD lesion is 100% stenosed. Prox LAD lesion is 90% stenosed. LIMA to LAD is patent.  Prox Cx to Mid Cx lesion is 100% stenosed. 2nd Mrg-1 lesion is 80% stenosed. 2nd Mrg-2 lesion is 100% stenosed. SVG to OM is patent but the native vessel is now occluded at the insertion. There is retrograde flow into the circumflex, but no antegrade flow to the distal OM.  Ost Cx to Prox Cx lesion is 90% stenosed with 80% stenosed side branch in 1st Mrg.  Previously placed RPDA stent is widely patent.  LV end diastolic pressure is mildly elevated.  There is no aortic valve stenosis.  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  Ischemia noted in 2019 is likely due to this disease. No target for PCI.  Medical therapy for LV dysfunction.  Follow mitral regurgitation and consider mitraclip eval if heart failure progresses.    Procedures .Critical Care Performed by: Pricilla Loveless, MD Authorized by: Pricilla Loveless, MD   Critical care provider statement:  Critical care time (minutes):  30   Critical care time was exclusive of:  Separately billable procedures and treating other patients   Critical care was necessary to treat or prevent imminent or life-threatening deterioration of the following conditions:  Circulatory failure and cardiac failure   Critical care was time spent personally by me on the following activities:  Discussions with  consultants, evaluation of patient's response to treatment, examination of patient, ordering and performing treatments and interventions, ordering and review of laboratory studies, ordering and review of radiographic studies, pulse oximetry, re-evaluation of patient's condition, obtaining history from patient or surrogate and review of old charts   (including critical care time)  Medications Ordered in ED Medications  0.9 %  sodium chloride infusion ( Intravenous New Bag/Given (Non-Interop) 07/28/20 1205)  heparin injection 4,000 Units (4,000 Units Intravenous Given 07/28/20 1206)    ED Course  I have reviewed the triage vital signs and the nursing notes.  Pertinent labs & imaging results that were available during my care of the patient were reviewed by me and considered in my medical decision making (see chart for details).    MDM Rules/Calculators/A&P                          Patient's ECG in the ED is much worse than the EMS ECG with anterior ST elevations.  This is consistent with STEMI.  I discussed with Dr. Excell Seltzer who has evaluated patient and given he is symptomatic will take back to the Cath Lab emergently now.  Given aspirin by EMS and given heparin here. Otherwise vitals are stable.  Final Clinical Impression(s) / ED Diagnoses Final diagnoses:  ST elevation myocardial infarction (STEMI), unspecified artery [facility])    Rx / DC Orders ED Discharge Orders    None       Pricilla Loveless, MD 07/28/20 1224

## 2020-07-28 NOTE — Telephone Encounter (Signed)
The patient is in the hospital getting a cath.

## 2020-07-28 NOTE — ED Notes (Signed)
Cardiology at bedside.

## 2020-07-28 NOTE — Progress Notes (Addendum)
ANTICOAGULATION CONSULT NOTE - Initial Consult  Pharmacy Consult for heparin  Indication: chest pain/ACS  Allergies  Allergen Reactions  . Imdur [Isosorbide Dinitrate] Other (See Comments)    Headache, "made my head swim"  . Rosuvastatin Other (See Comments)    SEVERE muscle and leg cramps/aches  . Sulfonamide Derivatives Rash and Other (See Comments)    Also "made the whites of my eyes turn yellow"    Patient Measurements: Height: 5\' 9"  (175.3 cm) Weight: 86.2 kg (190 lb) IBW/kg (Calculated) : 70.7   Vital Signs: Temp: 97.9 F (36.6 C) (11/18 1151) Temp Source: Oral (11/18 1151) BP: 146/73 (11/18 1455) Pulse Rate: 43 (11/18 1455)  Labs: Recent Labs    07/27/20 1023 07/28/20 1155  HGB 15.6  15.6 16.0  HCT 46.0  46.0 50.0  PLT  --  171  APTT  --  31  LABPROT  --  14.5  INR  --  1.2  TROPONINIHS  --  18*    Estimated Creatinine Clearance: 36 mL/min (A) (by C-G formula based on SCr of 1.87 mg/dL (H)).   Medical History: Past Medical History:  Diagnosis Date  . Allergy    seasonal  . Arthritis   . CAD (coronary artery disease)    1998 LIMA to the LAD, SVG to circumflex. DES placed to the PDA 11/2009   . GERD with stricture   . Gout   . Heartburn   . Hyperlipidemia   . Hypertension   . Nephrolithiasis    left  . Personal history of colonic polyps    adenomas  . Thyroid disease     Medications:  Medications Prior to Admission  Medication Sig Dispense Refill Last Dose  . allopurinol (ZYLOPRIM) 300 MG tablet Take 1 tablet (300 mg total) by mouth daily. 90 tablet 3   . aspirin 81 MG chewable tablet Chew 1 tablet (81 mg total) by mouth daily. 90 tablet 3   . atorvastatin (LIPITOR) 20 MG tablet Take 1 tablet (20 mg total) by mouth daily. 90 tablet 3   . budesonide-formoterol (SYMBICORT) 160-4.5 MCG/ACT inhaler Inhale 2 puffs into the lungs in the morning and at bedtime. (Patient taking differently: Inhale 2 puffs into the lungs 2 (two) times daily as  needed (shortness of breath). ) 1 each 6   . Carboxymethylcellul-Glycerin (LUBRICATING EYE DROPS OP) Place 1 drop into both eyes daily as needed (dry eyes).     . carvedilol (COREG) 12.5 MG tablet Take 0.5 tablets (6.25 mg total) by mouth 2 (two) times daily. 180 tablet 3   . Cholecalciferol (VITAMIN D-3 PO) Take 1 capsule by mouth daily.     . fish oil-omega-3 fatty acids 1000 MG capsule Take 1 g by mouth at bedtime.      . fluticasone (FLONASE) 50 MCG/ACT nasal spray Place 1 spray into both nostrils daily. 16 g 6   . furosemide (LASIX) 20 MG tablet TAKE 1 TABLET BY MOUTH EVERY OTHER DAY (Patient taking differently: Take 20 mg by mouth every other day. ) 45 tablet 1   . guaiFENesin (MUCINEX) 600 MG 12 hr tablet Take 2 tablets (1,200 mg total) by mouth 2 (two) times daily as needed for cough or to loosen phlegm.     12/2009 levocetirizine (XYZAL) 5 MG tablet Take 1 tablet (5 mg total) by mouth every evening.     Marland Kitchen levothyroxine (SYNTHROID) 75 MCG tablet TAKE 1 TABLET BY MOUTH EVERY DAY (Patient taking differently: Take 75 mcg by mouth daily. )  90 tablet 3   . montelukast (SINGULAIR) 10 MG tablet TAKE 1 TABLET BY MOUTH EVERYDAY AT BEDTIME (Patient taking differently: Take 10 mg by mouth at bedtime. ) 90 tablet 3   . pantoprazole (PROTONIX) 40 MG tablet TAKE 1 TABLET BY MOUTH EVERY DAY (Patient taking differently: Take 40 mg by mouth daily. ) 90 tablet 0   . TURMERIC PO Take 2 tablets by mouth daily.      . vitamin C (ASCORBIC ACID) 500 MG tablet Take 500 mg by mouth daily.      Scheduled:  . carvedilol  6.25 mg Oral BID  . pantoprazole  40 mg Oral Daily    Assessment: 79 yo male s/p cath with aspiration thrombectomy and balloon angioplasty. Pharmacy consulted to dose heparin (start 4 hours after sheath removal; removed ~ 1:30pm). Plans noted to continue heparin for 24 to 48 hours.   Goal of Therapy:  Heparin level 0.3-0.7 units/ml Monitor platelets by anticoagulation protocol: Yes   Plan:    -heparin at 1200 units/hr  -Heparin level in 8 hours and daily wth CBC daily  Harland German, PharmD Clinical Pharmacist **Pharmacist phone directory can now be found on [email or web address] ([email or web address]).  Listed under [facility].

## 2020-07-28 NOTE — ED Triage Notes (Signed)
Pt arrives to ED via [facility] d/t substernal CP rated 8/10 that does not radiate. Pt took one Nitro and baby ASA at home & before his arrival to ED his pain decreased to 2/10. EMS reports pt had a cardiac cath yesterday & pt was informed he has a "weak left side of the heart."

## 2020-07-29 ENCOUNTER — Inpatient Hospital Stay (HOSPITAL_COMMUNITY): Payer: Medicare Other

## 2020-07-29 ENCOUNTER — Inpatient Hospital Stay: Payer: Self-pay

## 2020-07-29 ENCOUNTER — Encounter (HOSPITAL_COMMUNITY): Payer: Self-pay | Admitting: Cardiovascular Disease

## 2020-07-29 DIAGNOSIS — R570 Cardiogenic shock: Secondary | ICD-10-CM | POA: Diagnosis not present

## 2020-07-29 DIAGNOSIS — I213 ST elevation (STEMI) myocardial infarction of unspecified site: Secondary | ICD-10-CM

## 2020-07-29 DIAGNOSIS — I351 Nonrheumatic aortic (valve) insufficiency: Secondary | ICD-10-CM | POA: Diagnosis not present

## 2020-07-29 DIAGNOSIS — R079 Chest pain, unspecified: Secondary | ICD-10-CM

## 2020-07-29 DIAGNOSIS — I5023 Acute on chronic systolic (congestive) heart failure: Secondary | ICD-10-CM

## 2020-07-29 DIAGNOSIS — N1832 Chronic kidney disease, stage 3b: Secondary | ICD-10-CM | POA: Diagnosis not present

## 2020-07-29 DIAGNOSIS — I340 Nonrheumatic mitral (valve) insufficiency: Secondary | ICD-10-CM

## 2020-07-29 LAB — BASIC METABOLIC PANEL
Anion gap: 10 (ref 5–15)
Anion gap: 14 (ref 5–15)
BUN: 21 mg/dL (ref 8–23)
BUN: 26 mg/dL — ABNORMAL HIGH (ref 8–23)
CO2: 22 mmol/L (ref 22–32)
CO2: 19 mmol/L — ABNORMAL LOW (ref 22–32)
Calcium: 9.5 mg/dL (ref 8.9–10.3)
Calcium: 9.6 mg/dL (ref 8.9–10.3)
Chloride: 107 mmol/L (ref 98–111)
Chloride: 102 mmol/L (ref 98–111)
Creatinine, Ser: 1.81 mg/dL — ABNORMAL HIGH (ref 0.61–1.24)
Creatinine, Ser: 1.96 mg/dL — ABNORMAL HIGH (ref 0.61–1.24)
GFR, Estimated: 38 mL/min — ABNORMAL LOW (ref >60)
GFR, Estimated: 35 mL/min — ABNORMAL LOW (ref >60)
Glucose, Bld: 122 mg/dL — ABNORMAL HIGH (ref 70–99)
Glucose, Bld: 142 mg/dL — ABNORMAL HIGH (ref 70–99)
Potassium: 4.4 mmol/L (ref 3.5–5.1)
Potassium: 4.5 mmol/L (ref 3.5–5.1)
Sodium: 139 mmol/L (ref 135–145)
Sodium: 135 mmol/L (ref 135–145)

## 2020-07-29 LAB — CBC
HCT: 51.2 % (ref 39.0–52.0)
Hemoglobin: 16.7 g/dL (ref 13.0–17.0)
MCH: 30.4 pg (ref 26.0–34.0)
MCHC: 32.6 g/dL (ref 30.0–36.0)
MCV: 93.3 fL (ref 80.0–100.0)
Platelets: 155 10*3/uL (ref 150–400)
RBC: 5.49 MIL/uL (ref 4.22–5.81)
RDW: 15 % (ref 11.5–15.5)
WBC: 14 10*3/uL — ABNORMAL HIGH (ref 4.0–10.5)
nRBC: 0 % (ref 0.0–0.2)

## 2020-07-29 LAB — LACTIC ACID, PLASMA: Lactic Acid, Venous: 2.3 mmol/L (ref 0.5–1.9)

## 2020-07-29 LAB — ECHOCARDIOGRAM COMPLETE
Area-P 1/2: 5.54 cm2
Height: 69 in
P 1/2 time: 460 msec
S' Lateral: 5.5 cm
Weight: 3153.46 oz

## 2020-07-29 LAB — COOXEMETRY PANEL
Carboxyhemoglobin: 1.2 % (ref 0.5–1.5)
Methemoglobin: 0.8 % (ref 0.0–1.5)
O2 Saturation: 53.8 %
Total hemoglobin: 16.1 g/dL — ABNORMAL HIGH (ref 12.0–16.0)

## 2020-07-29 LAB — HEPARIN LEVEL (UNFRACTIONATED)
Heparin Unfractionated: 0.36 IU/mL (ref 0.30–0.70)
Heparin Unfractionated: 0.47 IU/mL (ref 0.30–0.70)

## 2020-07-29 LAB — TROPONIN I (HIGH SENSITIVITY): Troponin I (High Sensitivity): >27000 ng/L (ref <18)

## 2020-07-29 LAB — BRAIN NATRIURETIC PEPTIDE: B Natriuretic Peptide: 3099.8 pg/mL — ABNORMAL HIGH (ref 0.0–100.0)

## 2020-07-29 MED ORDER — PERFLUTREN LIPID MICROSPHERE
1.0000 mL | INTRAVENOUS | Status: AC | PRN
  Administered 2020-07-29: 2 mL via INTRAVENOUS
  Filled 2020-07-29: qty 10

## 2020-07-29 MED ORDER — FUROSEMIDE 10 MG/ML IJ SOLN
40.0000 mg | Freq: Once | INTRAMUSCULAR | Status: AC
  Administered 2020-07-29: 40 mg via INTRAVENOUS
  Filled 2020-07-29: qty 4

## 2020-07-29 MED ORDER — MIDAZOLAM HCL 2 MG/2ML IJ SOLN
INTRAMUSCULAR | Status: AC
  Filled 2020-07-29: qty 2

## 2020-07-29 MED ORDER — MILRINONE LACTATE IN DEXTROSE 20-5 MG/100ML-% IV SOLN
0.1250 ug/kg/min | INTRAVENOUS | Status: DC
  Administered 2020-07-29 – 2020-08-01 (×5): 0.25 ug/kg/min via INTRAVENOUS
  Administered 2020-08-01 – 2020-08-02 (×2): 0.125 ug/kg/min via INTRAVENOUS
  Filled 2020-07-29 (×7): qty 100

## 2020-07-29 MED ORDER — FUROSEMIDE 10 MG/ML IJ SOLN
80.0000 mg | Freq: Once | INTRAMUSCULAR | Status: AC
  Administered 2020-07-29: 80 mg via INTRAVENOUS
  Filled 2020-07-29: qty 8

## 2020-07-29 MED ORDER — ALUM & MAG HYDROXIDE-SIMETH 200-200-20 MG/5ML PO SUSP
30.0000 mL | ORAL | Status: DC | PRN
  Administered 2020-07-29: 30 mL via ORAL
  Filled 2020-07-29: qty 30

## 2020-07-29 MED ORDER — ATORVASTATIN CALCIUM 10 MG PO TABS
20.0000 mg | ORAL_TABLET | Freq: Every day | ORAL | Status: DC
  Administered 2020-07-29 – 2020-08-10 (×13): 20 mg via ORAL
  Filled 2020-07-29 (×13): qty 2

## 2020-07-29 MED ORDER — CHLORHEXIDINE GLUCONATE CLOTH 2 % EX PADS
6.0000 | MEDICATED_PAD | Freq: Every day | CUTANEOUS | Status: DC
  Administered 2020-07-29 – 2020-08-11 (×11): 6 via TOPICAL

## 2020-07-29 MED ORDER — MIDAZOLAM HCL 2 MG/2ML IJ SOLN
1.0000 mg | Freq: Once | INTRAMUSCULAR | Status: AC
  Administered 2020-07-29: 1 mg via INTRAVENOUS

## 2020-07-29 MED ORDER — FUROSEMIDE 10 MG/ML IJ SOLN
80.0000 mg | Freq: Two times a day (BID) | INTRAMUSCULAR | Status: DC
  Administered 2020-07-30: 80 mg via INTRAVENOUS
  Filled 2020-07-29: qty 8

## 2020-07-29 MED ORDER — MIDAZOLAM HCL 2 MG/2ML IJ SOLN: 2.0000 mg | Freq: Once | INTRAMUSCULAR | Status: DC

## 2020-07-29 NOTE — Consult Note (Signed)
Advanced Heart Failure Team Consult Note   Primary Physician: Kevin Spencer, FNP PCP-Cardiologist:  Kevin Rotunda, MD  Reason for Consultation: Cardiogenic shock    HPI:    Kevin Flores is seen today for evaluation of cardiogenic shock at the request of Dr. Shari Flores.   79 y.o.malewith history of HTN, HL, CKD 3b coronary artery disease status post CABG 1998 and prior PCI procedures. He underwent cardiac catheterization on 11/17 because of progressive LV systolic dysfunction EF < 20%. EF 3/19 (EF 50-55%%  He was found to have severe 3v CAD with patent LIMA to LAD and SVG to OM (but native vessel occluded at insertion). No targets for PCI with recommendation for ongoing medical therapy.  On 11/18, he developed acute onset of substernal chest pain and after arrival in the emergency department his EKG evolved into an anterior STEMI. Cath demonstrated occluded LIMA-LAD which was unable to be revascularized. Transferred to ICU. Hstrop > 27,000  Developed low output HF with lactate 2.3 and AKI.Marland Kitchen Started on milrinone.   Now feeling abetter. CP resolved. Mildly SOB.   At baseline relatively active.   Review of Systems: [y] = yes, [ ]  = no   . General: Weight gain [ ] ; Weight loss [ ] ; Anorexia [ ] ; Fatigue ]; Fever [ ] ; Chills [ ] ; Weakness [ y]  . Cardiac: Chest pain/pressure [ y]; Resting SOB ]; Exertional SOB ]; Orthopnea [ ] ; Pedal Edema [ ] ; Palpitations [ ] ; Syncope [ ] ; Presyncope [ ] ; Paroxysmal nocturnal dyspnea[ ]   . Pulmonary: Cough [ ] ; Wheezing[ ] ; Hemoptysis[ ] ; Sputum [ ] ; Snoring [ ]   . GI: Vomiting[ ] ; Dysphagia[ ] ; Melena[ ] ; Hematochezia [ ] ; Heartburn[ ] ; Abdominal pain [ ] ; Constipation [ ] ; Diarrhea [ ] ; BRBPR [ ]   . GU: Hematuria[ ] ; Dysuria [ ] ; Nocturia[ ]   . Vascular: Pain in legs with walking [ ] ; Pain in feet with lying flat [ ] ; Non-healing sores [ ] ; Stroke [ ] ; TIA [ ] ; Slurred speech [ ] ;  . Neuro: Headaches[ ] ; Vertigo[ ] ; Seizures[ ] ;  Paresthesias[ ] ;Blurred vision [ ] ; Diplopia [ ] ; Vision changes [ ]   . Ortho/Skin: Arthritis ]; Joint pain Cove.Etienne ]; Muscle pain [ ] ; Joint swelling [ ] ; Back Pain [ ] ; Rash [ ]   . Psych: Depression[ ] ; Anxiety[ ]   . Heme: Bleeding problems [ ] ; Clotting disorders [ ] ; Anemia [ ]   . Endocrine: Diabetes [ ] ; Thyroid dysfunction[y ]  Home Medications Prior to Admission medications   Medication Sig Start Date End Date Taking? Authorizing Provider  allopurinol (ZYLOPRIM) 300 MG tablet Take 1 tablet (300 mg total) by mouth daily. 01/12/20   , FNP  aspirin 81 MG chewable tablet Chew 1 tablet (81 mg total) by mouth daily. 11/25/17   Cove.Etienne, NP  atorvastatin (LIPITOR) 20 MG tablet Take 1 tablet (20 mg total) by mouth daily. 01/12/20   , FNP  budesonide-formoterol (SYMBICORT) 160-4.5 MCG/ACT inhaler Inhale 2 puffs into the lungs in the morning and at bedtime. Patient taking differently: Inhale 2 puffs into the lungs 2 (two) times daily as needed (shortness of breath).  04/26/20   , MD  Carboxymethylcellul-Glycerin (LUBRICATING EYE DROPS OP) Place 1 drop into both eyes daily as needed (dry eyes).    [provider]  carvedilol (COREG) 12.5 MG tablet Take 0.5 tablets (6.25 mg total) by mouth 2 (two) times daily. 07/14/20 10/12/20  Hochrein,  Fayrene Fearing, MD  Cholecalciferol (VITAMIN D-3 PO) Take 1 capsule by mouth daily.    [provider]  fish oil-omega-3 fatty acids 1000 MG capsule Take 1 g by mouth at bedtime.     [provider]  fluticasone (FLONASE) 50 MCG/ACT nasal spray Place 1 spray into both nostrils daily. 01/19/20   Coralyn Helling, MD  furosemide (LASIX) 20 MG tablet TAKE 1 TABLET BY MOUTH EVERY OTHER DAY Patient taking differently: Take 20 mg by mouth every other day.  03/21/20   Kevin Spencer, FNP  guaiFENesin (MUCINEX) 600 MG 12 hr tablet Take 2 tablets (1,200 mg total) by mouth 2 (two) times daily as needed for cough or to  loosen phlegm. 05/17/20   Coralyn Helling, MD  levocetirizine (XYZAL) 5 MG tablet Take 1 tablet (5 mg total) by mouth every evening. 01/19/20   Coralyn Helling, MD  levothyroxine (SYNTHROID) 75 MCG tablet TAKE 1 TABLET BY MOUTH EVERY DAY Patient taking differently: Take 75 mcg by mouth daily.  03/30/20   Jannifer Rodney A, FNP  montelukast (SINGULAIR) 10 MG tablet TAKE 1 TABLET BY MOUTH EVERYDAY AT BEDTIME Patient taking differently: Take 10 mg by mouth at bedtime.  07/18/20   Coralyn Helling, MD  pantoprazole (PROTONIX) 40 MG tablet TAKE 1 TABLET BY MOUTH EVERY DAY Patient taking differently: Take 40 mg by mouth daily.  06/14/20   Jannifer Rodney A, FNP  TURMERIC PO Take 2 tablets by mouth daily.     [provider]  vitamin C (ASCORBIC ACID) 500 MG tablet Take 500 mg by mouth daily.    [provider]    Past Medical History: Past Medical History:  Diagnosis Date  . Allergy    seasonal  . Arthritis   . CAD (coronary artery disease)    1998 LIMA to the LAD, SVG to circumflex. DES placed to the PDA 11/2009   . GERD with stricture   . Gout   . Heartburn   . Hyperlipidemia   . Hypertension   . Nephrolithiasis    left  . Personal history of colonic polyps    adenomas  . Thyroid disease     Past Surgical History: Past Surgical History:  Procedure Laterality Date  . BIOPSY PROSTATE    . cardiac stents  2001   and 35361  . COLONOSCOPY W/ POLYPECTOMY  2009   3 small adenomas  . CORONARY ARTERY BYPASS GRAFT  1998   2 bypass  . CORONARY/GRAFT ACUTE MI REVASCULARIZATION N/A 07/28/2020   Procedure: CORONARY/GRAFT ACUTE MI REVASCULARIZATION;  Surgeon: Tonny Bollman, MD;  Location: [facility] INVASIVE CV LAB;  Service: Cardiovascular;  Laterality: N/A;  . cysto with calculus manipulation    . RIGHT/LEFT HEART CATH AND CORONARY/GRAFT ANGIOGRAPHY N/A 07/27/2020   Procedure: RIGHT/LEFT HEART CATH AND CORONARY/GRAFT ANGIOGRAPHY;  Surgeon: Corky Crafts, MD;  Location: [facility] INVASIVE CV LAB;   Service: Cardiovascular;  Laterality: N/A;  . UPPER GASTROINTESTINAL ENDOSCOPY      Family History: Family History  Problem Relation Age of Onset  . Colon cancer Brother        18s  . Diabetes Brother   . Heart disease Brother   . Diabetes Father   . Breast cancer Sister   . Esophageal cancer Neg Hx   . Liver cancer Neg Hx   . Pancreatic cancer Neg Hx   . Prostate cancer Neg Hx   . Rectal cancer Neg Hx   . Stomach cancer Neg Hx  Social History: Social History   Socioeconomic History  . Marital status: Married    Spouse name: Jerrye BeaversHazel  . Number of children: 4  . Years of education: 6912  . Highest education level: High school graduate  Occupational History  . Occupation: retired  Tobacco Use  . Smoking status: Former Smoker    Packs/day: 1.00    Years: 12.00    Pack years: 12.00    Types: Cigarettes    Quit date: 12/19/1971    Years since quitting: 48.6  . Smokeless tobacco: Never Used  Vaping Use  . Vaping Use: Never used  Substance and Sexual Activity  . Alcohol use: No  . Drug use: No  . Sexual activity: Not Currently  Other Topics Concern  . 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  Retired from [employer]   Enjoys golfing   Former smoker   No caffeine, tobacco, drug use, alcohol   Social Determinants of Corporate investment bankerHealth   Financial Resource Strain: Medium Risk  . Difficulty of Paying Living Expenses: Somewhat hard  Food Insecurity: No Food Insecurity  . Worried About Programme researcher, broadcasting/film/videounning Out of Food in the Last Year: Never true  . Ran Out of Food in the Last Year: Never true  Transportation Needs: No Transportation Needs  . Lack of Transportation (Medical): No  . Lack of Transportation (Non-Medical): No  Physical Activity: Sufficiently Active  . Days of Exercise per Week: 3 days   . Minutes of Exercise per Session: 60 min  Stress: No Stress Concern Present  . Feeling of Stress : Only a little  Social Connections: Socially Integrated  . Frequency of Communication with Friends and Family: More than three times a week  . Frequency of Social Gatherings with Friends and Family: More than three times a week  . Attends Religious Services: More than 4 times per year  . Active Member of Clubs or Organizations: Yes  . Attends BankerClub or Organization Meetings: More than 4 times per year  . Marital Status: Married    Allergies:  Allergies  Allergen Reactions  . Imdur [Isosorbide Dinitrate] Other (See Comments)    Headache, "made my head swim"  . Rosuvastatin Other (See Comments)    SEVERE muscle and leg cramps/aches  . Sulfonamide Derivatives Rash and Other (See Comments)    Also "made the whites of my eyes turn yellow"    Objective:    Vital Signs:   Temp:  [97.7 F (36.5 C)-98.3 F (36.8 C)] 97.7 F (36.5 C) (11/19 1505) Pulse Rate:  [76-101] 82 (11/19 1700) Resp:  [14-36] 22 (11/19 1600) BP: (112-151)/(81-111) 116/87 (11/19 1700) SpO2:  [88 %-100 %] 100 % (11/19 1700) Weight:  [89.4 kg] 89.4 kg (11/19 0531) Last BM Date:  (PTA)  Weight change: Filed Weights   07/28/20 1200 07/29/20 0531  Weight: 86.2 kg 89.4 kg    Intake/Output:   Intake/Output Summary (Last 24 hours) at 07/29/2020 1919 Last data filed at 07/29/2020 1700 Gross per 24 hour  Intake 990.57 ml  Output 1200 ml  Net -209.43 ml      Physical Exam    General:  Elderly frail No resp difficulty HEENT: normal Neck: supple. JVP 7 . Carotids 2+ bilat; no bruits. No lymphadenopathy or thyromegaly appreciated.  Cor: PMI nondisplaced. Regular rate & rhythm. No rubs, gallops or murmurs. Lungs: clear Abdomen: soft, nontender, nondistended. No hepatosplenomegaly. No bruits or masses. Good bowel sounds. Extremities: no cyanosis, clubbing, rash, edema Neuro: alert & orientedx3, cranial nerves  grossly intact. moves all 4 extremities w/o difficulty. Affect pleasant   Telemetry   Sinus 80s occasional PVCs Personally reviewed   EKG   Sinus 90 1AVB anterior qs Personally reviewed   Labs   Basic Metabolic Panel: Recent Labs  Lab 07/27/20 1023 07/28/20 1429 07/29/20 0146 07/29/20 1643  NA 144  143 139 139 135  K 4.1  4.0 4.2 4.4 4.5  CL  --  106 107 102  CO2  --  22 22 19*  GLUCOSE  --  144* 122* 142*  BUN  --  19 21 26*  CREATININE  --  1.73* 1.81* 1.96*  CALCIUM  --  9.4 9.5 9.6    Liver Function Tests: Recent Labs  Lab 07/28/20 1429  AST 25  ALT 17  ALKPHOS 84  BILITOT 1.3*  PROT 7.3  ALBUMIN 3.5   No results for input(s): LIPASE, AMYLASE in the last 168 hours. No results for input(s): AMMONIA in the last 168 hours.  CBC: Recent Labs  Lab 07/27/20 1023 07/28/20 1155 07/29/20 0146  WBC  --  8.7 14.0*  NEUTROABS  --  6.1  --   HGB 15.6  15.6 16.0 16.7  HCT 46.0  46.0 50.0 51.2  MCV  --  94.3 93.3  PLT  --  171 155    Cardiac Enzymes: No results for input(s): CKTOTAL, CKMB, CKMBINDEX, TROPONINI in the last 168 hours.  BNP: BNP (last 3 results) Recent Labs    06/09/20 1012 07/29/20 1643  BNP 774.8* 3,099.8*    ProBNP (last 3 results) No results for input(s): PROBNP in the last 8760 hours.   CBG: No results for input(s): GLUCAP in the last 168 hours.  Coagulation Studies: Recent Labs    07/28/20 1155  LABPROT 14.5  INR 1.2     Imaging   ECHOCARDIOGRAM COMPLETE  Result Date: 07/29/2020    ECHOCARDIOGRAM REPORT   Patient Name:   THURL BOEN Date of Exam: 07/29/2020 Medical Rec #:  [id number]    Height:       69.0 in Accession #:    [id number]   Weight:       197.1 lb Date of Birth:  03-04-1941    BSA:          2.053 m Patient Age:    79 years     BP:           131/100 mmHg Patient Gender: M            HR:           86 bpm. Exam Location:  Inpatient Procedure: 2D Echo, Cardiac Doppler, Color Doppler and Intracardiac             Opacification Agent Indications:    R07.9* Chest pain, unspecified  History:        Patient has prior history of Echocardiogram examinations, most                 recent 06/30/2020. CAD, Prior CABG, Signs/Symptoms:Dyspnea; Risk                 Factors:Hypertension, Dyslipidemia and GERD. Thyroid Disease.  Sonographer:    Elmarie Shiley Dance Referring Phys: [id number] MICHAEL COOPER IMPRESSIONS  1. Severely reduced LV  function, 10-15%. LVOT VTI 5 cm. Left ventricular ejection fraction, by estimation, is 10-15%. The left ventricle has severely decreased function. The left ventricle demonstrates global hypokinesis. The left ventricular internal cavity size was mildly to moderately dilated. Left ventricular diastolic parameters are consistent with Grade II diastolic dysfunction (pseudonormalization). Elevated left atrial pressure.  2. Right ventricular systolic function is severely reduced. The right ventricular size is mildly enlarged. There is mildly elevated pulmonary artery systolic pressure. The estimated right ventricular systolic pressure is 47.7 mmHg.  3. Left atrial size was mild to moderately dilated.  4. The mitral valve is grossly normal. Mild to moderate mitral valve regurgitation.  5. The aortic valve is tricuspid. Aortic valve regurgitation is mild. No aortic stenosis is present.  6. The inferior vena cava is dilated in size with >50% respiratory variability, suggesting right atrial pressure of 8 mmHg. Comparison(s): No significant change from prior study. FINDINGS  Left Ventricle: Severely reduced LV function, 10-15%. LVOT VTI 5 cm. Left ventricular ejection fraction, by estimation, is 10-15%. The left ventricle has severely decreased function. The left ventricle demonstrates global hypokinesis. Definity contrast agent was given IV to delineate the left ventricular endocardial borders. The left ventricular internal cavity size was mildly to moderately dilated. There is no left ventricular hypertrophy.  Left ventricular diastolic parameters are consistent with Grade II diastolic dysfunction (pseudonormalization). Elevated left atrial pressure. Right Ventricle: The right ventricular size is mildly enlarged. No increase in right ventricular wall thickness. Right ventricular systolic function is severely reduced. There is mildly elevated pulmonary artery systolic pressure. The tricuspid regurgitant velocity is 3.15 m/s, and with an assumed right atrial pressure of 8 mmHg, the estimated right ventricular systolic pressure is 47.7 mmHg. Left Atrium: Left atrial size was mild to moderately dilated. Right Atrium: Right atrial size was normal in size. Pericardium: Trivial pericardial effusion is present. Mitral Valve: The mitral valve is grossly normal. Mild to moderate mitral valve regurgitation. Tricuspid Valve: The tricuspid valve is grossly normal. Tricuspid valve regurgitation is trivial. No evidence of tricuspid stenosis. Aortic Valve: The aortic valve is tricuspid. Aortic valve regurgitation is mild. Aortic regurgitation PHT measures 460 msec. No aortic stenosis is present. Pulmonic Valve: The pulmonic valve was grossly normal. Pulmonic valve regurgitation is trivial. No evidence of pulmonic stenosis. Aorta: The aortic root and ascending aorta are structurally normal, with no evidence of dilitation. Venous: The inferior vena cava is dilated in size with greater than 50% respiratory variability, suggesting right atrial pressure of 8 mmHg. IAS/Shunts: The atrial septum is grossly normal. Additional Comments: There is a small pleural effusion in the left lateral region.  LEFT VENTRICLE PLAX 2D LVIDd:         6.00 cm  Diastology LVIDs:         5.50 cm  LV e' medial:    4.26 cm/s LV PW:         1.60 cm  LV E/e' medial:  23.9 LV IVS:        0.80 cm  LV e' lateral:   4.74 cm/s LVOT diam:     2.00 cm  LV E/e' lateral: 21.5 LV SV:         16 LV SV Index:   8 LVOT Area:     3.14 cm  RIGHT VENTRICLE            IVC RV Basal  diam:  3.70 cm    IVC diam: 2.30 cm RV Mid diam:    2.60 cm RV  S prime:     3.09 cm/s TAPSE (M-mode): 1.1 cm LEFT ATRIUM              Index       RIGHT ATRIUM           Index LA diam:        5.10 cm  2.48 cm/m  RA Area:     18.70 cm LA Vol (A2C):   111.0 ml 54.06 ml/m RA Volume:   53.50 ml  26.06 ml/m LA Vol (A4C):   57.6 ml  28.05 ml/m LA Biplane Vol: 85.2 ml  41.50 ml/m  AORTIC VALVE LVOT Vmax:   38.35 cm/s LVOT Vmean:  24.000 cm/s LVOT VTI:    0.049 m AI PHT:      460 msec  AORTA Ao Root diam: 3.90 cm Ao Asc diam:  3.90 cm MITRAL VALVE                TRICUSPID VALVE MV Area (PHT): 5.54 cm     TR Peak grad:   39.7 mmHg MV Decel Time: 137 msec     TR Vmax:        315.00 cm/s MV E velocity: 102.00 cm/s MV A velocity: 74.90 cm/s   SHUNTS MV E/A ratio:  1.36         Systemic VTI:  0.05 m                             Systemic Diam: 2.00 cm Lennie Odor MD Electronically signed by Lennie Odor MD Signature Date/Time: 07/29/2020/11:05:51 AM    Final    [place] EKG SITE RITE  Result Date: 07/29/2020 If Site Rite image not attached, placement could not be confirmed due to current cardiac rhythm.     Medications:     Current Medications: . allopurinol  300 mg Oral Daily  . aspirin  81 mg Oral Daily  . atorvastatin  20 mg Oral Daily  . Chlorhexidine Gluconate Cloth  6 each Topical Daily  . clopidogrel  75 mg Oral Q breakfast  . fluticasone  1 spray Each Nare Daily  . levothyroxine  75 mcg Oral Q0600  . loratadine  10 mg Oral QPM  . mometasone-formoterol  2 puff Inhalation BID  . montelukast  10 mg Oral QHS  . pantoprazole  40 mg Oral Daily  . sodium chloride flush  3 mL Intravenous Q12H     Infusions: . sodium chloride 20 mL/hr at 07/28/20 1205  . sodium chloride    . heparin 1,200 Units/hr (07/29/20 1700)  . milrinone 0.25 mcg/kg/min (07/29/20 1834)       Assessment/Plan   1. Acute on chronic systolic HF -> cardiogenic shock - echo 3/19 EF 50-55% - echo 10/21 EF < 20% RV  moderately reduced - echo 11/21 EF < 10-15 % RV moderately reduced mild to moderate MR - now with post MI shock with increasing lactate and AKI - agree with milrinone - will place central access to follow co-ox and CVP - stop b-blocker - diurese as needed - in looking at cath films suspect initial decrease in EF from 3/19 -> 10/21 was nonischemic in nature (?PVCs) but no situation c/b large anterior infract with LIMA occlusion - will need to assess for candidacy of LVAD. Will hold plavix for now.   2. CAD with acute anterior infarct due to thrombotic LIMA occlusion - on heparin, ASA, statin - no -blocker  with shock  3. AKI on CKD 3b - baseline creatinine 1.6-187 - now q.96 due to ATN/shock - support hemodynamically  CRITICAL CARE Performed by: Arvilla Meres  Total critical care time: 60 minutes  Critical care time was exclusive of separately billable procedures and treating other patients.  Critical care was necessary to treat or prevent imminent or life-threatening deterioration.  Critical care was time spent personally by me (independent of midlevel providers or residents) on the following activities: development of treatment plan with patient and/or surrogate as well as nursing, discussions with consultants, evaluation of patient's response to treatment, examination of patient, obtaining history from patient or surrogate, ordering and performing treatments and interventions, ordering and review of laboratory studies, ordering and review of radiographic studies, pulse oximetry and re-evaluation of patient's condition.   Length of Stay: 1  Arvilla Meres, MD  07/29/2020, 7:19 PM  Advanced Heart Failure Team Pager [phone] (M-F; 7a - 4p)  Please contact [facility] for night-coverage after hours (4p -7a ) and weekends on [email or web address]

## 2020-07-29 NOTE — Progress Notes (Addendum)
Progress Note  Patient Name: Kevin Flores Date of Encounter: 07/29/2020  [facility] Cardiologist: Rollene Rotunda, MD   Subjective   Pt is more aware of his PVCs. Reports urinating more since 80 mg IV lasix. No chest pain.   Inpatient Medications    Scheduled Meds: . allopurinol  300 mg Oral Daily  . aspirin  81 mg Oral Daily  . atorvastatin  20 mg Oral Daily  . carvedilol  6.25 mg Oral BID  . Chlorhexidine Gluconate Cloth  6 each Topical Daily  . clopidogrel  75 mg Oral Q breakfast  . fluticasone  1 spray Each Nare Daily  . levothyroxine  75 mcg Oral Q0600  . loratadine  10 mg Oral QPM  . mometasone-formoterol  2 puff Inhalation BID  . montelukast  10 mg Oral QHS  . pantoprazole  40 mg Oral Daily  . sodium chloride flush  3 mL Intravenous Q12H   Continuous Infusions: . sodium chloride 20 mL/hr at 07/28/20 1205  . sodium chloride    . heparin 1,200 Units/hr (07/29/20 1600)   PRN Meds: sodium chloride, acetaminophen, alum & mag hydroxide-simeth, morphine injection, ondansetron (ZOFRAN) IV, oxyCODONE, sodium chloride flush   Vital Signs    Vitals:   07/29/20 1400 07/29/20 1500 07/29/20 1505 07/29/20 1600  BP: (!) 112/92 121/90  (!) 120/96  Pulse: 78 81  88  Resp: (!) 36 19  (!) 22  Temp:   97.7 F (36.5 C)   TempSrc:      SpO2: 94% 99%  94%  Weight:      Height:        Intake/Output Summary (Last 24 hours) at 07/29/2020 1646 Last data filed at 07/29/2020 1600 Gross per 24 hour  Intake 978.57 ml  Output 900 ml  Net 78.57 ml   Last 3 Weights 07/29/2020 07/28/2020 07/27/2020  Weight (lbs) 197 lb 1.5 oz 190 lb 190 lb  Weight (kg) 89.4 kg 86.183 kg 86.183 kg      Telemetry    Sinus HR 80s, PVCs - Personally Reviewed  ECG    Sinus rhythm, HR 90, LAFB, anterior STE - Personally Reviewed  Physical Exam   GEN: elderly male, NAD.   Neck: + JVD Cardiac: RRR, no murmurs, rubs, or gallops.  Respiratory: diminished in bases, crackles GI: Soft,  nontender, non-distended  MS: extremities warm  Neuro:  Nonfocal  Psych: Normal affect   Labs    High Sensitivity Troponin:   Recent Labs  Lab 07/28/20 1155 07/28/20 1429 07/29/20 0551  TROPONINIHS 18* 348* >27,000*      Chemistry Recent Labs  Lab 07/27/20 1023 07/28/20 1429 07/29/20 0146  NA 144  143 139 139  K 4.1  4.0 4.2 4.4  CL  --  106 107  CO2  --  22 22  GLUCOSE  --  144* 122*  BUN  --  19 21  CREATININE  --  1.73* 1.81*  CALCIUM  --  9.4 9.5  PROT  --  7.3  --   ALBUMIN  --  3.5  --   AST  --  25  --   ALT  --  17  --   ALKPHOS  --  84  --   BILITOT  --  1.3*  --   GFRNONAA  --  40* 38*  ANIONGAP  --  11 10     Hematology Recent Labs  Lab 07/27/20 1023 07/28/20 1155 07/29/20 0146  WBC  --  8.7 14.0*  RBC  --  5.30 5.49  HGB 15.6  15.6 16.0 16.7  HCT 46.0  46.0 50.0 51.2  MCV  --  94.3 93.3  MCH  --  30.2 30.4  MCHC  --  32.0 32.6  RDW  --  14.9 15.0  PLT  --  171 155    BNPNo results for input(s): BNP, PROBNP in the last 168 hours.   DDimer No results for input(s): DDIMER in the last 168 hours.   Radiology    CARDIAC CATHETERIZATION  Result Date: 07/28/2020  Balloon angioplasty was performed.  Post intervention, there is a 100% residual stenosis.  Interval occlusion of the LIMA to LAD graft, suspect atheroembolic event.  Unsuccessful reperfusion using aspiration thrombectomy and balloon angioplasty throughout the course of the LAD and left internal mammary graft. Recommendations: Medical therapy, start clopidogrel, check 2D echo, resume heparin and continue for 24 to 48 hours.  Discussed findings and recommendations for medical therapy with the patient and his family.  I do not think he has any revascularization options as this appears to be a distal embolic event.   DG Chest Port 1 View  Result Date: 07/28/2020 CLINICAL DATA:  Chest pain. EXAM: PORTABLE CHEST 1 VIEW COMPARISON:  CT 07/14/2020.  Chest x-ray 01/19/2020. FINDINGS:  Prior CABG. Stable cardiomegaly. Low lung volumes. No focal infiltrate. No pleural effusion or pneumothorax. IMPRESSION: 1. Prior CABG. Stable cardiomegaly. 2. Low lung volumes. No focal infiltrate. Electronically Signed   By: Maisie Fus  Register   On: 07/28/2020 12:36   ECHOCARDIOGRAM COMPLETE  Result Date: 07/29/2020    ECHOCARDIOGRAM REPORT   Patient Name:   Kevin Flores Date of Exam: 07/29/2020 Medical Rec #:  [id number]    Height:       69.0 in Accession #:    [id number]   Weight:       197.1 lb Date of Birth:  20-Feb-1941    BSA:          2.053 m Patient Age:    79 years     BP:           131/100 mmHg Patient Gender: M            HR:           86 bpm. Exam Location:  Inpatient Procedure: 2D Echo, Cardiac Doppler, Color Doppler and Intracardiac            Opacification Agent Indications:    R07.9* Chest pain, unspecified  History:        Patient has prior history of Echocardiogram examinations, most                 recent 06/30/2020. CAD, Prior CABG, Signs/Symptoms:Dyspnea; Risk                 Factors:Hypertension, Dyslipidemia and GERD. Thyroid Disease.  Sonographer:    Elmarie Shiley Dance Referring Phys: [id number] MICHAEL COOPER IMPRESSIONS  1. Severely reduced LV function, 10-15%. LVOT VTI 5 cm. Left ventricular ejection fraction, by estimation, is 10-15%. The left ventricle has severely decreased function. The left ventricle demonstrates global hypokinesis. The left ventricular internal cavity size was mildly to moderately dilated. Left ventricular diastolic parameters are consistent with Grade II diastolic dysfunction (pseudonormalization). Elevated left atrial pressure.  2. Right ventricular systolic function is severely reduced. The right ventricular size is mildly enlarged. There is mildly elevated pulmonary artery systolic pressure. The estimated right ventricular systolic pressure is 47.7 mmHg.  3. Left atrial  size was mild to moderately dilated.  4. The mitral valve is grossly normal. Mild to moderate mitral  valve regurgitation.  5. The aortic valve is tricuspid. Aortic valve regurgitation is mild. No aortic stenosis is present.  6. The inferior vena cava is dilated in size with >50% respiratory variability, suggesting right atrial pressure of 8 mmHg. Comparison(s): No significant change from prior study. FINDINGS  Left Ventricle: Severely reduced LV function, 10-15%. LVOT VTI 5 cm. Left ventricular ejection fraction, by estimation, is 10-15%. The left ventricle has severely decreased function. The left ventricle demonstrates global hypokinesis. Definity contrast agent was given IV to delineate the left ventricular endocardial borders. The left ventricular internal cavity size was mildly to moderately dilated. There is no left ventricular hypertrophy. Left ventricular diastolic parameters are consistent with Grade II diastolic dysfunction (pseudonormalization). Elevated left atrial pressure. Right Ventricle: The right ventricular size is mildly enlarged. No increase in right ventricular wall thickness. Right ventricular systolic function is severely reduced. There is mildly elevated pulmonary artery systolic pressure. The tricuspid regurgitant velocity is 3.15 m/s, and with an assumed right atrial pressure of 8 mmHg, the estimated right ventricular systolic pressure is 47.7 mmHg. Left Atrium: Left atrial size was mild to moderately dilated. Right Atrium: Right atrial size was normal in size. Pericardium: Trivial pericardial effusion is present. Mitral Valve: The mitral valve is grossly normal. Mild to moderate mitral valve regurgitation. Tricuspid Valve: The tricuspid valve is grossly normal. Tricuspid valve regurgitation is trivial. No evidence of tricuspid stenosis. Aortic Valve: The aortic valve is tricuspid. Aortic valve regurgitation is mild. Aortic regurgitation PHT measures 460 msec. No aortic stenosis is present. Pulmonic Valve: The pulmonic valve was grossly normal. Pulmonic valve regurgitation is trivial. No  evidence of pulmonic stenosis. Aorta: The aortic root and ascending aorta are structurally normal, with no evidence of dilitation. Venous: The inferior vena cava is dilated in size with greater than 50% respiratory variability, suggesting right atrial pressure of 8 mmHg. IAS/Shunts: The atrial septum is grossly normal. Additional Comments: There is a small pleural effusion in the left lateral region.  LEFT VENTRICLE PLAX 2D LVIDd:         6.00 cm  Diastology LVIDs:         5.50 cm  LV e' medial:    4.26 cm/s LV PW:         1.60 cm  LV E/e' medial:  23.9 LV IVS:        0.80 cm  LV e' lateral:   4.74 cm/s LVOT diam:     2.00 cm  LV E/e' lateral: 21.5 LV SV:         16 LV SV Index:   8 LVOT Area:     3.14 cm  RIGHT VENTRICLE            IVC RV Basal diam:  3.70 cm    IVC diam: 2.30 cm RV Mid diam:    2.60 cm RV S prime:     3.09 cm/s TAPSE (M-mode): 1.1 cm LEFT ATRIUM              Index       RIGHT ATRIUM           Index LA diam:        5.10 cm  2.48 cm/m  RA Area:     18.70 cm LA Vol (A2C):   111.0 ml 54.06 ml/m RA Volume:   53.50 ml  26.06 ml/m LA Vol (A4C):  57.6 ml  28.05 ml/m LA Biplane Vol: 85.2 ml  41.50 ml/m  AORTIC VALVE LVOT Vmax:   38.35 cm/s LVOT Vmean:  24.000 cm/s LVOT VTI:    0.049 m AI PHT:      460 msec  AORTA Ao Root diam: 3.90 cm Ao Asc diam:  3.90 cm MITRAL VALVE                TRICUSPID VALVE MV Area (PHT): 5.54 cm     TR Peak grad:   39.7 mmHg MV Decel Time: 137 msec     TR Vmax:        315.00 cm/s MV E velocity: 102.00 cm/s MV A velocity: 74.90 cm/s   SHUNTS MV E/A ratio:  1.36         Systemic VTI:  0.05 m                             Systemic Diam: 2.00 cm Lennie Odor MD Electronically signed by Lennie Odor MD Signature Date/Time: 07/29/2020/11:05:51 AM    Final    [place] EKG SITE RITE  Result Date: 07/29/2020 If Site Rite image not attached, placement could not be confirmed due to current cardiac rhythm.   Cardiac Studies   Heart cath 07/28/20:  Balloon angioplasty was  performed.  Post intervention, there is a 100% residual stenosis.   Interval occlusion of the LIMA to LAD graft, suspect atheroembolic event.  Unsuccessful reperfusion using aspiration thrombectomy and balloon angioplasty throughout the course of the LAD and left internal mammary graft.  Recommendations: Medical therapy, start clopidogrel, check 2D echo, resume heparin and continue for 24 to 48 hours.  Discussed findings and recommendations for medical therapy with the patient and his family.  I do not think he has any revascularization options as this appears to be a distal embolic event.   Echo 07/29/20: 1. Severely reduced LV function, 10-15%. LVOT VTI 5 cm. Left ventricular  ejection fraction, by estimation, is 10-15%. The left ventricle has  severely decreased function. The left ventricle demonstrates global  hypokinesis. The left ventricular internal  cavity size was mildly to moderately dilated. Left ventricular diastolic  parameters are consistent with Grade II diastolic dysfunction  (pseudonormalization). Elevated left atrial pressure.  2. Right ventricular systolic function is severely reduced. The right  ventricular size is mildly enlarged. There is mildly elevated pulmonary  artery systolic pressure. The estimated right ventricular systolic  pressure is 47.7 mmHg.  3. Left atrial size was mild to moderately dilated.  4. The mitral valve is grossly normal. Mild to moderate mitral valve  regurgitation.  5. The aortic valve is tricuspid. Aortic valve regurgitation is mild. No  aortic stenosis is present.  6. The inferior vena cava is dilated in size with >50% respiratory  variability, suggesting right atrial pressure of 8 mmHg.   Patient Profile     79 y.o. male with history of coronary artery disease status post remote CABG and prior PCI procedures.  He underwent cardiac catheterization yesterday because of progressive LV systolic dysfunction.  He was found to have no  targets for PCI with recommendation for ongoing medical therapy.  At 10:00 this morning he developed acute onset of substernal chest pain and after arrival in the emergency department his EKG evolved into an anterior STEMI.  I evaluated him in the emergency room and elected to bring him to the cardiac catheterization lab emergently for cardiac cath and possible  PCI.  Assessment & Plan    CAD s/p CABG and subsequent PCI Acute anterior wall STEMI due to occlusion of LIMA-LAD - unfortunately, no targets for PCI, balloon angioplasty was unsuccessful - will pursue medical therapy at this point   Acute on chronic systolic heart failure Acute on chronic kidney disease stage III - EF is further reduced 10-15%, was low normal in 2019 - pt was given 40 mg IV lasix this morning with minimal response - additional 80 mg IV lasix this afternoon, again with minimal output - renal function declined - sCr 1.81 (baseline generally 1.6) - have ordered repeat BMP, lactic acid, and BNP - have ordered stat PICC for access and should we need milrinone support - will also obtain bladder scan to rule out urinary retention   Dr. Shari Prows discussed case with Dr. Gala Romney.    For questions or updates, please contact [facility] Please consult [email or web address] for contact info under        Signed, Marcelino Duster, PA  07/29/2020, 4:46 PM    Patient seen and examined and agree with Micah Flesher, PA. Patient initially seen by Dr. Garnette Scheuermann this AM.  In brief, the patient is a 79 y.o. male with history of coronary artery disease status post remote CABG and prior PCI procedures.  He underwent cardiac catheterization on 11/17 because of progressive LV systolic dysfunction.  He was found to have no targets for PCI with recommendation for ongoing medical therapy.  On 11/18, he developed acute onset of substernal chest pain and after arrival in the emergency department his EKG evolved into an anterior STEMI. Cath  demonstrated occluded LIMA-LAD which was unable to be revascularized. Now transferred to ICU.  TTE with LVEF 10-15%, moderately depressed RV function slightly worse than prior in 06/2020.   Overnight, remained HD stable and chest pain free. Received lasix 40mg  IV this AM without significant response. Repeat lasix 80mg  this afternoon with total 700cc urine made. Cr 1.8. Given tenuous state with concern for worsening UOP, decision was made to place PICC, monitor co-ox, lactate and decide whether inotropic support should be initiated. Patient discussed with HF as well.  Exam: GEN: Comfortable, sitting in a chair, alert    Neck: Mild JVD Cardiac: RR, no murmurs  Respiratory: Faint crackles at the bases GI: Soft, nontender, non-distended  MS: Lukewarm, no significant edema Neuro:  Nonfocal  Psych: Normal affect    Plan:  -Advanced HF consulted -Stop BB for now given concern for low output -Continue ASA, plavix, heparin gtt -Check lactate, repeat bmet now -Place PICC and monitor co-ox -Pending co-ox, may need to consider inotropic support -UOP not brisk but making urine--better response to lasix 80mg  IV; Cr 1.8 -Re-dose lasix as needed -Bladder scan to assess for urinary retention -Very tenuous given LVEF and recent LIMA occlusion; low threshold to advance therapies (inotropic support, IABP) if needed  , MD

## 2020-07-29 NOTE — Progress Notes (Signed)
ANTICOAGULATION CONSULT NOTE   Pharmacy Consult for Heparin  Indication: chest pain/ACS  Allergies  Allergen Reactions  . Imdur [Isosorbide Dinitrate] Other (See Comments)    Headache, "made my head swim"  . Rosuvastatin Other (See Comments)    SEVERE muscle and leg cramps/aches  . Sulfonamide Derivatives Rash and Other (See Comments)    Also "made the whites of my eyes turn yellow"    Patient Measurements: Height: 5\' 9"  (175.3 cm) Weight: 89.4 kg (197 lb 1.5 oz) IBW/kg (Calculated) : 70.7   Vital Signs: Temp: 98 F (36.7 C) (11/19 1132) Temp Source: Oral (11/19 0355) BP: 137/88 (11/19 1200) Pulse Rate: 89 (11/19 1200)  Labs: Recent Labs    07/27/20 1023 07/27/20 1023 07/28/20 1155 07/28/20 1429 07/29/20 0146 07/29/20 0551 07/29/20 1230  HGB 15.6  15.6   < > 16.0  --  16.7  --   --   HCT 46.0  46.0  --  50.0  --  51.2  --   --   PLT  --   --  171  --  155  --   --   APTT  --   --  31  --   --   --   --   LABPROT  --   --  14.5  --   --   --   --   INR  --   --  1.2  --   --   --   --   HEPARINUNFRC  --   --   --   --  0.36  --  0.47  CREATININE  --   --   --  1.73* 1.81*  --   --   TROPONINIHS  --   --  18* 348*  --  >27,000*  --    < > = values in this interval not displayed.    Estimated Creatinine Clearance: 37.8 mL/min (A) (by C-G formula based on SCr of 1.81 mg/dL (H)).  Assessment: 79 yo male s/p cath with aspiration thrombectomy and balloon angioplasty. Pharmacy consulted to dose heparin for 24-48 hours post sheath removal. Patients confirmatory heparin level is 0.47 which is at goal. No current signs of bleeding noted. CBC is stable.   Goal of Therapy:  Heparin level 0.3-0.7 units/ml Monitor platelets by anticoagulation protocol: Yes   Plan:   -Cont heparin at 1200 units/hr - Daily HL and CBC  - Monitor for s/sx of bleeding  - F/u with MD for LOT of heparin   62, PharmD, MBA Pharmacy Resident [phone] 07/29/2020 1:46  PM

## 2020-07-29 NOTE — Progress Notes (Signed)
°  Echocardiogram 2D Echocardiogram has been performed.  Kevin Flores 07/29/2020, 9:48 AM

## 2020-07-29 NOTE — Progress Notes (Signed)
[id number] Began MI education with pt and wife who voiced understanding. Reviewed importance of plavix. Discussed NTG use, MI restrictions, gave low sodium and heart healthy diets, daily weights, 2000 mg sodium restriction, 2L FR, signs/symptoms of CHF, and CRP 2.  Referred to [facility] CRP 2.  Will follow up tomorrow to continue ed and ambulate.  Luetta Nutting RN BSN 07/29/2020 2:27 PM

## 2020-07-29 NOTE — Progress Notes (Signed)
ANTICOAGULATION CONSULT NOTE   Pharmacy Consult for Heparin  Indication: chest pain/ACS  Allergies  Allergen Reactions  . Imdur [Isosorbide Dinitrate] Other (See Comments)    Headache, "made my head swim"  . Rosuvastatin Other (See Comments)    SEVERE muscle and leg cramps/aches  . Sulfonamide Derivatives Rash and Other (See Comments)    Also "made the whites of my eyes turn yellow"    Patient Measurements: Height: 5\' 9"  (175.3 cm) Weight: 86.2 kg (190 lb) IBW/kg (Calculated) : 70.7   Vital Signs: Temp: 97.8 F (36.6 C) (11/18 2300) Temp Source: Oral (11/18 2300) BP: 125/98 (11/19 0300) Pulse Rate: 89 (11/19 0300)  Labs: Recent Labs    07/27/20 1023 07/27/20 1023 07/28/20 1155 07/28/20 1429 07/29/20 0146  HGB 15.6  15.6   < > 16.0  --  16.7  HCT 46.0  46.0  --  50.0  --  51.2  PLT  --   --  171  --  155  APTT  --   --  31  --   --   LABPROT  --   --  14.5  --   --   INR  --   --  1.2  --   --   HEPARINUNFRC  --   --   --   --  0.36  CREATININE  --   --   --  1.73* 1.81*  TROPONINIHS  --   --  18* 348*  --    < > = values in this interval not displayed.    Estimated Creatinine Clearance: 37.2 mL/min (A) (by C-G formula based on SCr of 1.81 mg/dL (H)).   Medical History: Past Medical History:  Diagnosis Date  . Allergy    seasonal  . Arthritis   . CAD (coronary artery disease)    1998 LIMA to the LAD, SVG to circumflex. DES placed to the PDA 11/2009   . GERD with stricture   . Gout   . Heartburn   . Hyperlipidemia   . Hypertension   . Nephrolithiasis    left  . Personal history of colonic polyps    adenomas  . Thyroid disease     Medications:  Medications Prior to Admission  Medication Sig Dispense Refill Last Dose  . allopurinol (ZYLOPRIM) 300 MG tablet Take 1 tablet (300 mg total) by mouth daily. 90 tablet 3   . aspirin 81 MG chewable tablet Chew 1 tablet (81 mg total) by mouth daily. 90 tablet 3   . atorvastatin (LIPITOR) 20 MG tablet Take  1 tablet (20 mg total) by mouth daily. 90 tablet 3   . budesonide-formoterol (SYMBICORT) 160-4.5 MCG/ACT inhaler Inhale 2 puffs into the lungs in the morning and at bedtime. (Patient taking differently: Inhale 2 puffs into the lungs 2 (two) times daily as needed (shortness of breath). ) 1 each 6   . Carboxymethylcellul-Glycerin (LUBRICATING EYE DROPS OP) Place 1 drop into both eyes daily as needed (dry eyes).     . carvedilol (COREG) 12.5 MG tablet Take 0.5 tablets (6.25 mg total) by mouth 2 (two) times daily. 180 tablet 3   . Cholecalciferol (VITAMIN D-3 PO) Take 1 capsule by mouth daily.     . fish oil-omega-3 fatty acids 1000 MG capsule Take 1 g by mouth at bedtime.      . fluticasone (FLONASE) 50 MCG/ACT nasal spray Place 1 spray into both nostrils daily. 16 g 6   . furosemide (LASIX) 20 MG tablet  TAKE 1 TABLET BY MOUTH EVERY OTHER DAY (Patient taking differently: Take 20 mg by mouth every other day. ) 45 tablet 1   . guaiFENesin (MUCINEX) 600 MG 12 hr tablet Take 2 tablets (1,200 mg total) by mouth 2 (two) times daily as needed for cough or to loosen phlegm.     Marland Kitchen levocetirizine (XYZAL) 5 MG tablet Take 1 tablet (5 mg total) by mouth every evening.     Marland Kitchen levothyroxine (SYNTHROID) 75 MCG tablet TAKE 1 TABLET BY MOUTH EVERY DAY (Patient taking differently: Take 75 mcg by mouth daily. ) 90 tablet 3   . montelukast (SINGULAIR) 10 MG tablet TAKE 1 TABLET BY MOUTH EVERYDAY AT BEDTIME (Patient taking differently: Take 10 mg by mouth at bedtime. ) 90 tablet 3   . pantoprazole (PROTONIX) 40 MG tablet TAKE 1 TABLET BY MOUTH EVERY DAY (Patient taking differently: Take 40 mg by mouth daily. ) 90 tablet 0   . TURMERIC PO Take 2 tablets by mouth daily.      . vitamin C (ASCORBIC ACID) 500 MG tablet Take 500 mg by mouth daily.      Scheduled:  . allopurinol  300 mg Oral Daily  . aspirin  81 mg Oral Daily  . atorvastatin  20 mg Oral Daily  . carvedilol  6.25 mg Oral BID  . Chlorhexidine Gluconate Cloth  6  each Topical Daily  . clopidogrel  75 mg Oral Q breakfast  . fluticasone  1 spray Each Nare Daily  . levothyroxine  75 mcg Oral Q0600  . loratadine  10 mg Oral QPM  . mometasone-formoterol  2 puff Inhalation BID  . montelukast  10 mg Oral QHS  . pantoprazole  40 mg Oral Daily  . sodium chloride flush  3 mL Intravenous Q12H    Assessment: 79 yo male s/p cath with aspiration thrombectomy and balloon angioplasty. Pharmacy consulted to dose heparin (start 4 hours after sheath removal; removed ~ 1:30pm). Plans noted to continue heparin for 24 to 48 hours.  11/19 AM update:  Heparin level therapeutic x 1   Goal of Therapy:  Heparin level 0.3-0.7 units/ml Monitor platelets by anticoagulation protocol: Yes   Plan:   -Cont heparin at 1200 units/hr -1200 heparin level  Abran Duke, PharmD, BCPS Clinical Pharmacist Phone: [phone]

## 2020-07-29 NOTE — Plan of Care (Signed)

## 2020-07-29 NOTE — CV Procedure (Signed)
Central Venous Catheter Insertion Procedure Note Kevin Flores [id number] 29-Aug-1941    Procedure: Insertion of Central Venous Catheter Indications: Drug and/or fluid administration   Procedure Details Consent: Risks of procedure as well as the alternatives and risks of each were explained to the (patient/caregiver).  Consent for procedure obtained. Time Out: Verified patient identification, verified procedure, site/side was marked, verified correct patient position, special equipment/implants available, medications/allergies/relevent history reviewed, required imaging and test results available.  Performed   Maximum sterile technique was used including antiseptics, cap, gloves, gown, hand hygiene, mask and sheet. Skin prep: Chlorhexidine; local anesthetic administered A antimicrobial bonded/coated triple lumen catheter was placed in the right internal jugular vein using the Seldinger technique and u/s guidance.   Evaluation Blood flow good Complications: No apparent complications Patient did tolerate procedure well. Chest X-ray ordered to verify placement.  CXR: pending   Arvilla Meres, MD  7:55 PM

## 2020-07-30 DIAGNOSIS — R570 Cardiogenic shock: Secondary | ICD-10-CM | POA: Diagnosis not present

## 2020-07-30 DIAGNOSIS — I5023 Acute on chronic systolic (congestive) heart failure: Secondary | ICD-10-CM | POA: Diagnosis not present

## 2020-07-30 DIAGNOSIS — I2102 ST elevation (STEMI) myocardial infarction involving left anterior descending coronary artery: Secondary | ICD-10-CM | POA: Diagnosis not present

## 2020-07-30 LAB — BASIC METABOLIC PANEL
Anion gap: 10 (ref 5–15)
Anion gap: 9 (ref 5–15)
BUN: 27 mg/dL — ABNORMAL HIGH (ref 8–23)
BUN: 27 mg/dL — ABNORMAL HIGH (ref 8–23)
CO2: 27 mmol/L (ref 22–32)
CO2: 26 mmol/L (ref 22–32)
Calcium: 9.1 mg/dL (ref 8.9–10.3)
Calcium: 9.2 mg/dL (ref 8.9–10.3)
Chloride: 100 mmol/L (ref 98–111)
Chloride: 103 mmol/L (ref 98–111)
Creatinine, Ser: 2.04 mg/dL — ABNORMAL HIGH (ref 0.61–1.24)
Creatinine, Ser: 1.98 mg/dL — ABNORMAL HIGH (ref 0.61–1.24)
GFR, Estimated: 33 mL/min — ABNORMAL LOW (ref >60)
GFR, Estimated: 34 mL/min — ABNORMAL LOW (ref >60)
Glucose, Bld: 141 mg/dL — ABNORMAL HIGH (ref 70–99)
Glucose, Bld: 151 mg/dL — ABNORMAL HIGH (ref 70–99)
Potassium: 3.5 mmol/L (ref 3.5–5.1)
Potassium: 4.2 mmol/L (ref 3.5–5.1)
Sodium: 137 mmol/L (ref 135–145)
Sodium: 138 mmol/L (ref 135–145)

## 2020-07-30 LAB — LACTIC ACID, PLASMA
Lactic Acid, Venous: 1.5 mmol/L (ref 0.5–1.9)
Lactic Acid, Venous: 1.2 mmol/L (ref 0.5–1.9)

## 2020-07-30 LAB — CBC
HCT: 44.7 % (ref 39.0–52.0)
Hemoglobin: 14.6 g/dL (ref 13.0–17.0)
MCH: 30.4 pg (ref 26.0–34.0)
MCHC: 32.7 g/dL (ref 30.0–36.0)
MCV: 93.1 fL (ref 80.0–100.0)
Platelets: 136 10*3/uL — ABNORMAL LOW (ref 150–400)
RBC: 4.8 MIL/uL (ref 4.22–5.81)
RDW: 14.6 % (ref 11.5–15.5)
WBC: 17.4 10*3/uL — ABNORMAL HIGH (ref 4.0–10.5)
nRBC: 0 % (ref 0.0–0.2)

## 2020-07-30 LAB — HEPARIN LEVEL (UNFRACTIONATED): Heparin Unfractionated: 0.16 IU/mL — ABNORMAL LOW (ref 0.30–0.70)

## 2020-07-30 LAB — COOXEMETRY PANEL
Carboxyhemoglobin: 1.5 % (ref 0.5–1.5)
Methemoglobin: 0.9 % (ref 0.0–1.5)
O2 Saturation: 64.5 %
Total hemoglobin: 15.6 g/dL (ref 12.0–16.0)

## 2020-07-30 MED ORDER — POTASSIUM CHLORIDE 20 MEQ PO PACK
40.0000 meq | PACK | Freq: Once | ORAL | Status: AC
  Administered 2020-07-30: 40 meq via ORAL
  Filled 2020-07-30: qty 2

## 2020-07-30 MED ORDER — POTASSIUM CHLORIDE 20 MEQ/15ML (10%) PO SOLN: 40.0000 meq | Freq: Every day | ORAL | Status: DC

## 2020-07-30 MED ORDER — SODIUM CHLORIDE 0.9% FLUSH: 10.0000 mL | INTRAVENOUS | Status: DC | PRN

## 2020-07-30 MED ORDER — SODIUM CHLORIDE 0.9% FLUSH
10.0000 mL | Freq: Two times a day (BID) | INTRAVENOUS | Status: DC
  Administered 2020-07-30 – 2020-08-11 (×26): 10 mL

## 2020-07-30 MED ORDER — FUROSEMIDE 40 MG PO TABS
40.0000 mg | ORAL_TABLET | Freq: Every day | ORAL | Status: DC
  Administered 2020-07-30 – 2020-07-31 (×2): 40 mg via ORAL
  Filled 2020-07-30 (×2): qty 1

## 2020-07-30 NOTE — Plan of Care (Signed)
  Problem: Education: Goal: Understanding of CV disease, CV risk reduction, and recovery process will improve Outcome: Progressing Goal: Individualized Educational Video(s) Outcome: Progressing   Problem: Cardiovascular: Goal: Ability to achieve and maintain adequate cardiovascular perfusion will improve Outcome: Progressing Goal: Vascular access site(s) Level 0-1 will be maintained Outcome: Progressing   Problem: Education: Goal: Knowledge of General Education information will improve Description: Including pain rating scale, medication(s)/side effects and non-pharmacologic comfort measures Outcome: Progressing   Problem: Clinical Measurements: Goal: Ability to maintain clinical measurements within normal limits will improve Outcome: Progressing Goal: Will remain free from infection Outcome: Progressing Goal: Diagnostic test results will improve Outcome: Progressing Goal: Respiratory complications will improve Outcome: Progressing Goal: Cardiovascular complication will be avoided Outcome: Progressing   Problem: Nutrition: Goal: Adequate nutrition will be maintained Outcome: Progressing   Problem: Coping: Goal: Level of anxiety will decrease Outcome: Progressing   Problem: Elimination: Goal: Will not experience complications related to bowel motility Outcome: Progressing Goal: Will not experience complications related to urinary retention Outcome: Progressing   Problem: Pain Managment: Goal: General experience of comfort will improve Outcome: Progressing   Problem: Safety: Goal: Ability to remain free from injury will improve Outcome: Progressing   Problem: Skin Integrity: Goal: Risk for impaired skin integrity will decrease Outcome: Progressing

## 2020-07-30 NOTE — Progress Notes (Signed)
Secure chat with Dr Gala Romney re PICC order.  States okay to cancel the PICC line since pt has CVC currently.  Rekha RN notified.

## 2020-07-30 NOTE — Progress Notes (Signed)
ANTICOAGULATION CONSULT NOTE   Pharmacy Consult for Heparin  Indication: chest pain/ACS  Allergies  Allergen Reactions  . Imdur [Isosorbide Dinitrate] Other (See Comments)    Headache, "made my head swim"  . Rosuvastatin Other (See Comments)    SEVERE muscle and leg cramps/aches  . Sulfonamide Derivatives Rash and Other (See Comments)    Also "made the whites of my eyes turn yellow"    Patient Measurements: Height: 5\' 9"  (175.3 cm) Weight: 82.1 kg (181 lb) IBW/kg (Calculated) : 70.7   Vital Signs: Temp: 97.7 F (36.5 C) (11/20 0728) Temp Source: Oral (11/20 0400) BP: 110/80 (11/20 0600) Pulse Rate: 91 (11/20 0600)  Labs: Recent Labs    07/28/20 1155 07/28/20 1155 07/28/20 1429 07/29/20 0146 07/29/20 0146 07/29/20 0551 07/29/20 1230 07/29/20 1643 07/29/20 2326 07/30/20 0437  HGB 16.0   < >  --  16.7  --   --   --   --   --  14.6  HCT 50.0  --   --  51.2  --   --   --   --   --  44.7  PLT 171  --   --  155  --   --   --   --   --  136*  APTT 31  --   --   --   --   --   --   --   --   --   LABPROT 14.5  --   --   --   --   --   --   --   --   --   INR 1.2  --   --   --   --   --   --   --   --   --   HEPARINUNFRC  --   --   --  0.36  --   --  0.47  --   --  0.16*  CREATININE  --    < > 1.73* 1.81*   < >  --   --  1.96* 2.04* 1.98*  TROPONINIHS 18*  --  348*  --   --  >27,000*  --   --   --   --    < > = values in this interval not displayed.    Estimated Creatinine Clearance: 31.2 mL/min (A) (by C-G formula based on SCr of 1.98 mg/dL (H)).  Assessment: 79 yo male s/p cath with aspiration thrombectomy and balloon angioplasty. Pharmacy consulted to dose heparin for 24-48 hours post sheath removal.   Heparin level this morning came back subtherapeutic at 0.16, on 1200 units/hr. Hgb 14.6, plt 136. No s/sx of bleeding or infusion issues.  Goal of Therapy:  Heparin level 0.3-0.7 units/ml Monitor platelets by anticoagulation protocol: Yes   Plan:   -Cont  heparin at 1400 units/hr - Order heparin level in 8 hr - Daily HL and CBC  - Monitor for s/sx of bleeding  - F/u with MD for LOT of heparin   64, PharmD, BCCCP Clinical Pharmacist  Phone: [phone] 07/30/2020 8:36 AM  Please check AMION for all [facility] Pharmacy phone numbers After 10:00 PM, call Main Pharmacy [phone]

## 2020-07-30 NOTE — Progress Notes (Addendum)
CARDIAC REHAB PHASE I   PRE:  Rate/Rhythm: 95 SR  BP:  Supine: 112/86  Sitting:   Standing:    SaO2: 97%4L  MODE:  Ambulation: 300 ft   POST:  Rate/Rhythm: 105 ST  BP:  Supine: 127/82  Sitting:   Standing:    SaO2: 97%4L 1348-1422 Pt willing to walk. Pt walked 300 ft on 4L with rolling walker and asst x 1. Pt tolerated well. Slightly SOB at end of walk. Back to bed. Left on 4L. Call bell in reach. Gave CHF booklet and discussed signs to call MD.   Luetta Nutting, RN BSN  07/30/2020 2:18 PM

## 2020-07-30 NOTE — Progress Notes (Signed)
Advanced Heart Failure Rounding Note   Subjective:    Started on milrinone last night for low output HF/shock  Feels better today. Co-ox up to 65%. Lactate has cleared. Creatinine improving.  No further CP. Denies dyspnea, orthopnea or PND.   CVP down to 7-8 on IV lasix.    Objective:   Weight Range:  Vital Signs:   Temp:  [97.7 F (36.5 C)-98.2 F (36.8 C)] 97.7 F (36.5 C) (11/20 0728) Pulse Rate:  [76-98] 91 (11/20 0600) Resp:  [12-36] 28 (11/20 0600) BP: (100-137)/(70-102) 110/80 (11/20 0600) SpO2:  [92 %-100 %] 95 % (11/20 0600) Weight:  [82.1 kg] 82.1 kg (11/20 0453) Last BM Date:  (PTA)  Weight change: Filed Weights   07/28/20 1200 07/29/20 0531 07/30/20 0453  Weight: 86.2 kg 89.4 kg 82.1 kg    Intake/Output:   Intake/Output Summary (Last 24 hours) at 07/30/2020 0915 Last data filed at 07/30/2020 0700 Gross per 24 hour  Intake 325.86 ml  Output 1550 ml  Net -1224.14 ml     Physical Exam: General:  Elderly Well appearing. No resp difficulty HEENT: normal Neck: supple. JRIJ TLC  Carotids 2+ bilat; no bruits. No lymphadenopathy or thryomegaly appreciated. Cor: PMI nondisplaced. Regular rate & rhythm. No rubs, gallops or murmurs. Lungs: clear Abdomen: soft, nontender, nondistended. No hepatosplenomegaly. No bruits or masses. Good bowel sounds. Extremities: no cyanosis, clubbing, rash, edema Neuro: alert & orientedx3, cranial nerves grossly intact. moves all 4 extremities w/o difficulty. Affect pleasant  Telemetry: sinus 90s Personally reviewed  Labs: Basic Metabolic Panel: Recent Labs  Lab 07/28/20 1429 07/28/20 1429 07/29/20 0146 07/29/20 0146 07/29/20 1643 07/29/20 2326 07/30/20 0437  NA 139  --  139  --  135 137 138  K 4.2  --  4.4  --  4.5 3.5 4.2  CL 106  --  107  --  102 100 103  CO2 22  --  22  --  19* 27 26  GLUCOSE 144*  --  122*  --  142* 141* 151*  BUN 19  --  21  --  26* 27* 27*  CREATININE 1.73*  --  1.81*  --  1.96* 2.04*  1.98*  CALCIUM 9.4   < > 9.5   < > 9.6 9.1 9.2   < > = values in this interval not displayed.    Liver Function Tests: Recent Labs  Lab 07/28/20 1429  AST 25  ALT 17  ALKPHOS 84  BILITOT 1.3*  PROT 7.3  ALBUMIN 3.5   No results for input(s): LIPASE, AMYLASE in the last 168 hours. No results for input(s): AMMONIA in the last 168 hours.  CBC: Recent Labs  Lab 07/27/20 1023 07/28/20 1155 07/29/20 0146 07/30/20 0437  WBC  --  8.7 14.0* 17.4*  NEUTROABS  --  6.1  --   --   HGB 15.6  15.6 16.0 16.7 14.6  HCT 46.0  46.0 50.0 51.2 44.7  MCV  --  94.3 93.3 93.1  PLT  --  171 155 136*    Cardiac Enzymes: No results for input(s): CKTOTAL, CKMB, CKMBINDEX, TROPONINI in the last 168 hours.  BNP: BNP (last 3 results) Recent Labs    06/09/20 1012 07/29/20 1643  BNP 774.8* 3,099.8*    ProBNP (last 3 results) No results for input(s): PROBNP in the last 8760 hours.    Other results:  Imaging: CARDIAC CATHETERIZATION  Result Date: 07/28/2020  Balloon angioplasty was performed.  Post intervention, there  is a 100% residual stenosis.  Interval occlusion of the LIMA to LAD graft, suspect atheroembolic event.  Unsuccessful reperfusion using aspiration thrombectomy and balloon angioplasty throughout the course of the LAD and left internal mammary graft. Recommendations: Medical therapy, start clopidogrel, check 2D echo, resume heparin and continue for 24 to 48 hours.  Discussed findings and recommendations for medical therapy with the patient and his family.  I do not think he has any revascularization options as this appears to be a distal embolic event.   DG CHEST PORT 1 VIEW  Result Date: 07/29/2020 CLINICAL DATA:  79 year old male with central line placement. EXAM: PORTABLE CHEST 1 VIEW COMPARISON:  Chest radiograph dated 07/28/2020. FINDINGS: Right IJ central venous line with tip over central SVC. No pneumothorax. No significant interval change in the appearance of the  lungs or cardiomediastinal silhouette. Median sternotomy wires and CABG vascular clips. Atherosclerotic calcification of the aorta. No acute osseous pathology. IMPRESSION: Interval placement of a right IJ central venous line. No pneumothorax. Electronically Signed   By: Elgie Collard M.D.   On: 07/29/2020 20:22   DG Chest Port 1 View  Result Date: 07/28/2020 CLINICAL DATA:  Chest pain. EXAM: PORTABLE CHEST 1 VIEW COMPARISON:  CT 07/14/2020.  Chest x-ray 01/19/2020. FINDINGS: Prior CABG. Stable cardiomegaly. Low lung volumes. No focal infiltrate. No pleural effusion or pneumothorax. IMPRESSION: 1. Prior CABG. Stable cardiomegaly. 2. Low lung volumes. No focal infiltrate. Electronically Signed   By: Maisie Fus  Register   On: 07/28/2020 12:36   ECHOCARDIOGRAM COMPLETE  Result Date: 07/29/2020    ECHOCARDIOGRAM REPORT   Patient Name:   Kevin Flores Date of Exam: 07/29/2020 Medical Rec #:  [id number]    Height:       69.0 in Accession #:    [id number]   Weight:       197.1 lb Date of Birth:  11-15-1940    BSA:          2.053 m Patient Age:    79 years     BP:           131/100 mmHg Patient Gender: M            HR:           86 bpm. Exam Location:  Inpatient Procedure: 2D Echo, Cardiac Doppler, Color Doppler and Intracardiac            Opacification Agent Indications:    R07.9* Chest pain, unspecified  History:        Patient has prior history of Echocardiogram examinations, most                 recent 06/30/2020. CAD, Prior CABG, Signs/Symptoms:Dyspnea; Risk                 Factors:Hypertension, Dyslipidemia and GERD. Thyroid Disease.  Sonographer:    Elmarie Shiley Dance Referring Phys: [id number] MICHAEL COOPER IMPRESSIONS  1. Severely reduced LV function, 10-15%. LVOT VTI 5 cm. Left ventricular ejection fraction, by estimation, is 10-15%. The left ventricle has severely decreased function. The left ventricle demonstrates global hypokinesis. The left ventricular internal cavity size was mildly to moderately dilated. Left  ventricular diastolic parameters are consistent with Grade II diastolic dysfunction (pseudonormalization). Elevated left atrial pressure.  2. Right ventricular systolic function is severely reduced. The right ventricular size is mildly enlarged. There is mildly elevated pulmonary artery systolic pressure. The estimated right ventricular systolic pressure is 47.7 mmHg.  3. Left atrial size was mild to moderately dilated.  4. The mitral valve is grossly normal. Mild to moderate mitral valve regurgitation.  5. The aortic valve is tricuspid. Aortic valve regurgitation is mild. No aortic stenosis is present.  6. The inferior vena cava is dilated in size with >50% respiratory variability, suggesting right atrial pressure of 8 mmHg. Comparison(s): No significant change from prior study. FINDINGS  Left Ventricle: Severely reduced LV function, 10-15%. LVOT VTI 5 cm. Left ventricular ejection fraction, by estimation, is 10-15%. The left ventricle has severely decreased function. The left ventricle demonstrates global hypokinesis. Definity contrast agent was given IV to delineate the left ventricular endocardial borders. The left ventricular internal cavity size was mildly to moderately dilated. There is no left ventricular hypertrophy. Left ventricular diastolic parameters are consistent with Grade II diastolic dysfunction (pseudonormalization). Elevated left atrial pressure. Right Ventricle: The right ventricular size is mildly enlarged. No increase in right ventricular wall thickness. Right ventricular systolic function is severely reduced. There is mildly elevated pulmonary artery systolic pressure. The tricuspid regurgitant velocity is 3.15 m/s, and with an assumed right atrial pressure of 8 mmHg, the estimated right ventricular systolic pressure is 47.7 mmHg. Left Atrium: Left atrial size was mild to moderately dilated. Right Atrium: Right atrial size was normal in size. Pericardium: Trivial pericardial effusion is  present. Mitral Valve: The mitral valve is grossly normal. Mild to moderate mitral valve regurgitation. Tricuspid Valve: The tricuspid valve is grossly normal. Tricuspid valve regurgitation is trivial. No evidence of tricuspid stenosis. Aortic Valve: The aortic valve is tricuspid. Aortic valve regurgitation is mild. Aortic regurgitation PHT measures 460 msec. No aortic stenosis is present. Pulmonic Valve: The pulmonic valve was grossly normal. Pulmonic valve regurgitation is trivial. No evidence of pulmonic stenosis. Aorta: The aortic root and ascending aorta are structurally normal, with no evidence of dilitation. Venous: The inferior vena cava is dilated in size with greater than 50% respiratory variability, suggesting right atrial pressure of 8 mmHg. IAS/Shunts: The atrial septum is grossly normal. Additional Comments: There is a small pleural effusion in the left lateral region.  LEFT VENTRICLE PLAX 2D LVIDd:         6.00 cm  Diastology LVIDs:         5.50 cm  LV e' medial:    4.26 cm/s LV PW:         1.60 cm  LV E/e' medial:  23.9 LV IVS:        0.80 cm  LV e' lateral:   4.74 cm/s LVOT diam:     2.00 cm  LV E/e' lateral: 21.5 LV SV:         16 LV SV Index:   8 LVOT Area:     3.14 cm  RIGHT VENTRICLE            IVC RV Basal diam:  3.70 cm    IVC diam: 2.30 cm RV Mid diam:    2.60 cm RV S prime:     3.09 cm/s TAPSE (M-mode): 1.1 cm LEFT ATRIUM              Index       RIGHT ATRIUM           Index LA diam:        5.10 cm  2.48 cm/m  RA Area:     18.70 cm LA Vol (A2C):   111.0 ml 54.06 ml/m RA Volume:   53.50 ml  26.06 ml/m LA Vol (A4C):   57.6 ml  28.05 ml/m LA  Biplane Vol: 85.2 ml  41.50 ml/m  AORTIC VALVE LVOT Vmax:   38.35 cm/s LVOT Vmean:  24.000 cm/s LVOT VTI:    0.049 m AI PHT:      460 msec  AORTA Ao Root diam: 3.90 cm Ao Asc diam:  3.90 cm MITRAL VALVE                TRICUSPID VALVE MV Area (PHT): 5.54 cm     TR Peak grad:   39.7 mmHg MV Decel Time: 137 msec     TR Vmax:        315.00 cm/s MV E  velocity: 102.00 cm/s MV A velocity: 74.90 cm/s   SHUNTS MV E/A ratio:  1.36         Systemic VTI:  0.05 m                             Systemic Diam: 2.00 cm Lennie Odor MD Electronically signed by Lennie Odor MD Signature Date/Time: 07/29/2020/11:05:51 AM    Final    [place] EKG SITE RITE  Result Date: 07/29/2020 If Site Rite image not attached, placement could not be confirmed due to current cardiac rhythm.     Medications:     Scheduled Medications: . allopurinol  300 mg Oral Daily  . aspirin  81 mg Oral Daily  . atorvastatin  20 mg Oral Daily  . Chlorhexidine Gluconate Cloth  6 each Topical Daily  . clopidogrel  75 mg Oral Q breakfast  . fluticasone  1 spray Each Nare Daily  . furosemide  80 mg Intravenous Q12H  . levothyroxine  75 mcg Oral Q0600  . loratadine  10 mg Oral QPM  . mometasone-formoterol  2 puff Inhalation BID  . montelukast  10 mg Oral QHS  . pantoprazole  40 mg Oral Daily  . sodium chloride flush  10-40 mL Intracatheter Q12H  . sodium chloride flush  3 mL Intravenous Q12H     Infusions: . sodium chloride 20 mL/hr at 07/28/20 1205  . sodium chloride    . heparin 1,400 Units/hr (07/30/20 0906)  . milrinone 0.25 mcg/kg/min (07/30/20 0600)     PRN Medications:  sodium chloride, acetaminophen, alum & mag hydroxide-simeth, morphine injection, ondansetron (ZOFRAN) IV, oxyCODONE, sodium chloride flush, sodium chloride flush   Assessment/Plan:   1. Acute on chronic systolic HF -> cardiogenic shock - echo 3/19 EF 50-55% - echo 10/21 EF < 20% RV moderately reduced - echo 11/21 EF < 10-15 % RV moderately reduced mild to moderate MR - now with post MI shock with increasing lactate and AKI - co-ox up to 65% on milrinone 0.25. Will continue - continue to hold b-blockerb-blocker - diuresed well with IV lasix. CVP 7-8. Can switch to po - in looking at cath films suspect initial decrease in EF from 3/19 -> 10/21 was nonischemic in nature (?PVCs) but no situation  c/b large anterior infract with LIMA occlusion - will need to assess for candidacy of LVAD. Will hold plavix for now. Continue inotropic support to maximize renal perfusion.  - Mobilize aggressively with PT/OT  2. CAD with acute anterior infarct due to thrombotic LIMA occlusion - on heparin, ASA, statin - Will continue for 48 hours post-event then switch to DVT prophylaxis. Discussed dosing with PharmD personally. - no -blocker with shock  3. AKI on CKD 3b - baseline creatinine 1.6-1.87. peaked at 2.04 - due to ATN/shock - now 1.98 with  milrinone - Continue inotropic support to maximize renal perfusion.   4. Leukocytosis - no infectious symptoms - suspect due to MI  CRITICAL CARE Performed by: Arvilla Meres  Total critical care time: 35 minutes  Critical care time was exclusive of separately billable procedures and treating other patients.  Critical care was necessary to treat or prevent imminent or life-threatening deterioration.  Critical care was time spent personally by me (independent of midlevel providers or residents) on the following activities: development of treatment plan with patient and/or surrogate as well as nursing, discussions with consultants, evaluation of patient's response to treatment, examination of patient, obtaining history from patient or surrogate, ordering and performing treatments and interventions, ordering and review of laboratory studies, ordering and review of radiographic studies, pulse oximetry and re-evaluation of patient's condition.     Length of Stay: 2   [name] 07/30/2020, 9:15 AM  Advanced Heart Failure Team Pager [phone] (M-F; 7a - 4p)  Please contact [facility] for night-coverage after hours (4p -7a ) and weekends on [email or web address]

## 2020-07-31 DIAGNOSIS — I2102 ST elevation (STEMI) myocardial infarction involving left anterior descending coronary artery: Secondary | ICD-10-CM | POA: Diagnosis not present

## 2020-07-31 DIAGNOSIS — I5023 Acute on chronic systolic (congestive) heart failure: Secondary | ICD-10-CM | POA: Diagnosis not present

## 2020-07-31 LAB — CBC
HCT: 40.6 % (ref 39.0–52.0)
Hemoglobin: 13.4 g/dL (ref 13.0–17.0)
MCH: 29.9 pg (ref 26.0–34.0)
MCHC: 33 g/dL (ref 30.0–36.0)
MCV: 90.6 fL (ref 80.0–100.0)
Platelets: 127 10*3/uL — ABNORMAL LOW (ref 150–400)
RBC: 4.48 MIL/uL (ref 4.22–5.81)
RDW: 14.3 % (ref 11.5–15.5)
WBC: 17.8 10*3/uL — ABNORMAL HIGH (ref 4.0–10.5)
nRBC: 0 % (ref 0.0–0.2)

## 2020-07-31 LAB — BASIC METABOLIC PANEL
Anion gap: 12 (ref 5–15)
BUN: 30 mg/dL — ABNORMAL HIGH (ref 8–23)
CO2: 23 mmol/L (ref 22–32)
Calcium: 8.8 mg/dL — ABNORMAL LOW (ref 8.9–10.3)
Chloride: 100 mmol/L (ref 98–111)
Creatinine, Ser: 1.98 mg/dL — ABNORMAL HIGH (ref 0.61–1.24)
GFR, Estimated: 34 mL/min — ABNORMAL LOW (ref >60)
Glucose, Bld: 119 mg/dL — ABNORMAL HIGH (ref 70–99)
Potassium: 3.9 mmol/L (ref 3.5–5.1)
Sodium: 135 mmol/L (ref 135–145)

## 2020-07-31 LAB — COOXEMETRY PANEL
Carboxyhemoglobin: 1.6 % — ABNORMAL HIGH (ref 0.5–1.5)
Methemoglobin: 1 % (ref 0.0–1.5)
O2 Saturation: 80.8 %
Total hemoglobin: 12.8 g/dL (ref 12.0–16.0)

## 2020-07-31 MED ORDER — POLYETHYLENE GLYCOL 3350 17 G PO PACK
17.0000 g | PACK | Freq: Every day | ORAL | Status: DC
  Administered 2020-08-01 – 2020-08-09 (×6): 17 g via ORAL
  Filled 2020-07-31 (×10): qty 1

## 2020-07-31 MED ORDER — HEPARIN SODIUM (PORCINE) 5000 UNIT/ML IJ SOLN
5000.0000 [IU] | Freq: Three times a day (TID) | INTRAMUSCULAR | Status: DC
  Administered 2020-07-31 – 2020-08-05 (×15): 5000 [IU] via SUBCUTANEOUS
  Filled 2020-07-31 (×15): qty 1

## 2020-07-31 MED ORDER — SENNOSIDES-DOCUSATE SODIUM 8.6-50 MG PO TABS
1.0000 | ORAL_TABLET | Freq: Two times a day (BID) | ORAL | Status: AC
  Administered 2020-07-31: 1 via ORAL
  Filled 2020-07-31: qty 1

## 2020-07-31 MED ORDER — FUROSEMIDE 10 MG/ML IJ SOLN
80.0000 mg | Freq: Two times a day (BID) | INTRAMUSCULAR | Status: AC
  Administered 2020-07-31 (×2): 80 mg via INTRAVENOUS
  Filled 2020-07-31 (×2): qty 8

## 2020-07-31 MED ORDER — POTASSIUM CHLORIDE 20 MEQ PO PACK: 40.0000 meq | PACK | Freq: Once | ORAL | Status: DC

## 2020-07-31 MED ORDER — POTASSIUM CHLORIDE CRYS ER 20 MEQ PO TBCR
40.0000 meq | EXTENDED_RELEASE_TABLET | Freq: Once | ORAL | Status: AC
  Administered 2020-07-31: 40 meq via ORAL
  Filled 2020-07-31: qty 2

## 2020-07-31 MED ORDER — TORSEMIDE 20 MG PO TABS
40.0000 mg | ORAL_TABLET | Freq: Every day | ORAL | Status: DC
  Administered 2020-08-01: 40 mg via ORAL
  Filled 2020-07-31: qty 2

## 2020-07-31 NOTE — Progress Notes (Addendum)
Advanced Heart Failure Rounding Note   Subjective:    Started on milrinone 11/19 for low output HF/shock  Denies CP. Still weak. Walked with CR yesterday and was SOB easily.Kevin Flores   Co-ox 81% (?) on milrinone 0.25. Creatinine stable at 1.98. CVP back up to 17 on po lasix.    Objective:   Weight Range:  Vital Signs:   Temp:  [97.7 F (36.5 C)-100 F (37.8 C)] 97.7 F (36.5 C) (11/21 0739) Pulse Rate:  [91-99] 99 (11/21 0722) Resp:  [16-34] 19 (11/21 0700) BP: (91-119)/(58-86) 101/67 (11/21 0722) SpO2:  [94 %-99 %] 94 % (11/21 0739) FiO2 (%):  [28 %] 28 % (11/21 0739) Weight:  [86.4 kg] 86.4 kg (11/21 0619) Last BM Date:  (PTA)  Weight change: Filed Weights   07/29/20 0531 07/30/20 0453 07/31/20 0619  Weight: 89.4 kg 82.1 kg 86.4 kg    Intake/Output:   Intake/Output Summary (Last 24 hours) at 07/31/2020 1054 Last data filed at 07/31/2020 0800 Gross per 24 hour  Intake 579.88 ml  Output 1125 ml  Net -545.12 ml     Physical Exam: General:  Elderly Well appearing. No resp difficulty HEENT: normal Neck: supple. RIJ TLC Carotids 2+ bilat; no bruits. No lymphadenopathy or thryomegaly appreciated. Cor: PMI nondisplaced. Regular rate & rhythm. No rubs, gallops or murmurs. Lungs: clear Abdomen: soft, nontender, nondistended. No hepatosplenomegaly. No bruits or masses. Good bowel sounds. Extremities: no cyanosis, clubbing, rash, edema Neuro: alert & orientedx3, cranial nerves grossly intact. moves all 4 extremities w/o difficulty. Affect pleasant   Telemetry: sinus 90-100 Personally reviewed  Labs: Basic Metabolic Panel: Recent Labs  Lab 07/29/20 0146 07/29/20 0146 07/29/20 1643 07/29/20 1643 07/29/20 2326 07/30/20 0437 07/31/20 0052  NA 139  --  135  --  137 138 135  K 4.4  --  4.5  --  3.5 4.2 3.9  CL 107  --  102  --  100 103 100  CO2 22  --  19*  --  27 26 23   GLUCOSE 122*  --  142*  --  141* 151* 119*  BUN 21  --  26*  --  27* 27* 30*  CREATININE  1.81*  --  1.96*  --  2.04* 1.98* 1.98*  CALCIUM 9.5   < > 9.6   < > 9.1 9.2 8.8*   < > = values in this interval not displayed.    Liver Function Tests: Recent Labs  Lab 07/28/20 1429  AST 25  ALT 17  ALKPHOS 84  BILITOT 1.3*  PROT 7.3  ALBUMIN 3.5   No results for input(s): LIPASE, AMYLASE in the last 168 hours. No results for input(s): AMMONIA in the last 168 hours.  CBC: Recent Labs  Lab 07/27/20 1023 07/28/20 1155 07/29/20 0146 07/30/20 0437 07/31/20 0052  WBC  --  8.7 14.0* 17.4* 17.8*  NEUTROABS  --  6.1  --   --   --   HGB 15.6  15.6 16.0 16.7 14.6 13.4  HCT 46.0  46.0 50.0 51.2 44.7 40.6  MCV  --  94.3 93.3 93.1 90.6  PLT  --  171 155 136* 127*    Cardiac Enzymes: No results for input(s): CKTOTAL, CKMB, CKMBINDEX, TROPONINI in the last 168 hours.  BNP: BNP (last 3 results) Recent Labs    06/09/20 1012 07/29/20 1643  BNP 774.8* 3,099.8*    ProBNP (last 3 results) No results for input(s): PROBNP in the last 8760 hours.  Other results:  Imaging: DG CHEST PORT 1 VIEW  Result Date: 07/29/2020 CLINICAL DATA:  79 year old male with central line placement. EXAM: PORTABLE CHEST 1 VIEW COMPARISON:  Chest radiograph dated 07/28/2020. FINDINGS: Right IJ central venous line with tip over central SVC. No pneumothorax. No significant interval change in the appearance of the lungs or cardiomediastinal silhouette. Median sternotomy wires and CABG vascular clips. Atherosclerotic calcification of the aorta. No acute osseous pathology. IMPRESSION: Interval placement of a right IJ central venous line. No pneumothorax. Electronically Signed   By: Elgie Collard M.D.   On: 07/29/2020 20:22   [place] EKG SITE RITE  Result Date: 07/29/2020 If Site Rite image not attached, placement could not be confirmed due to current cardiac rhythm.    Medications:     Scheduled Medications: . allopurinol  300 mg Oral Daily  . aspirin  81 mg Oral Daily  . atorvastatin  20  mg Oral Daily  . Chlorhexidine Gluconate Cloth  6 each Topical Daily  . fluticasone  1 spray Each Nare Daily  . furosemide  40 mg Oral Daily  . levothyroxine  75 mcg Oral Q0600  . loratadine  10 mg Oral QPM  . mometasone-formoterol  2 puff Inhalation BID  . montelukast  10 mg Oral QHS  . pantoprazole  40 mg Oral Daily  . sodium chloride flush  10-40 mL Intracatheter Q12H  . sodium chloride flush  3 mL Intravenous Q12H    Infusions: . sodium chloride 20 mL/hr at 07/28/20 1205  . sodium chloride    . milrinone 0.25 mcg/kg/min (07/31/20 0757)    PRN Medications: sodium chloride, acetaminophen, alum & mag hydroxide-simeth, morphine injection, ondansetron (ZOFRAN) IV, oxyCODONE, sodium chloride flush, sodium chloride flush   Assessment/Plan:   1. Acute on chronic systolic HF -> cardiogenic shock - echo 3/19 EF 50-55% - echo 10/21 EF < 20% RV moderately reduced - echo 11/21 EF < 10-15 % RV moderately reduced mild to moderate MR - now with post MI shock with increasing lactate and AKI - co-ox up to 81% on milrinone 0.25. ? Accurate. Continue milrinone with ongoing IV diuresis - volume status back up. CVP 17. Resume IV lasix  - No ARB/ARNI with AKI.  - No b-blocker yet with shock  - Add Jardiance soon (don't want to bump creatinine until we are certain it has nadired) - in looking at cath films suspect initial decrease in EF from 3/19 -> 10/21 was nonischemic in nature (?PVCs) but no situation c/b large anterior infract with LIMA occlusion - will need to assess for candidacy of LVAD. Will hold plavix for now. Continue inotropic support to maximize renal perfusion.  - Mobilize aggressively with PT/OT  2. 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 Continue ASA, statin - no -blocker with shock  3. AKI on CKD 3b - baseline creatinine 1.6-1.87. peaked at 2.04 - due to ATN/shock - stable at 1.98 with milrinone - Continue inotropic support to  maximize renal perfusion.   4. Leukocytosis - 17.4K -> 17.8K  - no infectious symptoms - suspect due to MI - encouraged incentive spirometry     Length of Stay: 3   [name] 07/31/2020, 10:54 AM  Advanced Heart Failure Team Pager [phone] (M-F; 7a - 4p)  Please contact [facility] for night-coverage after hours (4p -7a ) and weekends on [email or web address]

## 2020-07-31 NOTE — Care Management (Signed)
Benefit check sent for Jardiance. It will result Monday.

## 2020-07-31 NOTE — Evaluation (Signed)
Physical Therapy Evaluation Patient Details Name: Kevin Flores MRN: [id number] DOB: 1941-03-17 Today's Date: 07/31/2020   History of Present Illness  79 y.o. male with history of coronary artery disease status post remote CABG and prior PCI procedures.  He underwent cardiac catheterization 07/27/2020 because of progressive LV systolic dysfunction.  He was found to have no targets for PCI with recommendation for ongoing medical therapy.  At 10:00 on 07/28/2020 he developed acute onset of substernal chest pain and after arrival in the emergency department his EKG evolved into an anterior STEMI. Pt underwent emergent cardiac cath on 11/18 with balloon angioplasty.  Clinical Impression  Pt presents to PT with deficits in gait, balance, endurance, power, and cardiopulmonary function. Pt is currently weaker than baseline, utilizing UE support to improve balance and activity tolerance when ambulating. Pt is less steady without UE support, however may progress to not needing an assistive device for ambulation if aggressively mobilized during this admission. Pt currently requires supplemental oxygen at rest and with mobility as documented below. Pt will continue to benefit from acute PT services to improve activity tolerance and dynamic balance. PT anticipates no PT needs at the time of discharge, PT recommends the pt receive a 4 wheeled walker with seat if discharging today.    Follow Up Recommendations No PT follow up;Supervision - Intermittent    Equipment Recommendations  Other (comment) (4 wheeled walker with seat)    Recommendations for Other Services       Precautions / Restrictions Precautions Precautions: Fall Restrictions Weight Bearing Restrictions: No      Mobility  Bed Mobility Overal bed mobility: Modified Independent             General bed mobility comments: increased time, HOB elevated    Transfers Overall transfer level: Needs assistance Equipment used: Rolling  walker (2 wheeled) Transfers: Sit to/from Stand Sit to Stand: Supervision         General transfer comment: stand to sit with poor control of descent  Ambulation/Gait Ambulation/Gait assistance: Supervision Gait Distance (Feet): 150 Feet (150' with RW, 30' without device) Assistive device: Rolling walker (2 wheeled);None Gait Pattern/deviations: Step-through pattern Gait velocity: reduced Gait velocity interpretation: <1.8 ft/sec, indicate of risk for recurrent falls General Gait Details: pt with steady step through gait with use of RW, shortened step length and reduced gait speed as well as high guard without use of device  Stairs            Wheelchair Mobility    Modified Rankin (Stroke Patients Only)       Balance Overall balance assessment: Needs assistance Sitting-balance support: No upper extremity supported;Feet supported Sitting balance-Leahy Scale: Good     Standing balance support: No upper extremity supported Standing balance-Leahy Scale: Fair                               Pertinent Vitals/Pain Pain Assessment: Faces Faces Pain Scale: Hurts little more Pain Location: neck at IJ site Pain Descriptors / Indicators: Sore Pain Intervention(s): Monitored during session    Home Living Family/patient expects to be discharged to:: Private residence Living Arrangements: Spouse/significant other Available Help at Discharge: Family;Available 24 hours/day Type of Home: House Home Access: Stairs to enter Entrance Stairs-Rails: None Entrance Stairs-Number of Steps: 1 Home Layout: One level Home Equipment: Walker - 2 wheels;Cane - single point      Prior Function Level of Independence: Independent  Comments: pt likes to golf, works in golf shop 4 hours a week     Hand Dominance        Extremity/Trunk Assessment   Upper Extremity Assessment Upper Extremity Assessment: Overall WFL for tasks assessed    Lower Extremity  Assessment Lower Extremity Assessment: Generalized weakness    Cervical / Trunk Assessment Cervical / Trunk Assessment: Normal  Communication   Communication: No difficulties  Cognition Arousal/Alertness: Awake/alert Behavior During Therapy: WFL for tasks assessed/performed Overall Cognitive Status: Within Functional Limits for tasks assessed                                        General Comments General comments (skin integrity, edema, etc.): pt on 2L [place] upon PT arrival, sats in mid-90s. Weaned to room air at rest with desat to 88%. Pt ambulates on 2L [place] with sats in mid 90s, 1L [place] with sats 92-95% with use of RW, desats while on 1L [place] without RW to 90%.    Exercises     Assessment/Plan    PT Assessment Patient needs continued PT services  PT Problem List Decreased strength;Decreased activity tolerance;Decreased balance;Decreased mobility;Decreased knowledge of use of DME;Cardiopulmonary status limiting activity       PT Treatment Interventions DME instruction;Gait training;Stair training;Functional mobility training;Therapeutic activities;Therapeutic exercise;Balance training;Patient/family education    PT Goals (Current goals can be found in the Care Plan section)  Acute Rehab PT Goals Patient Stated Goal: To return to baseline PT Goal Formulation: With patient Time For Goal Achievement: Aug 30, 2020 Potential to Achieve Goals: Good    Frequency Min 3X/week   Barriers to discharge        Co-evaluation               AM-PAC PT "6 Clicks" Mobility  Outcome Measure Help needed turning from your back to your side while in a flat bed without using bedrails?: None Help needed moving from lying on your back to sitting on the side of a flat bed without using bedrails?: None Help needed moving to and from a bed to a chair (including a wheelchair)?: None Help needed standing up from a chair using your arms (e.g., wheelchair or bedside chair)?: None Help  needed to walk in hospital room?: None Help needed climbing 3-5 steps with a railing? : A Little 6 Click Score: 23    End of Session Equipment Utilized During Treatment: Oxygen Activity Tolerance: Patient tolerated treatment well Patient left: in bed;with call bell/phone within reach;with bed alarm set Nurse Communication: Mobility status PT Visit Diagnosis: Other abnormalities of gait and mobility (R26.89)    Time: 4163-8453 PT Time Calculation (min) (ACUTE ONLY): 23 min   Charges:   PT Evaluation $PT Eval Moderate Complexity: 1 Mod PT Treatments $Gait Training: 8-22 mins        Arlyss Gandy, PT, DPT Acute Rehabilitation Pager: [phone]   Arlyss Gandy 07/31/2020, 8:57 AM

## 2020-08-01 DIAGNOSIS — I2102 ST elevation (STEMI) myocardial infarction involving left anterior descending coronary artery: Secondary | ICD-10-CM | POA: Diagnosis not present

## 2020-08-01 DIAGNOSIS — N1832 Chronic kidney disease, stage 3b: Secondary | ICD-10-CM | POA: Diagnosis not present

## 2020-08-01 DIAGNOSIS — I5023 Acute on chronic systolic (congestive) heart failure: Secondary | ICD-10-CM | POA: Diagnosis not present

## 2020-08-01 LAB — BASIC METABOLIC PANEL
Anion gap: 12 (ref 5–15)
Anion gap: 13 (ref 5–15)
BUN: 32 mg/dL — ABNORMAL HIGH (ref 8–23)
BUN: 32 mg/dL — ABNORMAL HIGH (ref 8–23)
CO2: 27 mmol/L (ref 22–32)
CO2: 27 mmol/L (ref 22–32)
Calcium: 9.2 mg/dL (ref 8.9–10.3)
Calcium: 9 mg/dL (ref 8.9–10.3)
Chloride: 98 mmol/L (ref 98–111)
Chloride: 97 mmol/L — ABNORMAL LOW (ref 98–111)
Creatinine, Ser: 2.05 mg/dL — ABNORMAL HIGH (ref 0.61–1.24)
Creatinine, Ser: 2.09 mg/dL — ABNORMAL HIGH (ref 0.61–1.24)
GFR, Estimated: 33 mL/min — ABNORMAL LOW (ref >60)
GFR, Estimated: 32 mL/min — ABNORMAL LOW (ref >60)
Glucose, Bld: 125 mg/dL — ABNORMAL HIGH (ref 70–99)
Glucose, Bld: 117 mg/dL — ABNORMAL HIGH (ref 70–99)
Potassium: 3.7 mmol/L (ref 3.5–5.1)
Potassium: 3.8 mmol/L (ref 3.5–5.1)
Sodium: 137 mmol/L (ref 135–145)
Sodium: 137 mmol/L (ref 135–145)

## 2020-08-01 LAB — COOXEMETRY PANEL
Carboxyhemoglobin: 1.5 % (ref 0.5–1.5)
Methemoglobin: 0.8 % (ref 0.0–1.5)
O2 Saturation: 67.5 %
Total hemoglobin: 13.9 g/dL (ref 12.0–16.0)

## 2020-08-01 LAB — CBC
HCT: 41.6 % (ref 39.0–52.0)
HCT: 40.8 % (ref 39.0–52.0)
Hemoglobin: 13.9 g/dL (ref 13.0–17.0)
Hemoglobin: 13.4 g/dL (ref 13.0–17.0)
MCH: 30.5 pg (ref 26.0–34.0)
MCH: 30.5 pg (ref 26.0–34.0)
MCHC: 33.4 g/dL (ref 30.0–36.0)
MCHC: 32.8 g/dL (ref 30.0–36.0)
MCV: 91.2 fL (ref 80.0–100.0)
MCV: 92.9 fL (ref 80.0–100.0)
Platelets: 132 10*3/uL — ABNORMAL LOW (ref 150–400)
Platelets: 129 10*3/uL — ABNORMAL LOW (ref 150–400)
RBC: 4.56 MIL/uL (ref 4.22–5.81)
RBC: 4.39 MIL/uL (ref 4.22–5.81)
RDW: 14.6 % (ref 11.5–15.5)
RDW: 14.4 % (ref 11.5–15.5)
WBC: 14.4 10*3/uL — ABNORMAL HIGH (ref 4.0–10.5)
WBC: 13.7 10*3/uL — ABNORMAL HIGH (ref 4.0–10.5)
nRBC: 0 % (ref 0.0–0.2)
nRBC: 0 % (ref 0.0–0.2)

## 2020-08-01 MED ORDER — POTASSIUM CHLORIDE CRYS ER 20 MEQ PO TBCR
40.0000 meq | EXTENDED_RELEASE_TABLET | Freq: Once | ORAL | Status: AC
  Administered 2020-08-01: 40 meq via ORAL
  Filled 2020-08-01: qty 2

## 2020-08-01 MED ORDER — MILRINONE LACTATE IN DEXTROSE 20-5 MG/100ML-% IV SOLN: 0.1250 ug/kg/min | INTRAVENOUS | Status: DC

## 2020-08-01 NOTE — TOC Benefit Eligibility Note (Signed)
Transition of Care [facility]) Benefit Eligibility Note    Patient Details  Name: Kevin Flores MRN: [id number] Date of Birth: 02-May-1941   Medication/Dose: Jardiance 2m. 122m  Covered?: Yes  Tier: 3 Drug  Prescription Coverage [facility]erred [facility]  Spoke with Person/Company/Phone Number:: [place]# [phone]-Pay: $35.00  Prior Approval: No (PPA required for 50 mg.)  Deductible: Met       HaShelda Alteshone Number: 08/01/2020, 10:04 AM

## 2020-08-01 NOTE — Evaluation (Signed)
Occupational Therapy Evaluation Patient Details Name: Kevin Flores MRN: [id number] DOB: 14-Feb-1941 Today's Date: 08/01/2020    History of Present Illness 79 y.o. male with history of coronary artery disease status post remote CABG and prior PCI procedures.  He underwent cardiac catheterization 07/27/2020 because of progressive LV systolic dysfunction.  He was found to have no targets for PCI with recommendation for ongoing medical therapy.  At 10:00 on 07/28/2020 he developed acute onset of substernal chest pain and after arrival in the emergency department his EKG evolved into an anterior STEMI. Pt underwent emergent cardiac cath on 11/18 with balloon angioplasty.   Clinical Impression   PTA, pt was living at home with his wife, pt reports he was independent with ADL/IADL and functional mobility. Pt reports he enjoys golf and works at [employer]. Pt currently requires supervision to minguard assistance with ADL completion. He demonstrates limitations with activity tolerance/endurance, strength and cardiopulmonary limitations. Pt completed grooming at sink level, initially on 2lnc SpO2 >95%, pt removed nasal canula for washing face and desated to 89% RA, provided supplemental O2 quickly returned SpO2 >94% on 2lnc. Began education on energy conservation strategies with ADL and functional mobility. Due to decline in current level of function, pt would benefit from acute OT to address established goals to facilitate safe D/C to venue listed below. At this time, do not anticipate pt will require follow-up OT services. Will continue to follow acutely.     Follow Up Recommendations  Supervision - Intermittent;No OT follow up    Equipment Recommendations  3 in 1 bedside commode    Recommendations for Other Services       Precautions / Restrictions Precautions Precautions: Fall Restrictions Weight Bearing Restrictions: No      Mobility Bed Mobility               General bed mobility  comments: pt sitting in recliner upon arrival    Transfers Overall transfer level: Needs assistance Equipment used: Rolling walker (2 wheeled) Transfers: Sit to/from Stand Sit to Stand: Min guard         General transfer comment: minguard for safety and vc for safe hand placement    Balance Overall balance assessment: Needs assistance Sitting-balance support: No upper extremity supported;Feet supported Sitting balance-Leahy Scale: Good     Standing balance support: Single extremity supported;During functional activity Standing balance-Leahy Scale: Fair Standing balance comment: demonstrated preference for at least single UE support, able to tolerate standing without UE support for short duration                           ADL either performed or assessed with clinical judgement   ADL Overall ADL's : Needs assistance/impaired Eating/Feeding: Set up;Sitting   Grooming: Min guard;Standing Grooming Details (indicate cue type and reason): completed oral care, wash face and hands at sink level Upper Body Bathing: Set up;Sitting   Lower Body Bathing: Min guard;Sit to/from stand   Upper Body Dressing : Set up;Sitting   Lower Body Dressing: Min guard;Sit to/from stand   Toilet Transfer: Min guard;Ambulation;RW Toilet Transfer Details (indicate cue type and reason): simulated in room ambulation Toileting- Clothing Manipulation and Hygiene: Min guard;Sit to/from stand       Functional mobility during ADLs: Min guard;Rolling walker General ADL Comments: pt demonstrates limitations with activity tolerance, weakness, and decreased endurance     Vision Baseline Vision/History: Wears glasses Wears Glasses: At all times Patient Visual Report: No change from  baseline Vision Assessment?: No apparent visual deficits     Perception     Praxis      Pertinent Vitals/Pain Pain Assessment: No/denies pain Pain Intervention(s): Monitored during session     Hand  Dominance     Extremity/Trunk Assessment Upper Extremity Assessment Upper Extremity Assessment: Overall WFL for tasks assessed   Lower Extremity Assessment Lower Extremity Assessment: Generalized weakness   Cervical / Trunk Assessment Cervical / Trunk Assessment: Normal   Communication Communication Communication: No difficulties   Cognition Arousal/Alertness: Awake/alert Behavior During Therapy: WFL for tasks assessed/performed Overall Cognitive Status: Within Functional Limits for tasks assessed                                     General Comments  high run of PVCs noted on tele at end of session;Pt on 2lnc SpO2 >95% pt removed O2 for washing face while standing at sink level, desat to 89%, returned SpO2 >94% with 2lnc    Exercises     Shoulder Instructions      Home Living Family/patient expects to be discharged to:: Private residence Living Arrangements: Spouse/significant other Available Help at Discharge: Family;Available 24 hours/day Type of Home: House Home Access: Stairs to enter [employer] of Steps: 1 Entrance Stairs-Rails: None Home Layout: One level     Bathroom Shower/Tub: Producer, television/film/video: Standard     Home Equipment: Environmental consultant - 2 wheels;Cane - single point          Prior Functioning/Environment Level of Independence: Independent        Comments: pt likes to golf, 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        OT Problem List: Decreased activity tolerance;Impaired balance (sitting and/or standing);Decreased safety awareness;Cardiopulmonary status limiting activity      OT Treatment/Interventions: Self-care/ADL training;Therapeutic exercise;Energy conservation;DME and/or AE instruction;Therapeutic activities;Patient/family education;Balance training    OT Goals(Current goals can be found in the care plan section) Acute Rehab OT Goals Patient Stated Goal: To return to baseline OT Goal Formulation: With  patient Time For Goal Achievement: 08/15/20 Potential to Achieve Goals: Good ADL Goals Pt Will Perform Grooming: with modified independence;standing Pt Will Perform Lower Body Dressing: with modified independence;sit to/from stand Pt Will Transfer to Toilet: with modified independence;ambulating Additional ADL Goal #1: Pt will demonstrate independence with 3 energy conservation strategies with ADL completion and functional mobility.  OT Frequency: Min 2X/week   Barriers to D/C:            Co-evaluation              AM-PAC OT "6 Clicks" Daily Activity     Outcome Measure Help from another person eating meals?: A Little Help from another person taking care of personal grooming?: A Little Help from another person toileting, which includes using toliet, bedpan, or urinal?: A Little Help from another person bathing (including washing, rinsing, drying)?: A Little Help from another person to put on and taking off regular upper body clothing?: A Little Help from another person to put on and taking off regular lower body clothing?: A Little 6 Click Score: 18   End of Session Equipment Utilized During Treatment: Rolling walker;Oxygen Nurse Communication: Mobility status  Activity Tolerance: Patient tolerated treatment well Patient left: in chair;with call bell/phone within reach;with family/visitor present  OT Visit Diagnosis: Unsteadiness on feet (R26.81);Other abnormalities of gait and mobility (R26.89);Muscle weakness (generalized) (M62.81)  Time: 7253-6644 OT Time Calculation (min): 25 min Charges:  OT General Charges $OT Visit: 1 Visit OT Evaluation $OT Eval Moderate Complexity: 1 Mod OT Treatments $Self Care/Home Management : 8-22 mins  Rosey Bath OTR/L Acute Rehabilitation Services Office: [phone]   Rebeca Alert 08/01/2020, 3:06 PM

## 2020-08-01 NOTE — Progress Notes (Signed)
Physical Therapy Treatment Patient Details Name: Kevin Flores MRN: [id number] DOB: [birth year] Today's Date: 08/01/2020    History of Present Illness 79 y.o. male with history of coronary artery disease status post remote CABG and prior PCI procedures.  He underwent cardiac catheterization 07/27/2020 because of progressive LV systolic dysfunction.  He was found to have no targets for PCI with recommendation for ongoing medical therapy.  At 10:00 on 07/28/2020 he developed acute onset of substernal chest pain and after arrival in the emergency department his EKG evolved into an anterior STEMI. Pt underwent emergent cardiac cath on 11/18 with balloon angioplasty.    PT Comments    Pt making gradual progress.  He is motivated and demonstrated steady gait.  Min cues for safe transfer technique and RW use.  HR up to 110 bpm , O2 sats stable on 2 L O2 with DOE of 2/4.  Continue to progress as able.  Held further exercise due to reporting fatigue (had worked with OT and cardiac rehab earlier.)    Follow Up Recommendations  No PT follow up;Supervision - Intermittent     Equipment Recommendations  Other (comment) (rollator)    Recommendations for Other Services       Precautions / Restrictions Precautions Precautions: Fall Restrictions Weight Bearing Restrictions: No    Mobility  Bed Mobility               General bed mobility comments: pt sitting in recliner upon arrival  Transfers Overall transfer level: Needs assistance Equipment used: Rolling walker (2 wheeled) Transfers: Sit to/from Stand Sit to Stand: Min guard         General transfer comment: minguard for safety and vc for safe hand placement  Ambulation/Gait Ambulation/Gait assistance: Supervision Gait Distance (Feet): 180 Feet Assistive device: Rolling walker (2 wheeled) Gait Pattern/deviations: Step-through pattern Gait velocity: reduced   General Gait Details: Steady gait without LOB, min cues for RW use,  1 standing rest break and DOE of 2/4; VSS on 2 LPM O2 , HR up to 110 bpm   Stairs             Wheelchair Mobility    Modified Rankin (Stroke Patients Only)       Balance Overall balance assessment: Needs assistance Sitting-balance support: No upper extremity supported;Feet supported Sitting balance-Leahy Scale: Good     Standing balance support: No upper extremity supported;Bilateral upper extremity supported Standing balance-Leahy Scale: Fair Standing balance comment: Used RW for ambulation but lifted hands for static                            Cognition Arousal/Alertness: Awake/alert Behavior During Therapy: WFL for tasks assessed/performed Overall Cognitive Status: Within Functional Limits for tasks assessed                                 General Comments: Reports fatigue (worked with OT and walked with cardiac rehab earlier) but agreeable to PT      Exercises      General Comments General comments (skin integrity, edema, etc.): high run of PVCs noted on tele at end of session;Pt on 2lnc SpO2 >95% pt removed O2 for washing face while standing at sink level, desat to 89%, returned SpO2 >94% with 2lnc      Pertinent Vitals/Pain Pain Assessment: No/denies pain Pain Intervention(s): Monitored during session    Home Living Family/patient expects  to be discharged to:: Private residence Living Arrangements: Spouse/significant other Available Help at Discharge: Family;Available 24 hours/day Type of Home: House Home Access: Stairs to enter Entrance Stairs-Rails: None Home Layout: One level Home Equipment: Environmental consultant - 2 wheels;Cane - single point      Prior Function Level of Independence: Independent      Comments: pt likes to golf, works in golf shop 4 hours a week   PT Goals (current goals can now be found in the care plan section) Acute Rehab PT Goals Patient Stated Goal: To return to baseline PT Goal Formulation: With  patient Time For Goal Achievement: 09/02/2020 Potential to Achieve Goals: Good Progress towards PT goals: Progressing toward goals    Frequency           PT Plan Current plan remains appropriate    Co-evaluation              AM-PAC PT "6 Clicks" Mobility   Outcome Measure  Help needed turning from your back to your side while in a flat bed without using bedrails?: None Help needed moving from lying on your back to sitting on the side of a flat bed without using bedrails?: None Help needed moving to and from a bed to a chair (including a wheelchair)?: None Help needed standing up from a chair using your arms (e.g., wheelchair or bedside chair)?: None Help needed to walk in hospital room?: A Little Help needed climbing 3-5 steps with a railing? : A Little 6 Click Score: 22    End of Session Equipment Utilized During Treatment: Oxygen Activity Tolerance: Patient tolerated treatment well Patient left: with call bell/phone within reach;in chair;with family/visitor present Nurse Communication: Mobility status PT Visit Diagnosis: Other abnormalities of gait and mobility (R26.89)     Time: 0932-6712 PT Time Calculation (min) (ACUTE ONLY): 17 min  Charges:  $Gait Training: 8-22 mins                     Anise Salvo, PT Acute Rehab Services Pager [phone] [facility] [phone]     Rayetta Humphrey 08/01/2020, 3:14 PM

## 2020-08-01 NOTE — Progress Notes (Addendum)
Advanced Heart Failure Rounding Note   Subjective:    Started on milrinone 11/19 for low output HF/shock  Remains on milrinone 0.25 mcg. CO-OX 67.5 %.   Yesterday diuresed with IV lasix x 2. CVP down to 8-9.    Denies SOB. Denies chest pain. Walked 200 feet.    Objective:   Weight Range:  Vital Signs:   Temp:  [97.7 F (36.5 C)-100 F (37.8 C)] 98.5 F (36.9 C) (11/22 0300) Pulse Rate:  [89-103] 94 (11/22 0600) Resp:  [15-34] 25 (11/22 0600) BP: (86-127)/(41-98) 107/76 (11/22 0600) SpO2:  [88 %-99 %] 89 % (11/22 0600) FiO2 (%):  [28 %] 28 % (11/22 0300) Weight:  [84.6 kg] 84.6 kg (11/22 0500) Last BM Date: 07/31/20  Weight change: Filed Weights   07/30/20 0453 07/31/20 0619 08/01/20 0500  Weight: 82.1 kg 86.4 kg 84.6 kg    Intake/Output:   Intake/Output Summary (Last 24 hours) at 08/01/2020 0715 Last data filed at 08/01/2020 2703 Gross per 24 hour  Intake 1695.69 ml  Output 3525 ml  Net -1829.31 ml    CVP 8-9  Physical Exam: General:  No resp difficulty HEENT: normal Neck: supple. JVP 8-9. Carotids 2+ bilat; no bruits. No lymphadenopathy or thryomegaly appreciated. RIJ  Cor: PMI nondisplaced. Regular rate & rhythm. No rubs, gallops or murmurs. Lungs: clear Abdomen: soft, nontender, nondistended. No hepatosplenomegaly. No bruits or masses. Good bowel sounds. Extremities: no cyanosis, clubbing, rash, edema Neuro: alert & orientedx3, cranial nerves grossly intact. moves all 4 extremities w/o difficulty. Affect pleasant   Telemetry: SR 90s with occasional PVCs.  Labs: Basic Metabolic Panel: Recent Labs  Lab 07/29/20 2326 07/29/20 2326 07/30/20 0437 07/30/20 0437 07/31/20 0052 08/01/20 0117 08/01/20 0500  NA 137  --  138  --  135 137 137  K 3.5  --  4.2  --  3.9 3.7 3.8  CL 100  --  103  --  100 98 97*  CO2 27  --  26  --  23 27 27   GLUCOSE 141*  --  151*  --  119* 125* 117*  BUN 27*  --  27*  --  30* 32* 32*  CREATININE 2.04*  --  1.98*   --  1.98* 2.05* 2.09*  CALCIUM 9.1   < > 9.2   < > 8.8* 9.2 9.0   < > = values in this interval not displayed.    Liver Function Tests: Recent Labs  Lab 07/28/20 1429  AST 25  ALT 17  ALKPHOS 84  BILITOT 1.3*  PROT 7.3  ALBUMIN 3.5   No results for input(s): LIPASE, AMYLASE in the last 168 hours. No results for input(s): AMMONIA in the last 168 hours.  CBC: Recent Labs  Lab 07/28/20 1155 07/28/20 1155 07/29/20 0146 07/30/20 0437 07/31/20 0052 08/01/20 0117 08/01/20 0500  WBC 8.7   < > 14.0* 17.4* 17.8* 14.4* 13.7*  NEUTROABS 6.1  --   --   --   --   --   --   HGB 16.0   < > 16.7 14.6 13.4 13.9 13.4  HCT 50.0   < > 51.2 44.7 40.6 41.6 40.8  MCV 94.3   < > 93.3 93.1 90.6 91.2 92.9  PLT 171   < > 155 136* 127* 132* 129*   < > = values in this interval not displayed.    Cardiac Enzymes: No results for input(s): CKTOTAL, CKMB, CKMBINDEX, TROPONINI in the last 168 hours.  BNP: BNP (last 3 results) Recent Labs    06/09/20 1012 07/29/20 1643  BNP 774.8* 3,099.8*    ProBNP (last 3 results) No results for input(s): PROBNP in the last 8760 hours.    Other results:  Imaging: No results found.   Medications:     Scheduled Medications: . allopurinol  300 mg Oral Daily  . aspirin  81 mg Oral Daily  . atorvastatin  20 mg Oral Daily  . Chlorhexidine Gluconate Cloth  6 each Topical Daily  . fluticasone  1 spray Each Nare Daily  . heparin injection (subcutaneous)  5,000 Units Subcutaneous Q8H  . levothyroxine  75 mcg Oral Q0600  . loratadine  10 mg Oral QPM  . mometasone-formoterol  2 puff Inhalation BID  . montelukast  10 mg Oral QHS  . pantoprazole  40 mg Oral Daily  . polyethylene glycol  17 g Oral Daily  . senna-docusate  1 tablet Oral BID  . sodium chloride flush  10-40 mL Intracatheter Q12H  . sodium chloride flush  3 mL Intravenous Q12H  . torsemide  40 mg Oral Daily    Infusions: . sodium chloride 20 mL/hr at 07/28/20 1205  . sodium chloride     . milrinone 0.25 mcg/kg/min (08/01/20 0600)    PRN Medications: sodium chloride, acetaminophen, alum & mag hydroxide-simeth, morphine injection, ondansetron (ZOFRAN) IV, oxyCODONE, sodium chloride flush, sodium chloride flush   Assessment/Plan:   1. Acute on chronic systolic HF -> cardiogenic shock - echo 3/19 EF 50-55% - echo 10/21 EF < 20% RV moderately reduced - echo 11/21 EF < 10-15 % RV moderately reduced mild to moderate MR - now with post MI shock with increasing lactate and AKI - Remains on milrinone 0.25 mcg. CO-OX stable. Start  to wean milrinone.   - CVP 8-9.  Start torsemide today. Renal function stable.  - No ARB/ARNI with AKI.  - No b-blocker yet with shock  - Add Jardiance soon (don't want to bump creatinine until we are certain it has nadired). Hold off for now.  - in looking at cath films suspect initial decrease in EF from 3/19 -> 10/21 was nonischemic in nature (?PVCs) but no situation c/b large anterior infract with LIMA occlusion - will need to assess for candidacy of LVAD. Plavix is on hold. Continue inotropic support to maximize renal perfusion.  - Mobilize aggressively with PT/OT  2. 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 Continue ASA, statin - no -blocker with shock  3. AKI on CKD 3b - baseline creatinine 1.6-1.87. peaked at 2.09 - due to ATN/shock - stable 2.09 with milrinone. Check BMET in am.  - Continue inotropic support to maximize renal perfusion.   4. Leukocytosis - 17.4K -> 17.8->13.7   - no infectious symptoms - suspect due to MI - encouraged incentive spirometry  5. Thombocytopenia Platelets -trending donw [phone]  - Check CBC in am.   Continue to mobilize.      Length of Stay: 4   Amy Clegg NP-C  08/01/2020, 7:15 AM  Advanced Heart Failure Team Pager [phone] (M-F; 7a - 4p)  Please contact [facility] for night-coverage after hours (4p -7a ) and weekends on  [email or web address]  Patient seen and examined with the above-signed Advanced Practice Provider and/or Housestaff. I personally reviewed laboratory data, imaging studies and relevant notes. I independently examined the patient and formulated the important aspects of the plan. I have edited the note to reflect any of  my changes or salient points. I have personally discussed the plan with the patient and/or family.  Feeling a bit better today. Remains on milrinone 0.25. Co-ox 68%. CVP 8-9. Creatinine plateauing at 2.1  General:  Elderly No resp difficulty HEENT: normal Neck: supple. JVP 8-9 Carotids 2+ bilat; no bruits. No lymphadenopathy or thryomegaly appreciated. Cor: PMI nondisplaced. Regular rate & rhythm. No rubs, gallops or murmurs. Lungs: clear Abdomen: soft, nontender, nondistended. No hepatosplenomegaly. No bruits or masses. Good bowel sounds. Extremities: no cyanosis, clubbing, rash, edema Neuro: alert & orientedx3, cranial nerves grossly intact. moves all 4 extremities w/o difficulty. Affect pleasant  Remains tenuous but given age, fraility and CKD probably not VAD candidate. Wean milrinone slowly. Titrate GDMT as tolerated. Mobilize.   Arvilla Meres, MD  1:22 PM

## 2020-08-01 NOTE — Progress Notes (Signed)
Patient resting in bed near end of shift A&O x4. Patient able to get some rest throughout the night. Patient recently given pain medication PRN due to slight pain to left side. Patient U/O greater than 1L during shift, no BM noted.. Patient continues with Milrinone infusing continuous per [facility] for BP support. Patient continues with CVP monitoring. Plan is for possible LVAD placement. Safety measures remain in place and bed is locked in lowest position, call bell within reach. Will continue to monitor and SBARR to day shift nurse.

## 2020-08-01 NOTE — Progress Notes (Signed)
CARDIAC REHAB PHASE I   PRE:  Rate/Rhythm: 95 SR  BP:  Supine: 111/84  Sitting:   Standing:    SaO2: 94%2L  MODE:  Ambulation: 240 ft   POST:  Rate/Rhythm: 107 ST PVCs  BP:  Supine:   Sitting: 116/82  Standing:    SaO2: 95%2L [id number] Pt walked 240 ft on 2L with rolling walker and asst x 1 with fairly steady gait. Did not need to rest. To recliner with call bell. Pt stated he felt more comfortable in chair. Call bell in reach. Remains on 2L.  No complaints.   Luetta Nutting, RN BSN  08/01/2020 9:52 AM

## 2020-08-01 NOTE — Progress Notes (Signed)
CVP 3  

## 2020-08-02 DIAGNOSIS — I2102 ST elevation (STEMI) myocardial infarction involving left anterior descending coronary artery: Secondary | ICD-10-CM | POA: Diagnosis not present

## 2020-08-02 DIAGNOSIS — I5023 Acute on chronic systolic (congestive) heart failure: Secondary | ICD-10-CM | POA: Diagnosis not present

## 2020-08-02 DIAGNOSIS — N1832 Chronic kidney disease, stage 3b: Secondary | ICD-10-CM | POA: Diagnosis not present

## 2020-08-02 LAB — CBC
HCT: 42.8 % (ref 39.0–52.0)
Hemoglobin: 13.7 g/dL (ref 13.0–17.0)
MCH: 29.7 pg (ref 26.0–34.0)
MCHC: 32 g/dL (ref 30.0–36.0)
MCV: 92.6 fL (ref 80.0–100.0)
Platelets: 157 10*3/uL (ref 150–400)
RBC: 4.62 MIL/uL (ref 4.22–5.81)
RDW: 14.5 % (ref 11.5–15.5)
WBC: 11.6 10*3/uL — ABNORMAL HIGH (ref 4.0–10.5)
nRBC: 0 % (ref 0.0–0.2)

## 2020-08-02 LAB — BASIC METABOLIC PANEL
Anion gap: 15 (ref 5–15)
BUN: 39 mg/dL — ABNORMAL HIGH (ref 8–23)
CO2: 26 mmol/L (ref 22–32)
Calcium: 9.3 mg/dL (ref 8.9–10.3)
Chloride: 95 mmol/L — ABNORMAL LOW (ref 98–111)
Creatinine, Ser: 2.09 mg/dL — ABNORMAL HIGH (ref 0.61–1.24)
GFR, Estimated: 32 mL/min — ABNORMAL LOW (ref >60)
Glucose, Bld: 112 mg/dL — ABNORMAL HIGH (ref 70–99)
Potassium: 4.2 mmol/L (ref 3.5–5.1)
Sodium: 136 mmol/L (ref 135–145)

## 2020-08-02 LAB — COOXEMETRY PANEL
Carboxyhemoglobin: 1.4 % (ref 0.5–1.5)
Methemoglobin: 0.9 % (ref 0.0–1.5)
O2 Saturation: 51.6 %
Total hemoglobin: 13.8 g/dL (ref 12.0–16.0)

## 2020-08-02 LAB — MAGNESIUM: Magnesium: 2.2 mg/dL (ref 1.7–2.4)

## 2020-08-02 MED ORDER — FUROSEMIDE 10 MG/ML IJ SOLN
80.0000 mg | Freq: Once | INTRAMUSCULAR | Status: AC
  Administered 2020-08-02: 80 mg via INTRAVENOUS
  Filled 2020-08-02: qty 8

## 2020-08-02 MED ORDER — CLOPIDOGREL BISULFATE 75 MG PO TABS
75.0000 mg | ORAL_TABLET | Freq: Every day | ORAL | Status: DC
  Administered 2020-08-03 – 2020-08-09 (×7): 75 mg via ORAL
  Filled 2020-08-02 (×8): qty 1

## 2020-08-02 MED ORDER — TORSEMIDE 20 MG PO TABS
40.0000 mg | ORAL_TABLET | Freq: Two times a day (BID) | ORAL | Status: DC
  Administered 2020-08-02: 40 mg via ORAL
  Filled 2020-08-02: qty 2

## 2020-08-02 NOTE — Care Management Important Message (Signed)
Important Message  Patient Details  Name: Kevin Flores MRN: [id number] Date of Birth: January 07, 1941   Medicare Important Message Given:  Yes     Dorena Bodo 08/02/2020, 12:28 PM

## 2020-08-02 NOTE — Progress Notes (Addendum)
Advanced Heart Failure Rounding Note   Subjective:    Started on milrinone 11/19 for low output HF/shock  Yesterday milrinone was cut back to 0.125 mcg. CO-OX 52%.   Having some clear sputum today. Denies SOB at rest but SOB with exertion.    Objective:   Weight Range:  Vital Signs:   Temp:  [97.3 F (36.3 C)-98.6 F (37 C)] 97.8 F (36.6 C) (11/23 0807) Pulse Rate:  [93-101] 101 (11/23 0327) Resp:  [15-20] 18 (11/23 0327) BP: (96-124)/(65-97) 101/76 (11/23 0327) SpO2:  [94 %-98 %] 96 % (11/23 0327) FiO2 (%):  [28 %] 28 % (11/22 1912) Weight:  [81.5 kg] 81.5 kg (11/23 0327) Last BM Date: 07/31/20  Weight change: Filed Weights   07/31/20 0619 08/01/20 0500 08/02/20 0327  Weight: 86.4 kg 84.6 kg 81.5 kg    Intake/Output:   Intake/Output Summary (Last 24 hours) at 08/02/2020 0912 Last data filed at 08/02/2020 6433 Gross per 24 hour  Intake 534.17 ml  Output 1225 ml  Net -690.83 ml    CVP 12. General: Sitting in the chair.  No resp difficulty HEENT: normal Neck: supple. JVP 11-12  Carotids 2+ bilat; no bruits. No lymphadenopathy or thryomegaly appreciated. RIJ  Cor: PMI nondisplaced. Regular rate & rhythm. No rubs, gallops or murmurs. Lungs: clear Abdomen: soft, nontender, nondistended. No hepatosplenomegaly. No bruits or masses. Good bowel sounds. Extremities: no cyanosis, clubbing, rash, edema Neuro: alert & orientedx3, cranial nerves grossly intact. moves all 4 extremities w/o difficulty. Affect pleasant   Telemetry: SR 90-100s  Labs: Basic Metabolic Panel: Recent Labs  Lab 07/30/20 0437 07/30/20 0437 07/31/20 0052 07/31/20 0052 08/01/20 0117 08/01/20 0500 08/02/20 0430  NA 138  --  135  --  137 137 136  K 4.2  --  3.9  --  3.7 3.8 4.2  CL 103  --  100  --  98 97* 95*  CO2 26  --  23  --  27 27 26   GLUCOSE 151*  --  119*  --  125* 117* 112*  BUN 27*  --  30*  --  32* 32* 39*  CREATININE 1.98*  --  1.98*  --  2.05* 2.09* 2.09*  CALCIUM 9.2    < > 8.8*   < > 9.2 9.0 9.3  MG  --   --   --   --   --   --  2.2   < > = values in this interval not displayed.    Liver Function Tests: Recent Labs  Lab 07/28/20 1429  AST 25  ALT 17  ALKPHOS 84  BILITOT 1.3*  PROT 7.3  ALBUMIN 3.5   No results for input(s): LIPASE, AMYLASE in the last 168 hours. No results for input(s): AMMONIA in the last 168 hours.  CBC: Recent Labs  Lab 07/28/20 1155 07/29/20 0146 07/30/20 0437 07/31/20 0052 08/01/20 0117 08/01/20 0500 08/02/20 0430  WBC 8.7   < > 17.4* 17.8* 14.4* 13.7* 11.6*  NEUTROABS 6.1  --   --   --   --   --   --   HGB 16.0   < > 14.6 13.4 13.9 13.4 13.7  HCT 50.0   < > 44.7 40.6 41.6 40.8 42.8  MCV 94.3   < > 93.1 90.6 91.2 92.9 92.6  PLT 171   < > 136* 127* 132* 129* 157   < > = values in this interval not displayed.    Cardiac Enzymes: No  results for input(s): CKTOTAL, CKMB, CKMBINDEX, TROPONINI in the last 168 hours.  BNP: BNP (last 3 results) Recent Labs    06/09/20 1012 07/29/20 1643  BNP 774.8* 3,099.8*    ProBNP (last 3 results) No results for input(s): PROBNP in the last 8760 hours.    Other results:  Imaging: No results found.   Medications:     Scheduled Medications: . allopurinol  300 mg Oral Daily  . aspirin  81 mg Oral Daily  . atorvastatin  20 mg Oral Daily  . Chlorhexidine Gluconate Cloth  6 each Topical Daily  . fluticasone  1 spray Each Nare Daily  . heparin injection (subcutaneous)  5,000 Units Subcutaneous Q8H  . levothyroxine  75 mcg Oral Q0600  . loratadine  10 mg Oral QPM  . mometasone-formoterol  2 puff Inhalation BID  . montelukast  10 mg Oral QHS  . pantoprazole  40 mg Oral Daily  . polyethylene glycol  17 g Oral Daily  . sodium chloride flush  10-40 mL Intracatheter Q12H  . sodium chloride flush  3 mL Intravenous Q12H  . torsemide  40 mg Oral Daily    Infusions: . sodium chloride 20 mL/hr at 07/28/20 1205  . sodium chloride    . milrinone 0.125 mcg/kg/min  (08/02/20 0400)    PRN Medications: sodium chloride, acetaminophen, alum & mag hydroxide-simeth, morphine injection, ondansetron (ZOFRAN) IV, oxyCODONE, sodium chloride flush, sodium chloride flush   Assessment/Plan:   1. Acute on chronic systolic HF -> cardiogenic shock - echo 3/19 EF 50-55% - echo 10/21 EF < 20% RV moderately reduced - echo 11/21 EF < 10-15 % RV moderately reduced mild to moderate MR - now with post MI shock with increasing lactate and AKI - Yesterday milrinone was cut back to 0.125 mcg. CO-OX 52%. Watch another day. Repeat CO-OX in am.  -   CVP up to 12-13. Increase torsemide to 40 mg twice a day.  - No ARB/ARNI with AKI.  - No b-blocker yet with shock  - Add Jardiance soon (don't want to bump creatinine until we are certain it has nadired). Hold off for now.  - in looking at cath films suspect initial decrease in EF from 3/19 -> 10/21 was nonischemic in nature (?PVCs) but no situation c/b large anterior infract with LIMA occlusion - will need to assess for candidacy of LVAD. Plavix is on hold. Continue inotropic support to maximize renal perfusion.  - Mobilize aggressively with PT/OT  2. CAD with acute anterior infarct due to thrombotic LIMA occlusion - No chest pain.  - off heparin. Continue ASA, statin - no -blocker with shock  3. AKI on CKD 3b - baseline creatinine 1.6-1.87. peaked at 2.09 - due to ATN/shock - Unchanged at 2.09 with milrinone.  - Continue inotropic support to maximize renal perfusion.   4. Leukocytosis - 17.4K -> 17.8->13.7  >11.6  - no infectious symptoms - suspect due to MI - encouraged incentive spirometry  5. Thombocytopenia Platelets up today. 333>545>625 >157 today    Ambulate today. Check sats.    Length of Stay: 5   Amy Clegg NP-C  08/02/2020, 9:12 AM  Advanced Heart Failure Team Pager [phone] (M-F; 7a - 4p)  Please contact [facility] for night-coverage after hours (4p -7a ) and weekends on  [email or web address]  Patient seen and examined with the above-signed Advanced Practice Provider and/or Housestaff. I personally reviewed laboratory data, imaging studies and relevant notes. I independently examined the patient and formulated the important  aspects of the plan. I have edited the note to reflect any of my changes or salient points. I have personally discussed the plan with the patient and/or family.  Milrinone cut back yesterday. Co-ox down. SOB with minimal exertion. JVP still up. Creatinine stable at 2.1  General:  Elderly weak appearing. No resp difficulty HEENT: normal Neck: supple. JVP to jaw RIJ TLC Carotids 2+ bilat; no bruits. No lymphadenopathy or thryomegaly appreciated. Cor: PMI nondisplaced. Regular rate & rhythm. No rubs, gallops or murmurs. Lungs: clear Abdomen: soft, nontender, nondistended. No hepatosplenomegaly. No bruits or masses. Good bowel sounds. Extremities: no cyanosis, clubbing, rash, edema Neuro: alert & orientedx3, cranial nerves grossly intact. moves all 4 extremities w/o difficulty. Affect pleasant  He remains very tenuous. Co-ox low. Will keep milrinone at current level and follow. Increase torsemide to 40 bid. Hold off on SGLT2i. We discussed possible VAD again but he is likely not a candidate due to advanced age, fraility and CKD. He also says that he is not overly interested in mechanical support.   Arvilla Meres, MD  2:06 PM

## 2020-08-02 NOTE — Progress Notes (Signed)
  Complaining of increased cough this afternoon. Suspect he is picking up volume again. Earlier torsemide was increased to 40 mg twice a day.   CVP 11-12 today. Remains on milrinone 0.125 mcg. CO-OX lower today.    Stop Torsemide and give 80 mg IV lasix now. If cough persists would get CXR in am.   Check CO-OX in am.  Kimberlea Schlag NP-C  4:02 PM

## 2020-08-02 NOTE — Progress Notes (Signed)
Physical Therapy Treatment Patient Details Name: Kevin Flores MRN: [id number] DOB: 08-31-1941 Today's Date: 08/02/2020    History of Present Illness 79 y.o. male with history of coronary artery disease status post remote CABG and prior PCI procedures.  He underwent cardiac catheterization 07/27/2020 because of progressive LV systolic dysfunction.  He was found to have no targets for PCI with recommendation for ongoing medical therapy.  At 10:00 on 07/28/2020 he developed acute onset of substernal chest pain and after arrival in the emergency department his EKG evolved into an anterior STEMI. Pt underwent emergent cardiac cath on 11/18 with balloon angioplasty.    PT Comments    Pt sitting up in chair on entry with wife in room. Pt agreeable to walking with therapy. Pt has dry non-productive cough that is very irritating to him. Pt on 2L O2 via  [place] on enter with SaO2 98%O2, removed [place] and pt able to maintain SaO2>90%O2 throughout ambulation even with coughing. Pt is min guard for transfers and supervision for ambulation with RW. Pt is making good progress towards his goals and d/c plans remain appropriate.      Follow Up Recommendations  No PT follow up;Supervision - Intermittent     Equipment Recommendations  Other (comment) (rollator)       Precautions / Restrictions Precautions Precautions: Fall Restrictions Weight Bearing Restrictions: No    Mobility  Bed Mobility               General bed mobility comments: pt sitting in recliner upon arrival  Transfers Overall transfer level: Needs assistance Equipment used: Rolling walker (2 wheeled) Transfers: Sit to/from Stand Sit to Stand: Min guard         General transfer comment: minguard for safety and vc for safe hand placement  Ambulation/Gait Ambulation/Gait assistance: Supervision Gait Distance (Feet): 180 Feet Assistive device: Rolling walker (2 wheeled) Gait Pattern/deviations: Step-through pattern Gait  velocity: reduced Gait velocity interpretation: <1.8 ft/sec, indicate of risk for recurrent falls General Gait Details: supervision for safety, slow, steady gait       Balance Overall balance assessment: Needs assistance Sitting-balance support: No upper extremity supported;Feet supported Sitting balance-Leahy Scale: Good     Standing balance support: No upper extremity supported;Bilateral upper extremity supported Standing balance-Leahy Scale: Fair Standing balance comment: Used RW for ambulation but lifted hands for static                            Cognition Arousal/Alertness: Awake/alert Behavior During Therapy: WFL for tasks assessed/performed Overall Cognitive Status: Within Functional Limits for tasks assessed                                 General Comments: reports he has been up a couple of times but states he knows he needs to work with therapy too         General Comments General comments (skin integrity, edema, etc.): HR 102-118bpm with ambulation, Pt on 2L O2 on entry with SaO2 98%O2, removed supplemental O2 and pt able to maintain SaO2 >90%O2 throughout ambulation       Pertinent Vitals/Pain Pain Assessment: Faces Faces Pain Scale: Hurts little more Pain Location: chest pain with coughing Pain Descriptors / Indicators: Sore Pain Intervention(s): Limited activity within patient's tolerance;Monitored during session;Repositioned           PT Goals (current goals can now be found in the care  plan section) Acute Rehab PT Goals Patient Stated Goal: To return to baseline PT Goal Formulation: With patient Time For Goal Achievement: 09-01-2020 Potential to Achieve Goals: Good Progress towards PT goals: Progressing toward goals    Frequency    Min 3X/week      PT Plan Current plan remains appropriate       AM-PAC PT "6 Clicks" Mobility   Outcome Measure  Help needed turning from your back to your side while in a flat bed  without using bedrails?: None Help needed moving from lying on your back to sitting on the side of a flat bed without using bedrails?: None Help needed moving to and from a bed to a chair (including a wheelchair)?: None Help needed standing up from a chair using your arms (e.g., wheelchair or bedside chair)?: None Help needed to walk in hospital room?: A Little Help needed climbing 3-5 steps with a railing? : A Little 6 Click Score: 22    End of Session Equipment Utilized During Treatment: Gait belt Activity Tolerance: Patient tolerated treatment well Patient left: with call bell/phone within reach;in chair;with family/visitor present Nurse Communication: Mobility status PT Visit Diagnosis: Other abnormalities of gait and mobility (R26.89)     Time: 2542-7062 PT Time Calculation (min) (ACUTE ONLY): 27 min  Charges:  $Therapeutic Exercise: 23-37 mins                     Trana Ressler B. Beverely Risen PT, DPT Acute Rehabilitation Services Pager [phone] Office [phone]    Elon Alas Fleet 08/02/2020, 3:41 PM

## 2020-08-02 NOTE — Progress Notes (Signed)
CARDIAC REHAB PHASE I   PRE:  Rate/Rhythm: 103 ST  BP:  Supine:   Sitting: 114/86, 121/92  Standing:    SaO2: 96% 2L  MODE:  Ambulation: 240 ft   POST:  Rate/Rhythm: 108 ST PVCs  BP:  Supine:   Sitting: 125/92  Standing:    SaO2: 96%2L 0905-1000 Assisted pt to [facility] and then helped him get cleaned up after BM. Pt walked 240 ft on 2L with rolling walker and asst x1 for equipment. Stopped once to rest. Sats good on 2L. Back to recliner after walk. Would recommend walker for home use. Call bell in reach. No complaints but tires easily.   Luetta Nutting, RN BSN  08/02/2020 9:54 AM

## 2020-08-02 NOTE — Progress Notes (Signed)
With bouts of  Non productive cough  since this morning . NP made aware, seen pt. CVP_10. Sat on room air-86 % placed back on 2L [place] - pulse ox-94 %. Lasix 80 mg iv given- output- 500 ml post lasix. Cough subsided. Continue to monitor.

## 2020-08-03 ENCOUNTER — Inpatient Hospital Stay (HOSPITAL_COMMUNITY): Payer: Medicare Other

## 2020-08-03 DIAGNOSIS — N1832 Chronic kidney disease, stage 3b: Secondary | ICD-10-CM | POA: Diagnosis not present

## 2020-08-03 DIAGNOSIS — I2102 ST elevation (STEMI) myocardial infarction involving left anterior descending coronary artery: Secondary | ICD-10-CM | POA: Diagnosis not present

## 2020-08-03 DIAGNOSIS — I5023 Acute on chronic systolic (congestive) heart failure: Secondary | ICD-10-CM | POA: Diagnosis not present

## 2020-08-03 LAB — CBC
HCT: 43 % (ref 39.0–52.0)
Hemoglobin: 14 g/dL (ref 13.0–17.0)
MCH: 30.1 pg (ref 26.0–34.0)
MCHC: 32.6 g/dL (ref 30.0–36.0)
MCV: 92.5 fL (ref 80.0–100.0)
Platelets: 172 10*3/uL (ref 150–400)
RBC: 4.65 MIL/uL (ref 4.22–5.81)
RDW: 14.4 % (ref 11.5–15.5)
WBC: 9.4 10*3/uL (ref 4.0–10.5)
nRBC: 0 % (ref 0.0–0.2)

## 2020-08-03 LAB — COOXEMETRY PANEL
Carboxyhemoglobin: 1.3 % (ref 0.5–1.5)
Methemoglobin: 0.8 % (ref 0.0–1.5)
O2 Saturation: 64.7 %
Total hemoglobin: 14.3 g/dL (ref 12.0–16.0)

## 2020-08-03 LAB — BASIC METABOLIC PANEL
Anion gap: 11 (ref 5–15)
BUN: 43 mg/dL — ABNORMAL HIGH (ref 8–23)
CO2: 30 mmol/L (ref 22–32)
Calcium: 9.3 mg/dL (ref 8.9–10.3)
Chloride: 96 mmol/L — ABNORMAL LOW (ref 98–111)
Creatinine, Ser: 2.05 mg/dL — ABNORMAL HIGH (ref 0.61–1.24)
GFR, Estimated: 33 mL/min — ABNORMAL LOW (ref >60)
Glucose, Bld: 155 mg/dL — ABNORMAL HIGH (ref 70–99)
Potassium: 3.6 mmol/L (ref 3.5–5.1)
Sodium: 137 mmol/L (ref 135–145)

## 2020-08-03 LAB — MAGNESIUM: Magnesium: 2.4 mg/dL (ref 1.7–2.4)

## 2020-08-03 MED ORDER — MELATONIN 3 MG PO TABS
3.0000 mg | ORAL_TABLET | Freq: Every evening | ORAL | Status: DC | PRN
  Administered 2020-08-03 – 2020-08-09 (×5): 3 mg via ORAL
  Filled 2020-08-03 (×5): qty 1

## 2020-08-03 MED ORDER — IPRATROPIUM-ALBUTEROL 0.5-2.5 (3) MG/3ML IN SOLN
3.0000 mL | Freq: Once | RESPIRATORY_TRACT | Status: AC
  Administered 2020-08-03: 3 mL via RESPIRATORY_TRACT
  Filled 2020-08-03: qty 3

## 2020-08-03 MED ORDER — POTASSIUM CHLORIDE CRYS ER 20 MEQ PO TBCR
40.0000 meq | EXTENDED_RELEASE_TABLET | Freq: Once | ORAL | Status: AC
  Administered 2020-08-03: 40 meq via ORAL
  Filled 2020-08-03: qty 2

## 2020-08-03 MED ORDER — GUAIFENESIN 200 MG PO TABS
200.0000 mg | ORAL_TABLET | ORAL | Status: DC | PRN
  Filled 2020-08-03: qty 1

## 2020-08-03 MED ORDER — TORSEMIDE 20 MG PO TABS
40.0000 mg | ORAL_TABLET | Freq: Once | ORAL | Status: AC
  Administered 2020-08-03: 40 mg via ORAL

## 2020-08-03 MED ORDER — TORSEMIDE 20 MG PO TABS
40.0000 mg | ORAL_TABLET | Freq: Two times a day (BID) | ORAL | Status: DC
  Administered 2020-08-03 – 2020-08-05 (×4): 40 mg via ORAL
  Filled 2020-08-03 (×5): qty 2

## 2020-08-03 MED ORDER — DOXYCYCLINE HYCLATE 100 MG PO TABS
100.0000 mg | ORAL_TABLET | Freq: Two times a day (BID) | ORAL | Status: AC
  Administered 2020-08-03 – 2020-08-07 (×10): 100 mg via ORAL
  Filled 2020-08-03 (×10): qty 1

## 2020-08-03 MED ORDER — GUAIFENESIN ER 600 MG PO TB12
600.0000 mg | ORAL_TABLET | Freq: Two times a day (BID) | ORAL | Status: DC
  Administered 2020-08-03 – 2020-08-11 (×17): 600 mg via ORAL
  Filled 2020-08-03 (×17): qty 1

## 2020-08-03 NOTE — Progress Notes (Signed)
1000 Came to see pt to walk. Pt stated he just took long walk and got bath. Tired now. Will return as time permits. Luetta Nutting RN BSN 08/03/2020 9:57 AM

## 2020-08-03 NOTE — Progress Notes (Signed)
CARDIAC REHAB PHASE I   PRE:  Rate/Rhythm: 101 ST  BP:  Supine:   Sitting: 119/93  Standing:    SaO2: 96% 2L    91%RA  MODE:  Ambulation: 240 ft   POST:  Rate/Rhythm: 112 ST  BP:  Supine:   Sitting: 116/95  Standing:    SaO2: 90-91%RA whole walk 1320-1403 Pt walked 240 ft on RA with rolling walker and asst x 1. Had mask on but pulled off nose so he could breathe more easily, since I needed to see if he qualified for oxygen. Pt never dipped below 90% on RA. Pt tired by end of walk. To recliner with call bell. Pt stated his nose is very sore and hurting so did not put oxygen back on. Left tubing within pt's reach and left on sat monitor. Notified RN. Had pt use IS to demonstrate how to use correctly. Pt at 750 ml. Using IS makes him cough more frequently. Using [place] for sputum.    Luetta Nutting, RN BSN  08/03/2020 1:57 PM

## 2020-08-03 NOTE — TOC Progression Note (Signed)
Transition of Care [facility]) - Progression Note    Patient Details  Name: Kevin Flores MRN: [id number] Date of Birth: November 05, 1940  Transition of Care [facility]) CM/SW Contact  Leone Haven, RN Phone Number: 08/03/2020, 10:00 PM  Clinical Narrative:    Patient will need home milrinone, NCM made referral to [facility].  Will need to check with Pam.        Expected Discharge Plan and Services                                                 Social Determinants of Health (SDOH) Interventions    Readmission Risk Interventions No flowsheet data found.

## 2020-08-03 NOTE — Progress Notes (Addendum)
Advanced Heart Failure Rounding Note   Subjective:    Started on milrinone 11/19 for low output HF/shock  Yesterday:  CO-OX 52% CVP 11-12, on milrinone 0.125, increase torsemide 40 BID.  In afternoon, reporting dry non-productive cough with CVP 11.  Gave lasix 80 mg IV.    Today: Co-ox 65%, CVP 6, on milrinone 0.125.  Reported cough had improved after IV lasix but now continuing to cough which is productive.  Had almost 3L UOP yesterday.  Denies chest pain and reports shortness of breath only with cough.    Objective:   Weight Range:  Vital Signs:   Temp:  [98 F (36.7 C)-98.7 F (37.1 C)] 98.7 F (37.1 C) (11/24 0800) Pulse Rate:  [81-100] 99 (11/24 0800) Resp:  [15-23] 18 (11/24 0800) BP: (97-128)/(72-95) 117/80 (11/24 0800) SpO2:  [90 %-98 %] 90 % (11/24 0800) FiO2 (%):  [28 %-32 %] 28 % (11/24 0753) Weight:  [80.1 kg] 80.1 kg (11/24 0506) Last BM Date: 08/02/20  Weight change: Filed Weights   08/01/20 0500 08/02/20 0327 08/03/20 0506  Weight: 84.6 kg 81.5 kg 80.1 kg    Intake/Output:   Intake/Output Summary (Last 24 hours) at 08/03/2020 0844 Last data filed at 08/03/2020 0500 Gross per 24 hour  Intake 310.67 ml  Output 2951 ml  Net -2640.33 ml    CVP 6 General:  Well appearing. No resp difficulty HEENT: normal Neck: supple. + JVD. Carotids 2+ bilat; no bruits.  Cor: PMI nondisplaced. Regular rate & rhythm. No rubs, gallops or murmurs. Lungs: clear bilaterally.  Abdomen: soft, nontender, nondistended. No hepatosplenomegaly. No bruits or masses. Active bowel sounds.   Extremities: no cyanosis, clubbing, rash, edema Neuro: alert & oriented x 3, cranial nerves grossly intact. moves all 4 extremities w/o difficulty. Affect pleasant    Telemetry: SR 90-100s  Labs: Basic Metabolic Panel: Recent Labs  Lab 07/31/20 0052 07/31/20 0052 08/01/20 0117 08/01/20 0117 08/01/20 0500 08/02/20 0430 08/03/20 0436  NA 135  --  137  --  137 136 137  K 3.9  --   3.7  --  3.8 4.2 3.6  CL 100  --  98  --  97* 95* 96*  CO2 23  --  27  --  27 26 30   GLUCOSE 119*  --  125*  --  117* 112* 155*  BUN 30*  --  32*  --  32* 39* 43*  CREATININE 1.98*  --  2.05*  --  2.09* 2.09* 2.05*  CALCIUM 8.8*   < > 9.2   < > 9.0 9.3 9.3  MG  --   --   --   --   --  2.2  --    < > = values in this interval not displayed.    Liver Function Tests: Recent Labs  Lab 07/28/20 1429  AST 25  ALT 17  ALKPHOS 84  BILITOT 1.3*  PROT 7.3  ALBUMIN 3.5   No results for input(s): LIPASE, AMYLASE in the last 168 hours. No results for input(s): AMMONIA in the last 168 hours.  CBC: Recent Labs  Lab 07/28/20 1155 07/29/20 0146 07/31/20 0052 08/01/20 0117 08/01/20 0500 08/02/20 0430 08/03/20 0436  WBC 8.7   < > 17.8* 14.4* 13.7* 11.6* 9.4  NEUTROABS 6.1  --   --   --   --   --   --   HGB 16.0   < > 13.4 13.9 13.4 13.7 14.0  HCT 50.0   < >  40.6 41.6 40.8 42.8 43.0  MCV 94.3   < > 90.6 91.2 92.9 92.6 92.5  PLT 171   < > 127* 132* 129* 157 172   < > = values in this interval not displayed.    Cardiac Enzymes: No results for input(s): CKTOTAL, CKMB, CKMBINDEX, TROPONINI in the last 168 hours.  BNP: BNP (last 3 results) Recent Labs    06/09/20 1012 07/29/20 1643  BNP 774.8* 3,099.8*    ProBNP (last 3 results) No results for input(s): PROBNP in the last 8760 hours.    Other results:  Imaging: No results found.   Medications:     Scheduled Medications: . allopurinol  300 mg Oral Daily  . aspirin  81 mg Oral Daily  . atorvastatin  20 mg Oral Daily  . Chlorhexidine Gluconate Cloth  6 each Topical Daily  . clopidogrel  75 mg Oral Daily  . fluticasone  1 spray Each Nare Daily  . heparin injection (subcutaneous)  5,000 Units Subcutaneous Q8H  . levothyroxine  75 mcg Oral Q0600  . loratadine  10 mg Oral QPM  . mometasone-formoterol  2 puff Inhalation BID  . montelukast  10 mg Oral QHS  . pantoprazole  40 mg Oral Daily  . polyethylene glycol  17 g  Oral Daily  . potassium chloride  40 mEq Oral Once  . sodium chloride flush  10-40 mL Intracatheter Q12H  . sodium chloride flush  3 mL Intravenous Q12H    Infusions: . sodium chloride 20 mL/hr at 07/28/20 1205  . sodium chloride    . milrinone 0.125 mcg/kg/min (08/03/20 0500)    PRN Medications: sodium chloride, acetaminophen, alum & mag hydroxide-simeth, melatonin, morphine injection, ondansetron (ZOFRAN) IV, oxyCODONE, sodium chloride flush, sodium chloride flush   Assessment/Plan:   1. Acute on chronic systolic HF -> cardiogenic shock - echo 3/19 EF 50-55% - echo 10/21 EF < 20% RV moderately reduced - echo 11/21 EF < 10-15 % RV moderately reduced mild to moderate MR - now with post MI shock with increasing lactate and AKI - Yesterday required IV lasix in afternoon after PO torsemide 40 with co-ox 52%, CVP 11-12.  - Today: CVP  6, co-ox 65%; has been experiencing increase CVP on days PO diuretic given.  Will trial higher dose of torsemide 40 mg BID, monitor closely.   - No ARB/ARNI with AKI.  - No b-blocker yet with shock  - Add Jardiance soon (don't want to bump creatinine until we are certain it has nadired). Hold off for now.  - Suspect initial decrease in EF from 3/19 -> 10/21 was nonischemic in nature (?PVCs) but no situation c/b large anterior infract with LIMA occlusion from cath films.  - Patient does not appear interested in mechanical support, may need to consider inotropic support at home.  - Continue inotropic support to maximize renal perfusion.  - Mobilize aggressively with PT/OT  2. CAD with acute anterior infarct due to thrombotic LIMA occlusion - No chest pain.  - off heparin. Continue ASA, statin - no -blocker with shock  3. AKI on CKD 3b - baseline creatinine 1.6-1.87. peaked at 2.09 - due to ATN/shock - Unchanged at 2.05 with milrinone.  - Continue inotropic support to maximize renal perfusion.   4. Leukocytosis - 17.4K -> 17.8->13.7  >11.6 >  9.4 - suspect due to MI - encouraged incentive spirometry  5. Thombocytopenia Platelets up today. 568>127>517 >157 > 172  6. Cough -hx of persistent cough outpt, had PFTs -  CT chest 11/4 showed interstitial pulmonary edema w/ small b/l pleural effusion; interstitial lung disease could not be evaluated; mild paraseptal emphysema -Yesterday had dry cough that had improved with IV lasix but now productive today -CXR showed no signs of edema, effusion or infiltrates -In setting of productive cough, now requiring oxygen with clear CXR will treat as bronchitis with doxycycline x 5 days.    Length of Stay: 6   Terance Ice NP-C  08/03/2020, 8:44 AM  Advanced Heart Failure Team Pager [phone] (M-F; 7a - 4p)  Please contact [facility] for night-coverage after hours (4p -7a ) and weekends on [email or web address]  Patient seen and examined with the above-signed Advanced Practice Provider and/or Housestaff. I personally reviewed laboratory data, imaging studies and relevant notes. I independently examined the patient and formulated the important aspects of the plan. I have edited the note to reflect any of my changes or salient points. I have personally discussed the plan with the patient and/or family.  Remains on milrinone 0.125. Co-ox improved. Volume status looks ok.  General:  Elderly. No resp difficulty HEENT: normal Neck: supple. no JVD. Carotids 2+ bilat; no bruits. No lymphadenopathy or thryomegaly appreciated. Cor: PMI nondisplaced. Regular rate & rhythm. No rubs, gallops or murmurs. Lungs: clear Abdomen: soft, nontender, nondistended. No hepatosplenomegaly. No bruits or masses. Good bowel sounds. Extremities: no cyanosis, clubbing, rash, edema Neuro: alert & orientedx3, cranial nerves grossly intact. moves all 4 extremities w/o difficulty. Affect pleasant   Will stop milrinone. Follow co-ox closely. Be careful not to overdiurese. Not candidate for LVAD at this point.   Arvilla Meres, MD  2:12 PM

## 2020-08-03 NOTE — Plan of Care (Signed)
  Problem: Education: Goal: Understanding of CV disease, CV risk reduction, and recovery process will improve Outcome: Progressing Goal: Individualized Educational Video(s) Outcome: Progressing   Problem: Activity: Goal: Ability to return to baseline activity level will improve Outcome: Progressing   Problem: Education: Goal: Understanding of CV disease, CV risk reduction, and recovery process will improve Outcome: Progressing Goal: Individualized Educational Video(s) Outcome: Progressing   Problem: Cardiovascular: Goal: Ability to achieve and maintain adequate cardiovascular perfusion will improve Outcome: Progressing Goal: Vascular access site(s) Level 0-1 will be maintained Outcome: Progressing   Problem: Health Behavior/Discharge Planning: Goal: Ability to safely manage health-related needs after discharge will improve Outcome: Progressing   Problem: Education: Goal: Knowledge of General Education information will improve Description: Including pain rating scale, medication(s)/side effects and non-pharmacologic comfort measures Outcome: Progressing   Problem: Health Behavior/Discharge Planning: Goal: Ability to manage health-related needs will improve Outcome: Progressing   Problem: Clinical Measurements: Goal: Ability to maintain clinical measurements within normal limits will improve Outcome: Progressing Goal: Will remain free from infection Outcome: Progressing Goal: Diagnostic test results will improve Outcome: Progressing Goal: Respiratory complications will improve Outcome: Progressing Goal: Cardiovascular complication will be avoided Outcome: Progressing   Problem: Activity: Goal: Risk for activity intolerance will decrease Outcome: Progressing   Problem: Nutrition: Goal: Adequate nutrition will be maintained Outcome: Progressing   Problem: Coping: Goal: Level of anxiety will decrease Outcome: Progressing   Problem: Coping: Goal: Level of anxiety  will decrease Outcome: Progressing   Problem: Elimination: Goal: Will not experience complications related to bowel motility Outcome: Progressing Goal: Will not experience complications related to urinary retention Outcome: Progressing   Problem: Pain Managment: Goal: General experience of comfort will improve Outcome: Progressing   Problem: Safety: Goal: Ability to remain free from injury will improve Outcome: Progressing   Problem: Skin Integrity: Goal: Risk for impaired skin integrity will decrease Outcome: Progressing

## 2020-08-03 NOTE — Progress Notes (Signed)
Occupational Therapy Treatment Patient Details Name: Kevin Flores MRN: [id number] DOB: February 20, 1941 Today's Date: 08/03/2020    History of present illness 79 y.o. male with history of coronary artery disease status post remote CABG and prior PCI procedures.  He underwent cardiac catheterization 07/27/2020 because of progressive LV systolic dysfunction.  He was found to have no targets for PCI with recommendation for ongoing medical therapy.  At 10:00 on 07/28/2020 he developed acute onset of substernal chest pain and after arrival in the emergency department his EKG evolved into an anterior STEMI. Pt underwent emergent cardiac cath on 11/18 with balloon angioplasty.   OT comments  Pt readily willing to get OOB. Sp02 noted to drop to 85% with ambulation on RA with mask donned, without mask he remained between 88-92%, replace 2L with Sp02 of 92%. Pt with cough, but reports not as bad as yesterday. Verbally educated in energy conservation strategies. Pt remained up in chair at end of session. Will continue to follow.  Follow Up Recommendations  Supervision - Intermittent;No OT follow up    Equipment Recommendations  3 in 1 bedside commode    Recommendations for Other Services      Precautions / Restrictions Precautions Precautions: Fall Precaution Comments: watch 02       Mobility Bed Mobility Overal bed mobility: Modified Independent                Transfers Overall transfer level: Needs assistance Equipment used: Rolling walker (2 wheeled) Transfers: Sit to/from Stand Sit to Stand: Min guard         General transfer comment: cues for hand placement    Balance Overall balance assessment: Needs assistance   Sitting balance-Leahy Scale: Good       Standing balance-Leahy Scale: Fair                             ADL either performed or assessed with clinical judgement   ADL Overall ADL's : Needs assistance/impaired     Grooming: Min  guard;Standing;Wash/dry hands;Wash/dry face;Oral care           Upper Body Dressing : Set up;Sitting       Toilet Transfer: Min guard;Ambulation;RW   Toileting- Architect and Hygiene: Min guard;Sit to/from stand       Functional mobility during ADLs: Min guard;Rolling walker General ADL Comments: verbally educated in energy conservation strategies, recommended shower seat, pt in agreement     Vision       Perception     Praxis      Cognition Arousal/Alertness: Awake/alert Behavior During Therapy: WFL for tasks assessed/performed Overall Cognitive Status: Within Functional Limits for tasks assessed                                          Exercises     Shoulder Instructions       General Comments      Pertinent Vitals/ Pain       Pain Assessment: No/denies pain  Home Living                                          Prior Functioning/Environment              Frequency  Min  2X/week        Progress Toward Goals  OT Goals(current goals can now be found in the care plan section)  Progress towards OT goals: Progressing toward goals  Acute Rehab OT Goals Patient Stated Goal: To return to baseline OT Goal Formulation: With patient Time For Goal Achievement: 08/15/20 Potential to Achieve Goals: Good  Plan Discharge plan remains appropriate    Co-evaluation                 AM-PAC OT "6 Clicks" Daily Activity     Outcome Measure   Help from another person eating meals?: None Help from another person taking care of personal grooming?: A Little Help from another person toileting, which includes using toliet, bedpan, or urinal?: A Little Help from another person bathing (including washing, rinsing, drying)?: A Little Help from another person to put on and taking off regular upper body clothing?: None Help from another person to put on and taking off regular lower body clothing?: A Little 6  Click Score: 20    End of Session Equipment Utilized During Treatment: Rolling walker;Oxygen;Gait belt  OT Visit Diagnosis: Unsteadiness on feet (R26.81);Other abnormalities of gait and mobility (R26.89);Muscle weakness (generalized) (M62.81)   Activity Tolerance Patient tolerated treatment well   Patient Left in chair;with call bell/phone within reach   Nurse Communication          Time: 7893-8101 OT Time Calculation (min): 23 min  Charges: OT General Charges $OT Visit: 1 Visit OT Treatments $Self Care/Home Management : 23-37 mins  Martie Round, OTR/L Acute Rehabilitation Services Pager: [phone] Office: [phone]   Evern Bio 08/03/2020, 9:29 AM

## 2020-08-04 DIAGNOSIS — I5023 Acute on chronic systolic (congestive) heart failure: Secondary | ICD-10-CM | POA: Diagnosis not present

## 2020-08-04 DIAGNOSIS — N1832 Chronic kidney disease, stage 3b: Secondary | ICD-10-CM | POA: Diagnosis not present

## 2020-08-04 DIAGNOSIS — I2102 ST elevation (STEMI) myocardial infarction involving left anterior descending coronary artery: Secondary | ICD-10-CM | POA: Diagnosis not present

## 2020-08-04 LAB — CBC
HCT: 45.4 % (ref 39.0–52.0)
Hemoglobin: 14.6 g/dL (ref 13.0–17.0)
MCH: 29.4 pg (ref 26.0–34.0)
MCHC: 32.2 g/dL (ref 30.0–36.0)
MCV: 91.3 fL (ref 80.0–100.0)
Platelets: 188 10*3/uL (ref 150–400)
RBC: 4.97 MIL/uL (ref 4.22–5.81)
RDW: 14.6 % (ref 11.5–15.5)
WBC: 10.6 10*3/uL — ABNORMAL HIGH (ref 4.0–10.5)
nRBC: 0 % (ref 0.0–0.2)

## 2020-08-04 LAB — BASIC METABOLIC PANEL
Anion gap: 14 (ref 5–15)
BUN: 51 mg/dL — ABNORMAL HIGH (ref 8–23)
CO2: 27 mmol/L (ref 22–32)
Calcium: 9.5 mg/dL (ref 8.9–10.3)
Chloride: 96 mmol/L — ABNORMAL LOW (ref 98–111)
Creatinine, Ser: 2.34 mg/dL — ABNORMAL HIGH (ref 0.61–1.24)
GFR, Estimated: 28 mL/min — ABNORMAL LOW (ref >60)
Glucose, Bld: 137 mg/dL — ABNORMAL HIGH (ref 70–99)
Potassium: 4.3 mmol/L (ref 3.5–5.1)
Sodium: 137 mmol/L (ref 135–145)

## 2020-08-04 LAB — COOXEMETRY PANEL
Carboxyhemoglobin: 1.1 % (ref 0.5–1.5)
Methemoglobin: 0.7 % (ref 0.0–1.5)
O2 Saturation: 47.5 %
Total hemoglobin: 15 g/dL (ref 12.0–16.0)

## 2020-08-04 MED ORDER — TRAZODONE HCL 50 MG PO TABS
100.0000 mg | ORAL_TABLET | Freq: Every evening | ORAL | Status: DC | PRN
  Administered 2020-08-04 – 2020-08-10 (×3): 100 mg via ORAL
  Filled 2020-08-04 (×3): qty 2

## 2020-08-04 MED ORDER — SORBITOL 70 % SOLN: 30.0000 mL | Freq: Once | Status: DC

## 2020-08-04 MED ORDER — FUROSEMIDE 10 MG/ML IJ SOLN
80.0000 mg | Freq: Once | INTRAMUSCULAR | Status: AC
  Administered 2020-08-04: 80 mg via INTRAVENOUS
  Filled 2020-08-04: qty 8

## 2020-08-04 NOTE — Progress Notes (Signed)
Advanced Heart Failure Rounding Note   Subjective:    Started on milrinone 11/19 for low output HF/shock  Milrinone stopped yesterday. Co-ox 65% -> 48%. Feels ok. Not sleeping well. Mildly SOB. No orthopnea or PND. CVP 13.   Creatinine 2.0 -> 2.3  Objective:   Weight Range:  Vital Signs:   Temp:  [97.5 F (36.4 C)-98 F (36.7 C)] 97.5 F (36.4 C) (11/25 0854) Pulse Rate:  [93-105] 93 (11/25 0854) Resp:  [18-24] 18 (11/25 0854) BP: (112-115)/(88-90) 112/88 (11/25 0021) SpO2:  [90 %-98 %] 97 % (11/25 0854) FiO2 (%):  [95 %] 95 % (11/24 1929) Last BM Date: 08/03/20  Weight change: Filed Weights   08/01/20 0500 08/02/20 0327 08/03/20 0506  Weight: 84.6 kg 81.5 kg 80.1 kg    Intake/Output:   Intake/Output Summary (Last 24 hours) at 08/04/2020 1014 Last data filed at 08/04/2020 0800 Gross per 24 hour  Intake 386.78 ml  Output 1800 ml  Net -1413.22 ml     General:  Well appearing. No resp difficulty HEENT: normal Neck: supple. JVP to jaw Carotids 2+ bilat; no bruits. No lymphadenopathy or thryomegaly appreciated. Cor: PMI nondisplaced. Mildly irregular rate & rhythm. No rubs, gallops or murmurs. Lungs: clear Abdomen: soft, nontender, nondistended. No hepatosplenomegaly. No bruits or masses. Good bowel sounds. Extremities: no cyanosis, clubbing, rash, edema Neuro: alert & orientedx3, cranial nerves grossly intact. moves all 4 extremities w/o difficulty. Affect pleasant  Telemetry: SR 90-110 + PVCs Personally reviewed  Labs: Basic Metabolic Panel: Recent Labs  Lab 08/01/20 0117 08/01/20 0117 08/01/20 0500 08/01/20 0500 08/02/20 0430 08/03/20 0436 08/03/20 0809 08/04/20 0351  NA 137  --  137  --  136 137  --  137  K 3.7  --  3.8  --  4.2 3.6  --  4.3  CL 98  --  97*  --  95* 96*  --  96*  CO2 27  --  27  --  26 30  --  27  GLUCOSE 125*  --  117*  --  112* 155*  --  137*  BUN 32*  --  32*  --  39* 43*  --  51*  CREATININE 2.05*  --  2.09*  --  2.09*  2.05*  --  2.34*  CALCIUM 9.2   < > 9.0   < > 9.3 9.3  --  9.5  MG  --   --   --   --  2.2  --  2.4  --    < > = values in this interval not displayed.    Liver Function Tests: Recent Labs  Lab 07/28/20 1429  AST 25  ALT 17  ALKPHOS 84  BILITOT 1.3*  PROT 7.3  ALBUMIN 3.5   No results for input(s): LIPASE, AMYLASE in the last 168 hours. No results for input(s): AMMONIA in the last 168 hours.  CBC: Recent Labs  Lab 07/28/20 1155 07/29/20 0146 08/01/20 0117 08/01/20 0500 08/02/20 0430 08/03/20 0436 08/04/20 0351  WBC 8.7   < > 14.4* 13.7* 11.6* 9.4 10.6*  NEUTROABS 6.1  --   --   --   --   --   --   HGB 16.0   < > 13.9 13.4 13.7 14.0 14.6  HCT 50.0   < > 41.6 40.8 42.8 43.0 45.4  MCV 94.3   < > 91.2 92.9 92.6 92.5 91.3  PLT 171   < > 132* 129* 157 172 188   < > =  values in this interval not displayed.    Cardiac Enzymes: No results for input(s): CKTOTAL, CKMB, CKMBINDEX, TROPONINI in the last 168 hours.  BNP: BNP (last 3 results) Recent Labs    06/09/20 1012 07/29/20 1643  BNP 774.8* 3,099.8*    ProBNP (last 3 results) No results for input(s): PROBNP in the last 8760 hours.    Other results:  Imaging: DG Chest 2 View  Result Date: 08/03/2020 CLINICAL DATA:  Continued shortness of breath and cough EXAM: CHEST - 2 VIEW COMPARISON:  07/29/2020 FINDINGS: Cardiac shadow remains enlarged. Postsurgical changes are again seen. Aortic calcifications are noted. Right jugular central line is again seen and stable. Lungs are well aerated bilaterally. No focal infiltrate or sizable effusion is seen. Mild fibrotic changes are again seen and stable IMPRESSION: No acute abnormality noted. Electronically Signed   By: Alcide Clever M.D.   On: 08/03/2020 12:33     Medications:     Scheduled Medications: . allopurinol  300 mg Oral Daily  . aspirin  81 mg Oral Daily  . atorvastatin  20 mg Oral Daily  . Chlorhexidine Gluconate Cloth  6 each Topical Daily  .  clopidogrel  75 mg Oral Daily  . doxycycline  100 mg Oral Q12H  . fluticasone  1 spray Each Nare Daily  . guaiFENesin  600 mg Oral BID  . heparin injection (subcutaneous)  5,000 Units Subcutaneous Q8H  . levothyroxine  75 mcg Oral Q0600  . loratadine  10 mg Oral QPM  . mometasone-formoterol  2 puff Inhalation BID  . montelukast  10 mg Oral QHS  . pantoprazole  40 mg Oral Daily  . polyethylene glycol  17 g Oral Daily  . sodium chloride flush  10-40 mL Intracatheter Q12H  . sodium chloride flush  3 mL Intravenous Q12H  . torsemide  40 mg Oral BID    Infusions: . sodium chloride 20 mL/hr at 07/28/20 1205  . sodium chloride      PRN Medications: sodium chloride, acetaminophen, alum & mag hydroxide-simeth, melatonin, morphine injection, ondansetron (ZOFRAN) IV, oxyCODONE, sodium chloride flush, sodium chloride flush   Assessment/Plan:   1. Acute on chronic systolic HF -> cardiogenic shock - echo 3/19 EF 50-55% - echo 10/21 EF < 20% RV moderately reduced - echo 11/21 EF < 10-15 % RV moderately reduced mild to moderate MR - now with post MI shock with increasing lactate and AKI - Milrinone stopped yesterday. Co-ox down, CVP up. Creatinine worse. I worry he may be inotrope dependent.  - No ARB/ARNI with AKI.  - No b-blocker yet with shock  - Suspect initial decrease in EF from 3/19 -> 10/21 was nonischemic in nature (?PVCs) but no situation c/b large anterior infract with LIMA occlusion from cath films.  - Will hold off on restarting milrinone for today. Try low-lose hydralazine for afterload reduction. Lasix 80 IV x 1 today - He is not VAD candidate.  - May need to consider palliative home inotropes.   2. CAD with acute anterior infarct due to thrombotic LIMA occlusion - No chest pain.  - off heparin. Continue DAPT, statin - no -blocker with shock  3. AKI on CKD 3b - baseline creatinine 1.6-1.87. - due to ATN/shock - Up from 2.05 -> 2.34 off milrinone. Watch closely - Plan  as above  5. Thombocytopenia -resolved  6. Cough -hx of persistent cough outpt, had PFTs - CT chest 11/4 showed interstitial pulmonary edema w/ small b/l pleural effusion; interstitial lung disease  could not be evaluated; mild paraseptal emphysema - on doxy for possible bronchitis  7. Insomnia - add trazodone  Length of Stay: 7   Vernice Mannina MD 08/04/2020, 10:14 AM  Advanced Heart Failure Team Pager [phone] (M-F; 7a - 4p)  Please contact [facility] for night-coverage after hours (4p -7a ) and weekends on [email or web address]

## 2020-08-04 NOTE — Plan of Care (Signed)

## 2020-08-05 ENCOUNTER — Inpatient Hospital Stay (HOSPITAL_COMMUNITY): Payer: Medicare Other

## 2020-08-05 DIAGNOSIS — R609 Edema, unspecified: Secondary | ICD-10-CM

## 2020-08-05 DIAGNOSIS — I2102 ST elevation (STEMI) myocardial infarction involving left anterior descending coronary artery: Secondary | ICD-10-CM | POA: Diagnosis not present

## 2020-08-05 DIAGNOSIS — N1832 Chronic kidney disease, stage 3b: Secondary | ICD-10-CM | POA: Diagnosis not present

## 2020-08-05 DIAGNOSIS — I5023 Acute on chronic systolic (congestive) heart failure: Secondary | ICD-10-CM | POA: Diagnosis not present

## 2020-08-05 HISTORY — PX: IR IVC FILTER PLMT / S&I /IMG GUID/MOD SED: IMG701

## 2020-08-05 LAB — BASIC METABOLIC PANEL
Anion gap: 21 — ABNORMAL HIGH (ref 5–15)
BUN: 76 mg/dL — ABNORMAL HIGH (ref 8–23)
CO2: 22 mmol/L (ref 22–32)
Calcium: 9.8 mg/dL (ref 8.9–10.3)
Chloride: 95 mmol/L — ABNORMAL LOW (ref 98–111)
Creatinine, Ser: 3.2 mg/dL — ABNORMAL HIGH (ref 0.61–1.24)
GFR, Estimated: 19 mL/min — ABNORMAL LOW (ref >60)
Glucose, Bld: 140 mg/dL — ABNORMAL HIGH (ref 70–99)
Potassium: 4.1 mmol/L (ref 3.5–5.1)
Sodium: 138 mmol/L (ref 135–145)

## 2020-08-05 LAB — CBC
HCT: 47.1 % (ref 39.0–52.0)
Hemoglobin: 15.3 g/dL (ref 13.0–17.0)
MCH: 29.9 pg (ref 26.0–34.0)
MCHC: 32.5 g/dL (ref 30.0–36.0)
MCV: 92.2 fL (ref 80.0–100.0)
Platelets: 169 10*3/uL (ref 150–400)
RBC: 5.11 MIL/uL (ref 4.22–5.81)
RDW: 14.6 % (ref 11.5–15.5)
WBC: 13.3 10*3/uL — ABNORMAL HIGH (ref 4.0–10.5)
nRBC: 0 % (ref 0.0–0.2)

## 2020-08-05 LAB — HEPARIN LEVEL (UNFRACTIONATED): Heparin Unfractionated: 0.17 IU/mL — ABNORMAL LOW (ref 0.30–0.70)

## 2020-08-05 LAB — COOXEMETRY PANEL
Carboxyhemoglobin: 1 % (ref 0.5–1.5)
Methemoglobin: 0.7 % (ref 0.0–1.5)
O2 Saturation: 42.6 %
Total hemoglobin: 15.6 g/dL (ref 12.0–16.0)

## 2020-08-05 MED ORDER — LIDOCAINE-EPINEPHRINE 1 %-1:100000 IJ SOLN
INTRAMUSCULAR | Status: AC
  Filled 2020-08-05: qty 1

## 2020-08-05 MED ORDER — SODIUM CHLORIDE 0.9 % IV BOLUS
250.0000 mL | Freq: Once | INTRAVENOUS | Status: AC
  Administered 2020-08-05: 250 mL via INTRAVENOUS

## 2020-08-05 MED ORDER — MIDAZOLAM HCL 2 MG/2ML IJ SOLN
INTRAMUSCULAR | Status: AC | PRN
  Administered 2020-08-05: 1 mg via INTRAVENOUS

## 2020-08-05 MED ORDER — LIDOCAINE-EPINEPHRINE 1 %-1:100000 IJ SOLN
INTRAMUSCULAR | Status: AC | PRN
  Administered 2020-08-05: 10 mL

## 2020-08-05 MED ORDER — HEPARIN BOLUS VIA INFUSION
4000.0000 [IU] | Freq: Once | INTRAVENOUS | Status: AC
  Administered 2020-08-05: 4000 [IU] via INTRAVENOUS
  Filled 2020-08-05: qty 4000

## 2020-08-05 MED ORDER — FENTANYL CITRATE (PF) 100 MCG/2ML IJ SOLN
INTRAMUSCULAR | Status: AC | PRN
Start: 2020-08-05 — End: 2020-08-05
  Administered 2020-08-05: 25 ug via INTRAVENOUS

## 2020-08-05 MED ORDER — MILRINONE LACTATE IN DEXTROSE 20-5 MG/100ML-% IV SOLN
0.2500 ug/kg/min | INTRAVENOUS | Status: DC
  Administered 2020-08-05 – 2020-08-09 (×8): 0.25 ug/kg/min via INTRAVENOUS
  Filled 2020-08-05 (×8): qty 100

## 2020-08-05 MED ORDER — FENTANYL CITRATE (PF) 100 MCG/2ML IJ SOLN
INTRAMUSCULAR | Status: AC
  Filled 2020-08-05: qty 2

## 2020-08-05 MED ORDER — HEPARIN (PORCINE) 25000 UT/250ML-% IV SOLN
1900.0000 [IU]/h | INTRAVENOUS | Status: DC
  Administered 2020-08-05: 1400 [IU]/h via INTRAVENOUS
  Administered 2020-08-06: 1900 [IU]/h via INTRAVENOUS
  Administered 2020-08-06: 1600 [IU]/h via INTRAVENOUS
  Filled 2020-08-05 (×3): qty 250

## 2020-08-05 MED ORDER — MIDAZOLAM HCL 2 MG/2ML IJ SOLN
INTRAMUSCULAR | Status: AC
  Filled 2020-08-05: qty 2

## 2020-08-05 NOTE — Progress Notes (Signed)
PT Cancellation Note  Patient Details Name: Jamir Rone MRN: [id number] DOB: 1940-10-25   Cancelled Treatment:    Reason Eval/Treat Not Completed: Medical issues which prohibited therapy  Pt found to have extensive R LE DVT and to go for IVC filter placement soon.  Will hold therapy today. Anise Salvo, PT Acute Rehab Services Pager [phone] [facility] [phone]   Rayetta Humphrey 08/05/2020, 12:06 PM

## 2020-08-05 NOTE — Progress Notes (Addendum)
  RLE venous doppler shows extensive DVT throughout entire R leg.   Heparin started.  D/w IR with plan IVC filter, If becomes more symptomatic can consider clot extraction.   I discussed this with Kevin Flores personally and he wants to proceed.   Arvilla Meres, MD  11:50 AM

## 2020-08-05 NOTE — Procedures (Signed)
Interventional Radiology Procedure Note  Procedure: IVC filter placement  Findings: Please refer to procedural dictation for full description. Infrarenal [place] IVC filter placed, IJ access.  Complications: None immediate  Estimated Blood Loss: <5 mL  Recommendations: Continue anticoagulation. The filter is potentially retrievable once DVT has resolved and anticoagulation is tolerated or no longer needed.   IR will continue to follow in the short term, as well as set up outpatient follow up in 2-3 months for potential IVC filter retrieval.    Marliss Coots, MD Pager: [phone]

## 2020-08-05 NOTE — Progress Notes (Signed)
Chief Complaint: Patient was seen in consultation today for IVC filter   Referring Physician(s): Dr. Gala Romney  Supervising Physician: Dr. Elby Showers  Patient Status: [facility] - In-pt  History of Present Illness: Kevin Flores is a 79 y.o. male admitted with acute on chronic CHF and cardiogenic shock. He also now has acute (R)LE DVT. Has been placed on IV heparin, but given his cardiac status, it is felt he would not tolerate PE well. IR is asked to place IVC filter. Also to consider more aggressive thrombectomy/thrombolysis if he does not improve with conservative mgmt. PMHx, meds, labs, imaging, allergies reviewed. Family at bedside.   Past Medical History:  Diagnosis Date  . Allergy    seasonal  . Arthritis   . CAD (coronary artery disease)    1998 LIMA to the LAD, SVG to circumflex. DES placed to the PDA 11/2009   . GERD with stricture   . Gout   . Heartburn   . Hyperlipidemia   . Hypertension   . Nephrolithiasis    left  . Personal history of colonic polyps    adenomas  . Thyroid disease     Past Surgical History:  Procedure Laterality Date  . BIOPSY PROSTATE    . cardiac stents  2001   and [id number]  . COLONOSCOPY W/ POLYPECTOMY  2009   3 small adenomas  . CORONARY ARTERY BYPASS GRAFT  1998   2 bypass  . CORONARY/GRAFT ACUTE MI REVASCULARIZATION N/A 07/28/2020   Procedure: CORONARY/GRAFT ACUTE MI REVASCULARIZATION;  Surgeon: Tonny Bollman, MD;  Location: [facility] INVASIVE CV LAB;  Service: Cardiovascular;  Laterality: N/A;  . cysto with calculus manipulation    . RIGHT/LEFT HEART CATH AND CORONARY/GRAFT ANGIOGRAPHY N/A 07/27/2020   Procedure: RIGHT/LEFT HEART CATH AND CORONARY/GRAFT ANGIOGRAPHY;  Surgeon: Corky Crafts, MD;  Location: [facility] INVASIVE CV LAB;  Service: Cardiovascular;  Laterality: N/A;  . UPPER GASTROINTESTINAL ENDOSCOPY      Allergies: Imdur [isosorbide dinitrate], Rosuvastatin, and Sulfonamide derivatives  Medications:  Current  Facility-Administered Medications:  .  0.9 %  sodium chloride infusion, , Intravenous, Continuous, Bensimhon, Bevelyn Buckles, MD, Last Rate: 20 mL/hr at 07/28/20 1205, New Bag/Given (Non-Interop) at 07/28/20 1205 .  0.9 %  sodium chloride infusion, 250 mL, Intravenous, PRN, Bensimhon, Bevelyn Buckles, MD .  acetaminophen (TYLENOL) tablet 650 mg, 650 mg, Oral, Q4H PRN, Bensimhon, Bevelyn Buckles, MD, 650 mg at 07/29/20 0227 .  allopurinol (ZYLOPRIM) tablet 300 mg, 300 mg, Oral, Daily, Bensimhon, Bevelyn Buckles, MD, 300 mg at 08/05/20 0952 .  alum & mag hydroxide-simeth (MAALOX/MYLANTA) 200-200-20 MG/5ML suspension 30 mL, 30 mL, Oral, Q4H PRN, Bensimhon, Bevelyn Buckles, MD, 30 mL at 07/29/20 1254 .  aspirin chewable tablet 81 mg, 81 mg, Oral, Daily, Bensimhon, Bevelyn Buckles, MD, 81 mg at 08/05/20 0952 .  atorvastatin (LIPITOR) tablet 20 mg, 20 mg, Oral, Daily, Bensimhon, Bevelyn Buckles, MD, 20 mg at 08/04/20 1659 .  Chlorhexidine Gluconate Cloth 2 % PADS 6 each, 6 each, Topical, Daily, Bensimhon, Bevelyn Buckles, MD, 6 each at 08/04/20 [phone]  clopidogrel (PLAVIX) tablet 75 mg, 75 mg, Oral, Daily, Bensimhon, Bevelyn Buckles, MD, 75 mg at 08/05/20 0951 .  doxycycline (VIBRA-TABS) tablet 100 mg, 100 mg, Oral, Q12H, Liggett, Lyman Speller, NP, 100 mg at 08/05/20 0952 .  fluticasone (FLONASE) 50 MCG/ACT nasal spray 1 spray, 1 spray, Each Nare, Daily, Bensimhon, Bevelyn Buckles, MD, 1 spray at 08/05/20 0953 .  guaiFENesin (MUCINEX) 12 hr tablet 600 mg, 600 mg, Oral, BID, Liggett,  Lyman Speller, NP, 600 mg at 08/05/20 0952 .  heparin ADULT infusion 100 units/mL (25000 units/25mL sodium chloride 0.45%), 1,400 Units/hr, Intravenous, Continuous, Tonny Bollman, MD, Last Rate: 14 mL/hr at 08/05/20 1131, 1,400 Units/hr at 08/05/20 1131 .  levothyroxine (SYNTHROID) tablet 75 mcg, 75 mcg, Oral, Q0600, Bensimhon, Bevelyn Buckles, MD, 75 mcg at 08/05/20 0608 .  loratadine (CLARITIN) tablet 10 mg, 10 mg, Oral, QPM, Bensimhon, Bevelyn Buckles, MD, 10 mg at 08/04/20 1700 .  melatonin tablet 3 mg, 3 mg,  Oral, QHS PRN, Terance Ice, NP, 3 mg at 08/04/20 2140 .  milrinone (PRIMACOR) 20 MG/100 ML (0.2 mg/mL) infusion, 0.25 mcg/kg/min, Intravenous, Continuous, Bensimhon, Bevelyn Buckles, MD, Last Rate: 6.2 mL/hr at 08/05/20 0849, 0.25 mcg/kg/min at 08/05/20 0849 .  mometasone-formoterol (DULERA) 200-5 MCG/ACT inhaler 2 puff, 2 puff, Inhalation, BID, Bensimhon, Bevelyn Buckles, MD, 2 puff at 08/05/20 0834 .  montelukast (SINGULAIR) tablet 10 mg, 10 mg, Oral, QHS, Bensimhon, Bevelyn Buckles, MD, 10 mg at 08/04/20 2141 .  morphine 2 MG/ML injection 2 mg, 2 mg, Intravenous, Q1H PRN, Bensimhon, Bevelyn Buckles, MD, 2 mg at 07/29/20 1241 .  ondansetron (ZOFRAN) injection 4 mg, 4 mg, Intravenous, Q6H PRN, Bensimhon, Bevelyn Buckles, MD .  oxyCODONE (Oxy IR/ROXICODONE) immediate release tablet 5-10 mg, 5-10 mg, Oral, Q4H PRN, Bensimhon, Bevelyn Buckles, MD, 10 mg at 08/05/20 0331 .  pantoprazole (PROTONIX) EC tablet 40 mg, 40 mg, Oral, Daily, Bensimhon, Bevelyn Buckles, MD, 40 mg at 08/05/20 0952 .  polyethylene glycol (MIRALAX / GLYCOLAX) packet 17 g, 17 g, Oral, Daily, Bensimhon, Bevelyn Buckles, MD, 17 g at 08/05/20 0952 .  sodium chloride flush (NS) 0.9 % injection 10-40 mL, 10-40 mL, Intracatheter, Q12H, Bensimhon, Bevelyn Buckles, MD, 10 mL at 08/05/20 0953 .  sodium chloride flush (NS) 0.9 % injection 3 mL, 3 mL, Intravenous, Q12H, Bensimhon, Bevelyn Buckles, MD, 3 mL at 08/05/20 0954 .  traZODone (DESYREL) tablet 100 mg, 100 mg, Oral, QHS PRN, Bensimhon, Bevelyn Buckles, MD, 100 mg at 08/04/20 2140    Family History  Problem Relation Age of Onset  . Colon cancer Brother        33s  . Diabetes Brother   . Heart disease Brother   . Diabetes Father   . Breast cancer Sister   . Esophageal cancer Neg Hx   . Liver cancer Neg Hx   . Pancreatic cancer Neg Hx   . Prostate cancer Neg Hx   . Rectal cancer Neg Hx   . Stomach cancer Neg Hx     Social History   Socioeconomic History  . Marital status: Married    Spouse name: [name]  . Number of children: 4  . Years of  education: 13  . Highest education level: High school graduate  Occupational History  . Occupation: retired  Tobacco Use  . Smoking status: Former Smoker    Packs/day: 1.00    Years: 12.00    Pack years: 12.00    Types: Cigarettes    Quit date: 12/19/1971    Years since quitting: 48.6  . Smokeless tobacco: Never Used  Vaping Use  . Vaping Use: Never used  Substance and Sexual Activity  . Alcohol use: No  . Drug use: No  . Sexual activity: Not Currently  Other Topics Concern  . 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 Difficulty of Paying Living Expenses: Somewhat hard  Food Insecurity: No Food Insecurity  . Worried About Programme researcher, broadcasting/film/video in the Last Year: Never true  . Ran Out of Food in the Last Year: Never true  Transportation Needs: No Transportation Needs  . Lack of Transportation (Medical): No  . Lack of Transportation (Non-Medical): No  Physical Activity: Sufficiently Active  . Days of Exercise per Week: 3 days  . Minutes of Exercise per Session: 60 min  Stress: No Stress Concern Present  . Feeling of Stress : Only a little  Social Connections: Socially Integrated  . Frequency of Communication with Friends and Family: More than three times a week  . Frequency of Social Gatherings with Friends and Family: More than three times a week  . Attends Religious Services: More than 4 times per year  . Active Member of Clubs or Organizations: Yes  . Attends Banker Meetings: More than 4 times per year  . Marital Status: Married     Review of Systems: A 12 point ROS discussed and pertinent  positives are indicated in the HPI above.  All other systems are negative.  Review of Systems  Vital Signs: BP 108/88 (BP Location: Left Arm)   Pulse 91   Temp 97.6 F (36.4 C) (Oral)   Resp 20   Ht 5\' 9"  (1.753 m)   Wt 82.6 kg   SpO2 97%   BMI 26.89 kg/m   Physical Exam Constitutional:      Appearance: Normal appearance.  HENT:     Mouth/Throat:     Mouth: Mucous membranes are moist.     Pharynx: Oropharynx is clear.  Cardiovascular:     Rate and Rhythm: Normal rate and regular rhythm.     Heart sounds: Normal heart sounds.  Pulmonary:     Effort: Pulmonary effort is normal. No respiratory distress.     Breath sounds: Normal breath sounds.  Musculoskeletal:        General: Swelling present.     Comments: (R)LE edema from foot to upper thigh. Soft but tight. No phlegmasia noted Foot warm.  Neurological:     General: No focal deficit present.     Mental Status: He is alert and oriented to person, place, and time.  Psychiatric:        Mood and Affect: Mood normal.        Thought Content: Thought content normal.      Imaging: DG Chest 2 View  Result Date: 08/03/2020 CLINICAL DATA:  Continued shortness of breath and cough EXAM: CHEST - 2 VIEW COMPARISON:  07/29/2020 FINDINGS: Cardiac shadow remains enlarged. Postsurgical changes are again seen. Aortic calcifications are noted. Right jugular central line is again seen and stable. Lungs are well aerated bilaterally. No focal infiltrate or sizable effusion is seen. Mild fibrotic changes are again seen and stable IMPRESSION: No acute abnormality noted. Electronically Signed   By: Alcide Clever M.D.   On: 08/03/2020 12:33   CARDIAC CATHETERIZATION  Result Date: 07/28/2020  Balloon angioplasty was performed.  Post intervention, there is a 100% residual stenosis.  Interval occlusion of the LIMA to LAD graft, suspect atheroembolic event.  Unsuccessful reperfusion  using aspiration thrombectomy and balloon angioplasty  throughout the course of the LAD and left internal mammary graft. Recommendations: Medical therapy, start clopidogrel, check 2D echo, resume heparin and continue for 24 to 48 hours.  Discussed findings and recommendations for medical therapy with the patient and his family.  I do not think he has any revascularization options as this appears to be a distal embolic event.   CARDIAC CATHETERIZATION  Result Date: 07/27/2020  1st Diag lesion is 99% stenosed. This is a small vessel.  Ost LM to Mid LM lesion is 75% stenosed. Mid LAD lesion is 100% stenosed. Prox LAD lesion is 90% stenosed. LIMA to LAD is patent.  Prox Cx to Mid Cx lesion is 100% stenosed. 2nd Mrg-1 lesion is 80% stenosed. 2nd Mrg-2 lesion is 100% stenosed. SVG to OM is patent but the native vessel is now occluded at the insertion. There is retrograde flow into the circumflex, but no antegrade flow to the distal OM.  Ost Cx to Prox Cx lesion is 90% stenosed with 80% stenosed side branch in 1st Mrg.  Previously placed RPDA stent is widely patent.  LV end diastolic pressure is mildly elevated.  There is no aortic valve stenosis.  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  Ischemia noted in 2019 is likely due to this disease. No target for PCI.  Medical therapy for LV dysfunction.  Follow mitral regurgitation and consider mitraclip eval if heart failure progresses.   CT Chest High Resolution  Result Date: 07/15/2020 CLINICAL DATA:  79 year old male with history of chronic dyspnea. Central airways disease suspected. Chronic cough, worsening over the past 2 months. EXAM: CT CHEST WITHOUT CONTRAST TECHNIQUE: Multidetector CT imaging of the chest was performed following the standard protocol without intravenous contrast. High resolution imaging of the lungs, as well as inspiratory and expiratory imaging, was  performed. COMPARISON:  No priors. FINDINGS: Cardiovascular: Heart size is enlarged. There is no significant pericardial fluid, thickening or pericardial calcification. There is aortic atherosclerosis, as well as atherosclerosis of the great vessels of the mediastinum and the coronary arteries, including calcified atherosclerotic plaque in the left main, left anterior descending, left circumflex and right coronary arteries. Status post median sternotomy for CABG including LIMA to the LAD. Mediastinum/Nodes: Multiple prominent borderline enlarged mediastinal and bilateral hilar lymph nodes are noted, nonspecific. Esophagus is unremarkable in appearance. No axillary lymphadenopathy. Lungs/Pleura: High-resolution images demonstrates some mild diffuse ground-glass attenuation and interlobular septal thickening. No traction bronchiectasis or honeycombing. Inspiratory and expiratory imaging is unremarkable. No definite suspicious appearing pulmonary nodules or masses are noted. There is also mild paraseptal emphysema. Small bilateral pleural effusions lie dependently. Upper Abdomen: Low-attenuation lesions in the upper left retroperitoneum, incompletely imaged but corresponding to large simple cysts in the upper pole of the left kidney seen on prior CT the abdomen and pelvis 09/13/2018. Aortic atherosclerosis. Musculoskeletal: Median sternotomy wires. There are no aggressive appearing lytic or blastic lesions noted in the visualized portions of the skeleton. IMPRESSION: 1. Cardiomegaly with evidence of mild interstitial pulmonary edema in the lungs and small bilateral pleural effusions. These findings suggest congestive heart failure. 2. Because of the presence of pulmonary edema, accurate assessment for interstitial lung disease is not possible on today's examination. If there is persistent clinical concern for interstitial lung disease after resolution of the patient's acute illness,  repeat high-resolution chest CT  could be considered in 6 months to assess for temporal changes in the appearance of the lung parenchyma if clinically appropriate. 3. Mild paraseptal emphysema. 4. Aortic atherosclerosis, in addition to left main and 3 vessel coronary artery disease. Status post median sternotomy for CABG including LIMA to the LAD. Aortic Atherosclerosis (ICD10-I70.0) and Emphysema (ICD10-J43.9). Electronically Signed   By: Trudie Reed M.D.   On: 07/15/2020 07:56   DG CHEST PORT 1 VIEW  Result Date: 07/29/2020 CLINICAL DATA:  79 year old male with central line placement. EXAM: PORTABLE CHEST 1 VIEW COMPARISON:  Chest radiograph dated 07/28/2020. FINDINGS: Right IJ central venous line with tip over central SVC. No pneumothorax. No significant interval change in the appearance of the lungs or cardiomediastinal silhouette. Median sternotomy wires and CABG vascular clips. Atherosclerotic calcification of the aorta. No acute osseous pathology. IMPRESSION: Interval placement of a right IJ central venous line. No pneumothorax. Electronically Signed   By: Elgie Collard M.D.   On: 07/29/2020 20:22   DG Chest Port 1 View  Result Date: 07/28/2020 CLINICAL DATA:  Chest pain. EXAM: PORTABLE CHEST 1 VIEW COMPARISON:  CT 07/14/2020.  Chest x-ray 01/19/2020. FINDINGS: Prior CABG. Stable cardiomegaly. Low lung volumes. No focal infiltrate. No pleural effusion or pneumothorax. IMPRESSION: 1. Prior CABG. Stable cardiomegaly. 2. Low lung volumes. No focal infiltrate. Electronically Signed   By: Maisie Fus  Register   On: 07/28/2020 12:36   ECHOCARDIOGRAM COMPLETE  Result Date: 07/29/2020    ECHOCARDIOGRAM REPORT   Patient Name:   Kevin Flores Date of Exam: 07/29/2020 Medical Rec #:  [id number]    Height:       69.0 in Accession #:    [id number]   Weight:       197.1 lb Date of Birth:  09-28-1940    BSA:          2.053 m Patient Age:    79 years     BP:           131/100 mmHg Patient Gender: M            HR:           86 bpm. Exam  Location:  Inpatient Procedure: 2D Echo, Cardiac Doppler, Color Doppler and Intracardiac            Opacification Agent Indications:    R07.9* Chest pain, unspecified  History:        Patient has prior history of Echocardiogram examinations, most                 recent 06/30/2020. CAD, Prior CABG, Signs/Symptoms:Dyspnea; Risk                 Factors:Hypertension, Dyslipidemia and GERD. Thyroid Disease.  Sonographer:    Elmarie Shiley Dance Referring Phys: [id number] MICHAEL COOPER IMPRESSIONS  1. Severely reduced LV function, 10-15%. LVOT VTI 5 cm. Left ventricular ejection fraction, by estimation, is 10-15%. The left ventricle has severely decreased function. The left ventricle demonstrates global hypokinesis. The left ventricular internal cavity size was mildly to moderately dilated. Left ventricular diastolic parameters are consistent with Grade II diastolic dysfunction (pseudonormalization). Elevated left atrial pressure.  2. Right ventricular systolic function is severely reduced. The right ventricular size is mildly enlarged. There is mildly elevated pulmonary artery systolic pressure. The estimated right ventricular systolic pressure is 47.7 mmHg.  3. Left atrial size was mild to moderately dilated.  4. The mitral valve is grossly normal. Mild to moderate mitral valve  regurgitation.  5. The aortic valve is tricuspid. Aortic valve regurgitation is mild. No aortic stenosis is present.  6. The inferior vena cava is dilated in size with >50% respiratory variability, suggesting right atrial pressure of 8 mmHg. Comparison(s): No significant change from prior study. FINDINGS  Left Ventricle: Severely reduced LV function, 10-15%. LVOT VTI 5 cm. Left ventricular ejection fraction, by estimation, is 10-15%. The left ventricle has severely decreased function. The left ventricle demonstrates global hypokinesis. Definity contrast agent was given IV to delineate the left ventricular endocardial borders. The left ventricular internal  cavity size was mildly to moderately dilated. There is no left ventricular hypertrophy. Left ventricular diastolic parameters are consistent with Grade II diastolic dysfunction (pseudonormalization). Elevated left atrial pressure. Right Ventricle: The right ventricular size is mildly enlarged. No increase in right ventricular wall thickness. Right ventricular systolic function is severely reduced. There is mildly elevated pulmonary artery systolic pressure. The tricuspid regurgitant velocity is 3.15 m/s, and with an assumed right atrial pressure of 8 mmHg, the estimated right ventricular systolic pressure is 47.7 mmHg. Left Atrium: Left atrial size was mild to moderately dilated. Right Atrium: Right atrial size was normal in size. Pericardium: Trivial pericardial effusion is present. Mitral Valve: The mitral valve is grossly normal. Mild to moderate mitral valve regurgitation. Tricuspid Valve: The tricuspid valve is grossly normal. Tricuspid valve regurgitation is trivial. No evidence of tricuspid stenosis. Aortic Valve: The aortic valve is tricuspid. Aortic valve regurgitation is mild. Aortic regurgitation PHT measures 460 msec. No aortic stenosis is present. Pulmonic Valve: The pulmonic valve was grossly normal. Pulmonic valve regurgitation is trivial. No evidence of pulmonic stenosis. Aorta: The aortic root and ascending aorta are structurally normal, with no evidence of dilitation. Venous: The inferior vena cava is dilated in size with greater than 50% respiratory variability, suggesting right atrial pressure of 8 mmHg. IAS/Shunts: The atrial septum is grossly normal. Additional Comments: There is a small pleural effusion in the left lateral region.  LEFT VENTRICLE PLAX 2D LVIDd:         6.00 cm  Diastology LVIDs:         5.50 cm  LV e' medial:    4.26 cm/s LV PW:         1.60 cm  LV E/e' medial:  23.9 LV IVS:        0.80 cm  LV e' lateral:   4.74 cm/s LVOT diam:     2.00 cm  LV E/e' lateral: 21.5 LV SV:          16 LV SV Index:   8 LVOT Area:     3.14 cm  RIGHT VENTRICLE            IVC RV Basal diam:  3.70 cm    IVC diam: 2.30 cm RV Mid diam:    2.60 cm RV S prime:     3.09 cm/s TAPSE (M-mode): 1.1 cm LEFT ATRIUM              Index       RIGHT ATRIUM           Index LA diam:        5.10 cm  2.48 cm/m  RA Area:     18.70 cm LA Vol (A2C):   111.0 ml 54.06 ml/m RA Volume:   53.50 ml  26.06 ml/m LA Vol (A4C):   57.6 ml  28.05 ml/m LA Biplane Vol: 85.2 ml  41.50 ml/m  AORTIC VALVE LVOT  Vmax:   38.35 cm/s LVOT Vmean:  24.000 cm/s LVOT VTI:    0.049 m AI PHT:      460 msec  AORTA Ao Root diam: 3.90 cm Ao Asc diam:  3.90 cm MITRAL VALVE                TRICUSPID VALVE MV Area (PHT): 5.54 cm     TR Peak grad:   39.7 mmHg MV Decel Time: 137 msec     TR Vmax:        315.00 cm/s MV E velocity: 102.00 cm/s MV A velocity: 74.90 cm/s   SHUNTS MV E/A ratio:  1.36         Systemic VTI:  0.05 m                             Systemic Diam: 2.00 cm Lennie Odor MD Electronically signed by Lennie Odor MD Signature Date/Time: 07/29/2020/11:05:51 AM    Final    VAS [place] LOWER EXTREMITY VENOUS (DVT)  Result Date: 08/05/2020  Lower Venous DVT Study Indications: Edema.  Comparison Study: no prior Performing Technologist: Blanch Media RVS  Examination Guidelines: A complete evaluation includes B-mode imaging, spectral Doppler, color Doppler, and power Doppler as needed of all accessible portions of each vessel. Bilateral testing is considered an integral part of a complete examination. Limited examinations for reoccurring indications may be performed as noted. The reflux portion of the exam is performed with the patient in reverse Trendelenburg.  +---------+---------------+---------+-----------+----------+-----------------+ RIGHT    CompressibilityPhasicitySpontaneityPropertiesThrombus Aging    +---------+---------------+---------+-----------+----------+-----------------+ CFV      None           No       No                    Age Indeterminate +---------+---------------+---------+-----------+----------+-----------------+ SFJ      None                                         Age Indeterminate +---------+---------------+---------+-----------+----------+-----------------+ FV Prox  None                                         Age Indeterminate +---------+---------------+---------+-----------+----------+-----------------+ FV Mid   None                                         Age Indeterminate +---------+---------------+---------+-----------+----------+-----------------+ FV DistalNone                                         Age Indeterminate +---------+---------------+---------+-----------+----------+-----------------+ PFV      None                                         Age Indeterminate +---------+---------------+---------+-----------+----------+-----------------+ POP      None           No       No  Age Indeterminate +---------+---------------+---------+-----------+----------+-----------------+ PTV      None                                         Age Indeterminate +---------+---------------+---------+-----------+----------+-----------------+ PERO     None                                         Age Indeterminate +---------+---------------+---------+-----------+----------+-----------------+ EIV                     No       No                   Age Indeterminate +---------+---------------+---------+-----------+----------+-----------------+   +---------+---------------+---------+-----------+----------+--------------+ LEFT     CompressibilityPhasicitySpontaneityPropertiesThrombus Aging +---------+---------------+---------+-----------+----------+--------------+ CFV      Full           Yes      Yes                                 +---------+---------------+---------+-----------+----------+--------------+ SFJ      Full                                                         +---------+---------------+---------+-----------+----------+--------------+ FV Prox  Full                                                        +---------+---------------+---------+-----------+----------+--------------+ FV Mid   Full                                                        +---------+---------------+---------+-----------+----------+--------------+ FV DistalFull                                                        +---------+---------------+---------+-----------+----------+--------------+ PFV      Full                                                        +---------+---------------+---------+-----------+----------+--------------+ POP      Full           Yes      Yes                                 +---------+---------------+---------+-----------+----------+--------------+ PTV      Full                                                        +---------+---------------+---------+-----------+----------+--------------+  PERO     Full                                                        +---------+---------------+---------+-----------+----------+--------------+     Summary: RIGHT: - Findings consistent with age indeterminate deep vein thrombosis involving the right common femoral vein, SF junction, right femoral vein, right proximal profunda vein, right popliteal vein, right peroneal veins, and right gastrocnemius veins. - EIV appears to be occluded. further testing may be warranted.   *See table(s) above for measurements and observations.    Preliminary    [place] EKG SITE RITE  Result Date: 07/29/2020 If Site Rite image not attached, placement could not be confirmed due to current cardiac rhythm.  CT Maxillofacial LTD WO CM  Result Date: 07/15/2020 CLINICAL DATA:  Deviated nasal septum.  Chronic cough EXAM: CT PARANASAL SINUS LIMITED WITHOUT CONTRAST TECHNIQUE: Non-contiguous multidetector CT images of the paranasal  sinuses were obtained in a single plane without contrast. COMPARISON:  None. FINDINGS: Clear paranasal sinuses. Leftward nasal septal deviation and spurring contacting the inferior turbinate. Edentulous with alveolar ridge atrophy. No evidence of fracture or bone lesion. No visible soft tissue inflammation.  Negative orbits. IMPRESSION: 1. Clear paranasal sinuses. 2. Leftward septal deviation and spurring. Electronically Signed   By: Marnee Spring M.D.   On: 07/15/2020 06:57    Labs:  CBC: Recent Labs    08/02/20 0430 08/03/20 0436 08/04/20 0351 08/05/20 0500  WBC 11.6* 9.4 10.6* 13.3*  HGB 13.7 14.0 14.6 15.3  HCT 42.8 43.0 45.4 47.1  PLT 157 172 188 169    COAGS: Recent Labs    07/28/20 1155  INR 1.2  APTT 31    BMP: Recent Labs    01/19/20 0807 06/21/20 1028 07/19/20 0821 07/27/20 1023 08/02/20 0430 08/03/20 0436 08/04/20 0351 08/05/20 0500  NA 142   < > 144   < > 136 137 137 138  K 4.7   < > 4.7   < > 4.2 3.6 4.3 4.1  CL 104   < > 103   < > 95* 96* 96* 95*  CO2 22   < > 24   < > 26 30 27 22   GLUCOSE 113*   < > 95   < > 112* 155* 137* 140*  BUN 17   < > 19   < > 39* 43* 51* 76*  CALCIUM 9.7   < > 10.1   < > 9.3 9.3 9.5 9.8  CREATININE 1.54*   < > 1.87*   < > 2.09* 2.05* 2.34* 3.20*  GFRNONAA 43*   < > 34*   < > 32* 33* 28* 19*  GFRAA 50*  --  39*  --   --   --   --   --    < > = values in this interval not displayed.    LIVER FUNCTION TESTS: Recent Labs    01/19/20 0807 06/21/20 1028 07/28/20 1429  BILITOT 0.8 1.5* 1.3*  AST 22 20 25   ALT 14 17 17   ALKPHOS 110 72 84  PROT 7.1 7.2 7.3  ALBUMIN 4.1 3.8 3.5    TUMOR MARKERS: No results for input(s): AFPTM, CEA, CA199, CHROMGRNA in the last 8760 hours.  Assessment and Plan: Acute (R)LE DVT Hx CHF, CAD Plan for IVC filter  placement. Can consider thrombectomy/thrombolysis if not improving on conservative tx. Labs reviewed, worsening renal fxn. Risks and benefits discussed with the patient  including, but not limited to bleeding, infection, contrast induced renal failure, filter fracture or migration which can lead to emergency surgery or even death, strut penetration with damage or irritation to adjacent structures and caval thrombosis.  All of the patient's questions were answered, patient is agreeable to proceed. Consent signed and in chart.    Thank you for this interesting consult.  I greatly enjoyed meeting Kevin Flores and look forward to participating in their care.  A copy of this report was sent to the requesting provider on this date.  Electronically Signed: Brayton El, PA-C 08/05/2020, 1:12 PM   I spent a total of 20 minutes in face to face in clinical consultation, greater than 50% of which was counseling/coordinating care for IVC filter

## 2020-08-05 NOTE — Progress Notes (Signed)
1055  Read note from Heart Failure. Will hold ambulation at this time and continue to follow pt. Luetta Nutting RN BSN 08/05/2020 10:56 AM

## 2020-08-05 NOTE — Progress Notes (Signed)
ANTICOAGULATION CONSULT NOTE - Initial Consult  Pharmacy Consult for Heparin Indication: DVT  Allergies  Allergen Reactions  . Imdur [Isosorbide Dinitrate] Other (See Comments)    Headache, "made my head swim"  . Rosuvastatin Other (See Comments)    SEVERE muscle and leg cramps/aches  . Sulfonamide Derivatives Rash and Other (See Comments)    Also "made the whites of my eyes turn yellow"    Patient Measurements: Height: 5\' 9"  (175.3 cm) Weight: 82.6 kg (182 lb 1.6 oz) IBW/kg (Calculated) : 70.7   Vital Signs: Temp: 97.6 F (36.4 C) (11/26 0800) Temp Source: Oral (11/26 0800) BP: 108/88 (11/26 0800) Pulse Rate: 91 (11/26 0800)  Labs: Recent Labs    08/03/20 0436 08/03/20 0436 08/04/20 0351 08/05/20 0500  HGB 14.0   < > 14.6 15.3  HCT 43.0  --  45.4 47.1  PLT 172  --  188 169  CREATININE 2.05*  --  2.34* 3.20*   < > = values in this interval not displayed.    Estimated Creatinine Clearance: 19.3 mL/min (A) (by C-G formula based on SCr of 3.2 mg/dL (H)).   Medical History: Past Medical History:  Diagnosis Date  . Allergy    seasonal  . Arthritis   . CAD (coronary artery disease)    1998 LIMA to the LAD, SVG to circumflex. DES placed to the PDA 11/2009   . GERD with stricture   . Gout   . Heartburn   . Hyperlipidemia   . Hypertension   . Nephrolithiasis    left  . Personal history of colonic polyps    adenomas  . Thyroid disease     Assessment: Pt is a 79 YOM on DAPT with noted leg swelling on exam and echo confirming extensive DVT. Plan for IVC placement and anticoagulation. Pharmacy has been consulted to initiate heparin. CBC stable (hgb 15.3, plt 169) and no bleeding noted.   Goal of Therapy:  Heparin level 0.3-0.7 units/ml Monitor platelets by anticoagulation protocol: Yes   Plan:  Give 4000 units bolus x 1 Start heparin infusion at 1400 units/hr Check heparin level in 8 hours Daily CBC and heparin level while on heparin  12/2009 PharmD  Candidate 2022 08/05/2020 11:54 AM

## 2020-08-05 NOTE — Progress Notes (Addendum)
Advanced Heart Failure Rounding Note   Subjective:    Started on milrinone 11/19 for low output HF/shock. Milrinone stopped 11/25. Co-ox 65% -> 48% -> 43%. Creatinine 2.0 -> 2.3 -> 3.2. CVP 3  Feels weak. Denies SOB, orthopnea or PND. + severe cramp in RLE  Objective:   Weight Range:  Vital Signs:   Temp:  [97.5 F (36.4 C)-98.8 F (37.1 C)] 97.6 F (36.4 C) (11/26 0800) Pulse Rate:  [84-101] 91 (11/26 0800) Resp:  [12-22] 20 (11/26 0800) BP: (107-123)/(74-96) 108/88 (11/26 0800) SpO2:  [95 %-98 %] 97 % (11/26 0834) Weight:  [82.6 kg] 82.6 kg (11/26 0307) Last BM Date: 08/03/20  Weight change: Filed Weights   08/02/20 0327 08/03/20 0506 08/05/20 0307  Weight: 81.5 kg 80.1 kg 82.6 kg    Intake/Output:   Intake/Output Summary (Last 24 hours) at 08/05/2020 1019 Last data filed at 08/05/2020 0954 Gross per 24 hour  Intake 343 ml  Output 300 ml  Net 43 ml     General:  Elderly weak appearing. No resp difficulty HEENT: normal Neck: supple. JVP flat. Carotids 2+ bilat; no bruits. No lymphadenopathy or thryomegaly appreciated. Cor: PMI nondisplaced. Regular rate & rhythm. + s3 Lungs: clear Abdomen: soft, nontender, nondistended. No hepatosplenomegaly. No bruits or masses. Good bowel sounds. Extremities: no cyanosis, clubbing, rash,  RLE 2-3+ tight edema. No edema on L Neuro: alert & orientedx3, cranial nerves grossly intact. moves all 4 extremities w/o difficulty. Affect pleasant   Telemetry: SR 90-110 +PVCs Personally reviewed  Labs: Basic Metabolic Panel: Recent Labs  Lab 08/01/20 0500 08/01/20 0500 08/02/20 0430 08/02/20 0430 08/03/20 0436 08/03/20 0809 08/04/20 0351 08/05/20 0500  NA 137  --  136  --  137  --  137 138  K 3.8  --  4.2  --  3.6  --  4.3 4.1  CL 97*  --  95*  --  96*  --  96* 95*  CO2 27  --  26  --  30  --  27 22  GLUCOSE 117*  --  112*  --  155*  --  137* 140*  BUN 32*  --  39*  --  43*  --  51* 76*  CREATININE 2.09*  --  2.09*   --  2.05*  --  2.34* 3.20*  CALCIUM 9.0   < > 9.3   < > 9.3  --  9.5 9.8  MG  --   --  2.2  --   --  2.4  --   --    < > = values in this interval not displayed.    Liver Function Tests: No results for input(s): AST, ALT, ALKPHOS, BILITOT, PROT, ALBUMIN in the last 168 hours. No results for input(s): LIPASE, AMYLASE in the last 168 hours. No results for input(s): AMMONIA in the last 168 hours.  CBC: Recent Labs  Lab 08/01/20 0500 08/02/20 0430 08/03/20 0436 08/04/20 0351 08/05/20 0500  WBC 13.7* 11.6* 9.4 10.6* 13.3*  HGB 13.4 13.7 14.0 14.6 15.3  HCT 40.8 42.8 43.0 45.4 47.1  MCV 92.9 92.6 92.5 91.3 92.2  PLT 129* 157 172 188 169    Cardiac Enzymes: No results for input(s): CKTOTAL, CKMB, CKMBINDEX, TROPONINI in the last 168 hours.  BNP: BNP (last 3 results) Recent Labs    06/09/20 1012 07/29/20 1643  BNP 774.8* 3,099.8*    ProBNP (last 3 results) No results for input(s): PROBNP in the last 8760 hours.  Other results:  Imaging: DG Chest 2 View  Result Date: 08/03/2020 CLINICAL DATA:  Continued shortness of breath and cough EXAM: CHEST - 2 VIEW COMPARISON:  07/29/2020 FINDINGS: Cardiac shadow remains enlarged. Postsurgical changes are again seen. Aortic calcifications are noted. Right jugular central line is again seen and stable. Lungs are well aerated bilaterally. No focal infiltrate or sizable effusion is seen. Mild fibrotic changes are again seen and stable IMPRESSION: No acute abnormality noted. Electronically Signed   By: Alcide Clever M.D.   On: 08/03/2020 12:33     Medications:     Scheduled Medications:  allopurinol  300 mg Oral Daily   aspirin  81 mg Oral Daily   atorvastatin  20 mg Oral Daily   Chlorhexidine Gluconate Cloth  6 each Topical Daily   clopidogrel  75 mg Oral Daily   doxycycline  100 mg Oral Q12H   fluticasone  1 spray Each Nare Daily   guaiFENesin  600 mg Oral BID   heparin injection (subcutaneous)  5,000 Units  Subcutaneous Q8H   levothyroxine  75 mcg Oral Q0600   loratadine  10 mg Oral QPM   mometasone-formoterol  2 puff Inhalation BID   montelukast  10 mg Oral QHS   pantoprazole  40 mg Oral Daily   polyethylene glycol  17 g Oral Daily   sodium chloride flush  10-40 mL Intracatheter Q12H   sodium chloride flush  3 mL Intravenous Q12H   sorbitol  30 mL Oral Once   torsemide  40 mg Oral BID    Infusions:  sodium chloride 20 mL/hr at 07/28/20 1205   sodium chloride     milrinone 0.25 mcg/kg/min (08/05/20 0849)    PRN Medications: sodium chloride, acetaminophen, alum & mag hydroxide-simeth, melatonin, morphine injection, ondansetron (ZOFRAN) IV, oxyCODONE, sodium chloride flush, sodium chloride flush, traZODone   Assessment/Plan:   1. Acute on chronic systolic HF -> cardiogenic shock - echo 3/19 EF 50-55% - echo 10/21 EF < 20% RV moderately reduced - echo 11/21 EF < 10-15 % RV moderately reduced mild to moderate MR - developed post MI shock with increasing lactate and AKI - Suspect initial decrease in EF from 3/19 -> 10/21 was nonischemic in nature (?PVCs) but no situation c/b large anterior infract with LIMA occlusion from cath films.  - Milrinone started 11/21 and stopped 11/24 - Now back in shock.  - Co-ox down, CVP down Creatinine worse. He is back in shock. Suspect some of this may be hypovolemic. Will give 250 NS. Stop diuretics. Will restart milrinone. I worry he may be inotrope dependent Consult palliative care to assess GOC. May need Hospice - No ARB/ARNI with AKI.  - No b-blocker yet with shock  - He is not VAD candidate due to age, fraility and CKD  2. RLE swelling (unilateral) - almost certainly has DVT (doubt PE with low CVP) - restart heparin empirically. - get venous u/s  3. 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 Continue DAPT, statin - no -blocker with shock - no s/s angina  4. AKI on CKD 3b - baseline  creatinine 1.6-1.87. - due to ATN/shock - Up from 2.05 -> 2.34 -> 3.2 after stopping milrinone. Will restart - Plan as above  5. Thombocytopenia -resolved  6. Cough -hx of persistent cough outpt, had PFTs - CT chest 11/4 showed interstitial pulmonary edema w/ small b/l pleural effusion; interstitial lung disease could not be evaluated; mild paraseptal emphysema - on  doxy for possible bronchitis  7. Insomnia - continue trazodone  8. Leukocyctosis - Afebrile. - on doxy for possible bronchitis. ? Related to possible DVT - follow   CRITICAL CARE Performed by: Arvilla Meres  Total critical care time: 35 minutes  Critical care time was exclusive of separately billable procedures and treating other patients.  Critical care was necessary to treat or prevent imminent or life-threatening deterioration.  Critical care was time spent personally by me (independent of midlevel providers or residents) on the following activities: development of treatment plan with patient and/or surrogate as well as nursing, discussions with consultants, evaluation of patient's response to treatment, examination of patient, obtaining history from patient or surrogate, ordering and performing treatments and interventions, ordering and review of laboratory studies, ordering and review of radiographic studies, pulse oximetry and re-evaluation of patient's condition.    Length of Stay: 8   [name] 08/05/2020, 10:19 AM  Advanced Heart Failure Team Pager [phone] (M-F; 7a - 4p)  Please contact [facility] for night-coverage after hours (4p -7a ) and weekends on [email or web address]

## 2020-08-05 NOTE — Progress Notes (Signed)
ANTICOAGULATION CONSULT NOTE  Pharmacy Consult for Heparin Indication: DVT  Allergies  Allergen Reactions  . Imdur [Isosorbide Dinitrate] Other (See Comments)    Headache, "made my head swim"  . Rosuvastatin Other (See Comments)    SEVERE muscle and leg cramps/aches  . Sulfonamide Derivatives Rash and Other (See Comments)    Also "made the whites of my eyes turn yellow"    Patient Measurements: Height: 5\' 9"  (175.3 cm) Weight: 82.6 kg (182 lb 1.6 oz) IBW/kg (Calculated) : 70.7   Vital Signs: Temp: 97.7 F (36.5 C) (11/26 1731) Temp Source: Oral (11/26 1731) BP: 114/74 (11/26 1731) Pulse Rate: 91 (11/26 1731)  Labs: Recent Labs    08/03/20 0436 08/03/20 0436 08/04/20 0351 08/05/20 0500 08/05/20 1900  HGB 14.0   < > 14.6 15.3  --   HCT 43.0  --  45.4 47.1  --   PLT 172  --  188 169  --   HEPARINUNFRC  --   --   --   --  0.17*  CREATININE 2.05*  --  2.34* 3.20*  --    < > = values in this interval not displayed.    Estimated Creatinine Clearance: 19.3 mL/min (A) (by C-G formula based on SCr of 3.2 mg/dL (H)).   Assessment: 79 YOM on DAPT with noted leg swelling on exam and Doppler confirming extensive DVT.  Pharmacy has been consulted for heparin dosing.   Patient is s/p IVC filter placement this afternoon.  RN reports that heparin was not interrupted while patient was off the floor.  Heparin level is sub-therapeutic; no bleeding reported.  Goal of Therapy:  Heparin level 0.3-0.7 units/ml Monitor platelets by anticoagulation protocol: Yes   Plan:  Increase heparin gtt to 1600 units/hr Check 8 hr heparin level  Zacharie Portner D. 08/07/20, PharmD, BCPS, BCCCP 08/05/2020, 8:01 PM

## 2020-08-05 NOTE — Progress Notes (Signed)
Lower extremity venous has been completed.   Preliminary results in CV Proc.   Results given to RN.   Blanch Media 08/05/2020 11:22 AM

## 2020-08-06 DIAGNOSIS — I5023 Acute on chronic systolic (congestive) heart failure: Secondary | ICD-10-CM | POA: Diagnosis not present

## 2020-08-06 LAB — BASIC METABOLIC PANEL
Anion gap: 16 — ABNORMAL HIGH (ref 5–15)
BUN: 95 mg/dL — ABNORMAL HIGH (ref 8–23)
CO2: 26 mmol/L (ref 22–32)
Calcium: 9.1 mg/dL (ref 8.9–10.3)
Chloride: 94 mmol/L — ABNORMAL LOW (ref 98–111)
Creatinine, Ser: 3.42 mg/dL — ABNORMAL HIGH (ref 0.61–1.24)
GFR, Estimated: 18 mL/min — ABNORMAL LOW (ref >60)
Glucose, Bld: 139 mg/dL — ABNORMAL HIGH (ref 70–99)
Potassium: 4.2 mmol/L (ref 3.5–5.1)
Sodium: 136 mmol/L (ref 135–145)

## 2020-08-06 LAB — COOXEMETRY PANEL
Carboxyhemoglobin: 1.2 % (ref 0.5–1.5)
Methemoglobin: 1 % (ref 0.0–1.5)
O2 Saturation: 64.3 %
Total hemoglobin: 14.5 g/dL (ref 12.0–16.0)

## 2020-08-06 LAB — HEPARIN LEVEL (UNFRACTIONATED)
Heparin Unfractionated: <0.1 IU/mL — ABNORMAL LOW (ref 0.30–0.70)
Heparin Unfractionated: 1.07 IU/mL — ABNORMAL HIGH (ref 0.30–0.70)

## 2020-08-06 LAB — CBC
HCT: 43.2 % (ref 39.0–52.0)
Hemoglobin: 14 g/dL (ref 13.0–17.0)
MCH: 29.6 pg (ref 26.0–34.0)
MCHC: 32.4 g/dL (ref 30.0–36.0)
MCV: 91.3 fL (ref 80.0–100.0)
Platelets: 167 10*3/uL (ref 150–400)
RBC: 4.73 MIL/uL (ref 4.22–5.81)
RDW: 14.1 % (ref 11.5–15.5)
WBC: 18.4 10*3/uL — ABNORMAL HIGH (ref 4.0–10.5)
nRBC: 0.1 % (ref 0.0–0.2)

## 2020-08-06 MED ORDER — HEPARIN BOLUS VIA INFUSION
2000.0000 [IU] | Freq: Once | INTRAVENOUS | Status: AC
  Administered 2020-08-06: 2000 [IU] via INTRAVENOUS
  Filled 2020-08-06: qty 2000

## 2020-08-06 MED ORDER — HEPARIN (PORCINE) 25000 UT/250ML-% IV SOLN
1250.0000 [IU]/h | INTRAVENOUS | Status: AC
  Administered 2020-08-06: 1600 [IU]/h via INTRAVENOUS
  Administered 2020-08-07: 1400 [IU]/h via INTRAVENOUS
  Administered 2020-08-08: 1300 [IU]/h via INTRAVENOUS
  Administered 2020-08-09: 1250 [IU]/h via INTRAVENOUS
  Filled 2020-08-06 (×3): qty 250

## 2020-08-06 NOTE — Progress Notes (Signed)
Discussed with RN. Will plan to get to recliner after lunch as pt declined mobility this am due to pain. We will f/u Monday. Ethelda Chick CES, ACSM 11:55 AM 08/06/2020

## 2020-08-06 NOTE — Progress Notes (Signed)
ANTICOAGULATION CONSULT NOTE  Pharmacy Consult for Heparin Indication: DVT  Allergies  Allergen Reactions  . Imdur [Isosorbide Dinitrate] Other (See Comments)    Headache, "made my head swim"  . Rosuvastatin Other (See Comments)    SEVERE muscle and leg cramps/aches  . Sulfonamide Derivatives Rash and Other (See Comments)    Also "made the whites of my eyes turn yellow"    Patient Measurements: Height: 5\' 9"  (175.3 cm) Weight: 80.8 kg (178 lb 2.1 oz) IBW/kg (Calculated) : 70.7  Heparin dosing weight: 82.6 kg  Vital Signs: Temp: 98.3 F (36.8 C) (11/27 0723) Temp Source: Oral (11/27 0723) BP: 107/82 (11/27 0400) Pulse Rate: 98 (11/27 0400)  Labs: Recent Labs    08/04/20 0351 08/04/20 0351 08/05/20 0500 08/05/20 1900 08/06/20 0349 08/06/20 0621  HGB 14.6   < > 15.3  --  14.0  --   HCT 45.4  --  47.1  --  43.2  --   PLT 188  --  169  --  167  --   HEPARINUNFRC  --   --   --  0.17*  --  <0.10*  CREATININE 2.34*  --  3.20*  --  3.42*  --    < > = values in this interval not displayed.    Estimated Creatinine Clearance: 18.1 mL/min (A) (by C-G formula based on SCr of 3.42 mg/dL (H)).   Assessment: 79 YOM on DAPT with noted leg swelling on exam and Doppler confirming extensive DVT.  Pharmacy has been consulted for heparin dosing. Patient is s/p IVC filter placement on 11/26.    Heparin level now undetectable after increasing heparin drip rate to 1600 units/hr. Confirmed with RN that heparin was not turned off. No bleeding or infusion issues. CBC wnl. Renal function worsening with Scr 3.42.   Goal of Therapy:  Heparin level 0.3-0.7 units/ml Monitor platelets by anticoagulation protocol: Yes   Plan:  Give 2,000 unit IV heparin bolus Increase heparin infusion to 1900 units/hr Check 8-hr HL Monitor daily HL, CBC, s/sx bleeding  12/26, PharmD, BCPS PGY2 Cardiology Pharmacy Resident Phone: [phone] 08/06/2020  7:39 AM  Please check [email or web address] for  unit-specific pharmacy phone numbers.

## 2020-08-06 NOTE — Progress Notes (Signed)
ANTICOAGULATION CONSULT NOTE  Pharmacy Consult for Heparin Indication: DVT  Allergies  Allergen Reactions  . Imdur [Isosorbide Dinitrate] Other (See Comments)    Headache, "made my head swim"  . Rosuvastatin Other (See Comments)    SEVERE muscle and leg cramps/aches  . Sulfonamide Derivatives Rash and Other (See Comments)    Also "made the whites of my eyes turn yellow"    Patient Measurements: Height: 5\' 9"  (175.3 cm) Weight: 80.8 kg (178 lb 2.1 oz) IBW/kg (Calculated) : 70.7  Heparin dosing weight: 82.6 kg  Vital Signs: Temp: 97.6 F (36.4 C) (11/27 1153) Temp Source: Oral (11/27 0840) BP: 108/83 (11/27 0840) Pulse Rate: 103 (11/27 0840)  Labs: Recent Labs    08/04/20 0351 08/04/20 0351 08/05/20 0500 08/05/20 1900 08/06/20 0349 08/06/20 0621 08/06/20 1426  HGB 14.6   < > 15.3  --  14.0  --   --   HCT 45.4  --  47.1  --  43.2  --   --   PLT 188  --  169  --  167  --   --   HEPARINUNFRC  --   --   --  0.17*  --  <0.10* 1.07*  CREATININE 2.34*  --  3.20*  --  3.42*  --   --    < > = values in this interval not displayed.    Estimated Creatinine Clearance: 18.1 mL/min (A) (by C-G formula based on SCr of 3.42 mg/dL (H)).   Assessment: 79 YOM on DAPT with noted leg swelling on exam and Doppler confirming extensive DVT.  Pharmacy has been consulted for heparin dosing. Patient is s/p IVC filter placement on 11/26.    Heparin level was undetectable this am and possible problem with IV line - because of DVT and clot burden heparin drip was increased and a small bolus given - because patient complaining about IV site - RN changed heparin infusion to CL and afternoon heparin level drawn from peripheral stick.  Heparin level 1.07 - elevated > goal.  CBC wnl. Renal function worsening with Scr 3.42.  Patient has new peripheral IV site in L arm   Will hold heparin for an hour and restart at lower rate - will move heparin to new IV site so that labs can be drawn from  CL  Goal of Therapy:  Heparin level 0.3-0.7 units/ml Monitor platelets by anticoagulation protocol: Yes   Plan:  Hold heparin x1 hr Resume heparin drip at 1600 uts/hr  Monitor daily HL, CBC, s/sx bleeding  12/26 Pharm.D. CPP, BCPS Clinical Pharmacist [phone] 08/06/2020 4:09 PM    Please check [email or web address] for unit-specific pharmacy phone numbers.

## 2020-08-06 NOTE — Progress Notes (Signed)
Advanced Heart Failure Rounding Note   Subjective:    Started on milrinone 11/19 for low output HF/shock. Milrinone stopped 11/25.  Co-ox dropped and creatinine rose, milrinone restarted 11/26.  Currently on milrinone 0.25 with co-ox up to 64%.  CVP 7, he is on diuretics with AKI.   Creatinine 2.0 -> 2.3 -> 3.2 -> 3.4.   Extensive right leg DVT diagnosed 11/26 with unilateral swelling.  Now on heparin gtt and IVC filter was placed 11/26.   Feels better today. No dyspnea at rest.   Objective:   Weight Range:  Vital Signs:   Temp:  [97.4 F (36.3 C)-98.3 F (36.8 C)] 98.3 F (36.8 C) (11/27 0840) Pulse Rate:  [91-103] 103 (11/27 0840) Resp:  [14-24] 19 (11/27 0840) BP: (91-116)/(71-87) 108/83 (11/27 0840) SpO2:  [91 %-100 %] 98 % (11/27 0840) Weight:  [80.8 kg] 80.8 kg (11/27 0643) Last BM Date: 08/04/20  Weight change: Filed Weights   08/03/20 0506 08/05/20 0307 08/06/20 0643  Weight: 80.1 kg 82.6 kg 80.8 kg    Intake/Output:   Intake/Output Summary (Last 24 hours) at 08/06/2020 1025 Last data filed at 08/06/2020 0923 Gross per 24 hour  Intake 437.22 ml  Output 300 ml  Net 137.22 ml    General: NAD Neck: No JVD, no thyromegaly or thyroid nodule.  Lungs: Clear to auscultation bilaterally with normal respiratory effort. CV: Lateral PMI.  Heart regular S1/S2, no S3/S4, no murmur.  2+ edema to knee on right.  Abdomen: Soft, nontender, no hepatosplenomegaly, no distention.  Skin: Intact without lesions or rashes.  Neurologic: Alert and oriented x 3.  Psych: Normal affect. Extremities: No clubbing or cyanosis.  HEENT: Normal.   Telemetry: NSR 90s with PVCs Personally reviewed  Labs: Basic Metabolic Panel: Recent Labs  Lab 08/02/20 0430 08/02/20 0430 08/03/20 0436 08/03/20 0436 08/03/20 0809 08/04/20 0351 08/05/20 0500 08/06/20 0349  NA 136  --  137  --   --  137 138 136  K 4.2  --  3.6  --   --  4.3 4.1 4.2  CL 95*  --  96*  --   --  96* 95* 94*    CO2 26  --  30  --   --  27 22 26   GLUCOSE 112*  --  155*  --   --  137* 140* 139*  BUN 39*  --  43*  --   --  51* 76* 95*  CREATININE 2.09*  --  2.05*  --   --  2.34* 3.20* 3.42*  CALCIUM 9.3   < > 9.3   < >  --  9.5 9.8 9.1  MG 2.2  --   --   --  2.4  --   --   --    < > = values in this interval not displayed.    Liver Function Tests: No results for input(s): AST, ALT, ALKPHOS, BILITOT, PROT, ALBUMIN in the last 168 hours. No results for input(s): LIPASE, AMYLASE in the last 168 hours. No results for input(s): AMMONIA in the last 168 hours.  CBC: Recent Labs  Lab 08/02/20 0430 08/03/20 0436 08/04/20 0351 08/05/20 0500 08/06/20 0349  WBC 11.6* 9.4 10.6* 13.3* 18.4*  HGB 13.7 14.0 14.6 15.3 14.0  HCT 42.8 43.0 45.4 47.1 43.2  MCV 92.6 92.5 91.3 92.2 91.3  PLT 157 172 188 169 167    Cardiac Enzymes: No results for input(s): CKTOTAL, CKMB, CKMBINDEX, TROPONINI in the last 168  hours.  BNP: BNP (last 3 results) Recent Labs    06/09/20 1012 07/29/20 1643  BNP 774.8* 3,099.8*    ProBNP (last 3 results) No results for input(s): PROBNP in the last 8760 hours.    Other results:  Imaging: IR IVC FILTER PLMT / S&I Lenise Arena GUID/MOD SED  Result Date: 08/06/2020 INDICATION: 79 year old male with acute on chronic congestive heart failure and cardiogenic shock with extensive right lower extremity DVT. EXAM: ULTRASOUND GUIDANCE FOR VASCULAR ACCESS IVC CATHETERIZATION AND VENOGRAM IVC FILTER INSERTION COMPARISON:  None. MEDICATIONS: None. ANESTHESIA/SEDATION: Fentanyl 25 mcg IV; Versed 1 mg IV Sedation Time: 11 minutes; The patient was continuously monitored during the procedure by the interventional radiology nurse under my direct supervision. CONTRAST:  50 mL CO2 FLUOROSCOPY TIME:  One minutes 42 seconds (46 mGy) COMPLICATIONS: None immediate PROCEDURE: Informed consent was obtained from the patient following explanation of the procedure, risks, benefits and alternatives. The  patient understands, agrees and consents for the procedure. All questions were addressed. A time out was performed prior to the initiation of the procedure. Maximal barrier sterile technique utilized including caps, mask, sterile gowns, sterile gloves, large sterile drape, hand hygiene, and Betadine prep. Under sterile condition and local anesthesia, right internal jugular venous access was performed with ultrasound. An ultrasound image was saved and sent to PACS. Over a guidewire, the IVC filter delivery sheath and inner dilator were advanced into the IVC just above the IVC bifurcation. Contrast injection was performed for an IVC venogram. Through the delivery sheath, a retrievable Denali IVC filter was deployed below the level of the renal veins and above the IVC bifurcation. Limited post deployment venacavagram was performed. The delivery sheath was removed and hemostasis was obtained with manual compression. A dressing was placed. The patient tolerated the procedure well without immediate post procedural complication. FINDINGS: The IVC is patent. No evidence of thrombus, stenosis, or occlusion. No variant venous anatomy. Successful placement of the IVC filter below the level of the renal veins. IMPRESSION: Successful ultrasound and fluoroscopically guided placement of an infrarenal retrievable IVC filter via right jugular approach. PLAN: This IVC filter is potentially retrievable. The patient will be assessed for filter retrieval by Interventional Radiology in approximately 8-12 weeks. Further recommendations regarding filter retrieval, continued surveillance or declaration of device permanence, will be made at that time. Marliss Coots, MD [facility] Electronically Signed   By: Marliss Coots MD   On: 08/06/2020 08:07   IR US Guide Vasc Access Right  Result Date: 08/06/2020 INDICATION: 79 year old male with acute on chronic congestive heart failure  and cardiogenic shock with extensive right lower extremity DVT. EXAM: ULTRASOUND GUIDANCE FOR VASCULAR ACCESS IVC CATHETERIZATION AND VENOGRAM IVC FILTER INSERTION COMPARISON:  None. MEDICATIONS: None. ANESTHESIA/SEDATION: Fentanyl 25 mcg IV; Versed 1 mg IV Sedation Time: 11 minutes; The patient was continuously monitored during the procedure by the interventional radiology nurse under my direct supervision. CONTRAST:  50 mL CO2 FLUOROSCOPY TIME:  One minutes 42 seconds (46 mGy) COMPLICATIONS: None immediate PROCEDURE: Informed consent was obtained from the patient following explanation of the procedure, risks, benefits and alternatives. The patient understands, agrees and consents for the procedure. All questions were addressed. A time out was performed prior to the initiation of the procedure. Maximal barrier sterile technique utilized including caps, mask, sterile gowns, sterile gloves, large sterile drape, hand hygiene, and Betadine prep. Under sterile condition and local anesthesia, right internal jugular venous access was performed with ultrasound. An ultrasound image  was saved and sent to PACS. Over a guidewire, the IVC filter delivery sheath and inner dilator were advanced into the IVC just above the IVC bifurcation. Contrast injection was performed for an IVC venogram. Through the delivery sheath, a retrievable Denali IVC filter was deployed below the level of the renal veins and above the IVC bifurcation. Limited post deployment venacavagram was performed. The delivery sheath was removed and hemostasis was obtained with manual compression. A dressing was placed. The patient tolerated the procedure well without immediate post procedural complication. FINDINGS: The IVC is patent. No evidence of thrombus, stenosis, or occlusion. No variant venous anatomy. Successful placement of the IVC filter below the level of the renal veins. IMPRESSION: Successful ultrasound and fluoroscopically guided placement of an  infrarenal retrievable IVC filter via right jugular approach. PLAN: This IVC filter is potentially retrievable. The patient will be assessed for filter retrieval by Interventional Radiology in approximately 8-12 weeks. Further recommendations regarding filter retrieval, continued surveillance or declaration of device permanence, will be made at that time. Marliss Coots, MD [facility] Electronically Signed   By: Marliss Coots MD   On: 08/06/2020 08:07   VAS [place] LOWER EXTREMITY VENOUS (DVT)  Result Date: 08/05/2020  Lower Venous DVT Study Indications: Edema.  Comparison Study: no prior Performing Technologist: Blanch Media RVS  Examination Guidelines: A complete evaluation includes B-mode imaging, spectral Doppler, color Doppler, and power Doppler as needed of all accessible portions of each vessel. Bilateral testing is considered an integral part of a complete examination. Limited examinations for reoccurring indications may be performed as noted. The reflux portion of the exam is performed with the patient in reverse Trendelenburg.  +---------+---------------+---------+-----------+----------+-----------------+ RIGHT    CompressibilityPhasicitySpontaneityPropertiesThrombus Aging    +---------+---------------+---------+-----------+----------+-----------------+ CFV      None           No       No                   Age Indeterminate +---------+---------------+---------+-----------+----------+-----------------+ SFJ      None                                         Age Indeterminate +---------+---------------+---------+-----------+----------+-----------------+ FV Prox  None                                         Age Indeterminate +---------+---------------+---------+-----------+----------+-----------------+ FV Mid   None                                         Age Indeterminate  +---------+---------------+---------+-----------+----------+-----------------+ FV DistalNone                                         Age Indeterminate +---------+---------------+---------+-----------+----------+-----------------+ PFV      None                                         Age Indeterminate +---------+---------------+---------+-----------+----------+-----------------+ POP      None  No       No                   Age Indeterminate +---------+---------------+---------+-----------+----------+-----------------+ PTV      None                                         Age Indeterminate +---------+---------------+---------+-----------+----------+-----------------+ PERO     None                                         Age Indeterminate +---------+---------------+---------+-----------+----------+-----------------+ EIV                     No       No                   Age Indeterminate +---------+---------------+---------+-----------+----------+-----------------+   +---------+---------------+---------+-----------+----------+--------------+ LEFT     CompressibilityPhasicitySpontaneityPropertiesThrombus Aging +---------+---------------+---------+-----------+----------+--------------+ CFV      Full           Yes      Yes                                 +---------+---------------+---------+-----------+----------+--------------+ SFJ      Full                                                        +---------+---------------+---------+-----------+----------+--------------+ FV Prox  Full                                                        +---------+---------------+---------+-----------+----------+--------------+ FV Mid   Full                                                        +---------+---------------+---------+-----------+----------+--------------+ FV DistalFull                                                         +---------+---------------+---------+-----------+----------+--------------+ PFV      Full                                                        +---------+---------------+---------+-----------+----------+--------------+ POP      Full           Yes      Yes                                 +---------+---------------+---------+-----------+----------+--------------+  PTV      Full                                                        +---------+---------------+---------+-----------+----------+--------------+ PERO     Full                                                        +---------+---------------+---------+-----------+----------+--------------+     Summary: RIGHT: - Findings consistent with age indeterminate deep vein thrombosis involving the right common femoral vein, SF junction, right femoral vein, right proximal profunda vein, right popliteal vein, right peroneal veins, and right gastrocnemius veins. - EIV appears to be occluded. further testing may be warranted.   *See table(s) above for measurements and observations. Electronically signed by Heath Lark on 08/05/2020 at 2:55:43 PM.    Final      Medications:     Scheduled Medications: . allopurinol  300 mg Oral Daily  . aspirin  81 mg Oral Daily  . atorvastatin  20 mg Oral Daily  . Chlorhexidine Gluconate Cloth  6 each Topical Daily  . clopidogrel  75 mg Oral Daily  . doxycycline  100 mg Oral Q12H  . fluticasone  1 spray Each Nare Daily  . guaiFENesin  600 mg Oral BID  . levothyroxine  75 mcg Oral Q0600  . loratadine  10 mg Oral QPM  . mometasone-formoterol  2 puff Inhalation BID  . montelukast  10 mg Oral QHS  . pantoprazole  40 mg Oral Daily  . polyethylene glycol  17 g Oral Daily  . sodium chloride flush  10-40 mL Intracatheter Q12H  . sodium chloride flush  3 mL Intravenous Q12H    Infusions: . sodium chloride 20 mL/hr at 07/28/20 1205  . sodium chloride    . heparin 1,900 Units/hr (08/06/20  0803)  . milrinone 0.25 mcg/kg/min (08/06/20 0500)    PRN Medications: sodium chloride, acetaminophen, alum & mag hydroxide-simeth, melatonin, morphine injection, ondansetron (ZOFRAN) IV, oxyCODONE, traZODone   Assessment/Plan:   1. Acute on chronic systolic HF -> cardiogenic shock - echo 3/19 EF 50-55% - echo 10/21 EF < 20% RV moderately reduced - echo 11/21 EF < 10-15 % RV moderately reduced mild to moderate MR - developed post MI shock with increasing lactate and AKI - Suspect initial decrease in EF from 3/19 -> 10/21 was nonischemic in nature (?PVCs) but no situation c/b large anterior infract with LIMA occlusion from cath films.  - No ARB/ARNI with AKI.  - No b-blocker yet with shock  - Milrinone started 11/21 and stopped 11/24, restarted 11/26 with low co-ox.  - Continue milrinone 0.25, co-ox 64% today. I worry he may be inotrope dependent. He is not VAD candidate due to age, fraility and CKD. Consult palliative care to assess GOC. May need Hospice.  - CVP 7 with rising creatinine, hold diuretics today.  BP stable.  2. RLE DVT - On heparin gtt.  - IVC filter 11/26.  - If no further invasive procedure planned, will eventually transition to Eliquis for treatment of DVT.   3. CAD with acute anterior infarct due to thrombotic LIMA occlusion - Continue DAPT, statin -  no B-blocker with shock - no chest pain.   4. AKI on CKD 3b - baseline creatinine 1.6-1.87. - due to ATN/shock - Up from 2.05 -> 2.34 -> 3.2 -> 3.4.  Milrinone started back and diuretics held, hopefully will improve.  5. Thombocytopenia -resolved  6. Cough -hx of persistent cough outpt, had PFTs - CT chest 11/4 showed interstitial pulmonary edema w/ small b/l pleural effusion; interstitial lung disease could not be evaluated; mild paraseptal emphysema - on doxy for possible bronchitis  7. Insomnia - continue trazodone  8. Leukocyctosis - Afebrile. - on doxy for possible bronchitis. ? Related to DVT -  follow   Marca Anconaalton Daaiel Starlin MD 08/06/2020, 10:25 AM  Advanced Heart Failure Team Pager [phone] (M-F; 7a - 4p)  Please contact [facility] for night-coverage after hours (4p -7a ) and weekends on [email or web address]

## 2020-08-07 DIAGNOSIS — I5023 Acute on chronic systolic (congestive) heart failure: Secondary | ICD-10-CM | POA: Diagnosis not present

## 2020-08-07 LAB — CBC
HCT: 42 % (ref 39.0–52.0)
Hemoglobin: 13.7 g/dL (ref 13.0–17.0)
MCH: 29.6 pg (ref 26.0–34.0)
MCHC: 32.6 g/dL (ref 30.0–36.0)
MCV: 90.7 fL (ref 80.0–100.0)
Platelets: 171 10*3/uL (ref 150–400)
RBC: 4.63 MIL/uL (ref 4.22–5.81)
RDW: 14.3 % (ref 11.5–15.5)
WBC: 18.1 10*3/uL — ABNORMAL HIGH (ref 4.0–10.5)
nRBC: 0.1 % (ref 0.0–0.2)

## 2020-08-07 LAB — COOXEMETRY PANEL
Carboxyhemoglobin: 1 % (ref 0.5–1.5)
Methemoglobin: 0.8 % (ref 0.0–1.5)
O2 Saturation: 64.1 %
Total hemoglobin: 14.4 g/dL (ref 12.0–16.0)

## 2020-08-07 LAB — HEPARIN LEVEL (UNFRACTIONATED)
Heparin Unfractionated: 1.08 IU/mL — ABNORMAL HIGH (ref 0.30–0.70)
Heparin Unfractionated: 0.72 IU/mL — ABNORMAL HIGH (ref 0.30–0.70)

## 2020-08-07 LAB — BASIC METABOLIC PANEL
Anion gap: 12 (ref 5–15)
BUN: 90 mg/dL — ABNORMAL HIGH (ref 8–23)
CO2: 27 mmol/L (ref 22–32)
Calcium: 9 mg/dL (ref 8.9–10.3)
Chloride: 95 mmol/L — ABNORMAL LOW (ref 98–111)
Creatinine, Ser: 2.92 mg/dL — ABNORMAL HIGH (ref 0.61–1.24)
GFR, Estimated: 21 mL/min — ABNORMAL LOW (ref >60)
Glucose, Bld: 134 mg/dL — ABNORMAL HIGH (ref 70–99)
Potassium: 4.4 mmol/L (ref 3.5–5.1)
Sodium: 134 mmol/L — ABNORMAL LOW (ref 135–145)

## 2020-08-07 NOTE — Progress Notes (Signed)
Patient ID: Kevin Flores, male   DOB: 08/08/1941, 79 y.o.   MRN: [id number]    Advanced Heart Failure Rounding Note   Subjective:    Started on milrinone 11/19 for low output HF/shock. Milrinone stopped 11/25.  Co-ox dropped and creatinine rose, milrinone restarted 11/26.  Currently on milrinone 0.25 with co-ox up to 64% today.  CVP 6, he is off diuretics with AKI.   Creatinine 2.0 -> 2.3 -> 3.2 -> 3.4 -> 2.92.   Extensive right leg DVT diagnosed 11/26 with unilateral swelling.  Now on heparin gtt and IVC filter was placed 11/26.   Feels better today. No dyspnea at rest. Right leg feels better.   Objective:   Weight Range:  Vital Signs:   Temp:  [97.5 F (36.4 C)-98.2 F (36.8 C)] 98.2 F (36.8 C) (11/28 0746) Pulse Rate:  [98-102] 99 (11/28 0846) Resp:  [10-22] 18 (11/28 0846) BP: (110-114)/(75-85) 110/75 (11/28 0746) SpO2:  [95 %-99 %] 95 % (11/28 0846) FiO2 (%):  [32 %] 32 % (11/28 0733) Weight:  [82.7 kg] 82.7 kg (11/28 0300) Last BM Date: 08/04/20  Weight change: Filed Weights   08/05/20 0307 08/06/20 0643 08/07/20 0300  Weight: 82.6 kg 80.8 kg 82.7 kg    Intake/Output:   Intake/Output Summary (Last 24 hours) at 08/07/2020 1108 Last data filed at 08/07/2020 0953 Gross per 24 hour  Intake 827.23 ml  Output 1200 ml  Net -372.77 ml    General: NAD Neck: No JVD, no thyromegaly or thyroid nodule.  Lungs: Clear to auscultation bilaterally with normal respiratory effort. CV: Nondisplaced PMI.  Heart regular S1/S2, no S3/S4, no murmur.  1+ edema to knee on right (improved).   Abdomen: Soft, nontender, no hepatosplenomegaly, no distention.  Skin: Intact without lesions or rashes.  Neurologic: Alert and oriented x 3.  Psych: Normal affect. Extremities: No clubbing or cyanosis.  HEENT: Normal.   Telemetry: NSR 90s-100s with rare PVCs Personally reviewed  Labs: Basic Metabolic Panel: Recent Labs  Lab 08/02/20 0430 08/02/20 0430 08/03/20 0436 08/03/20 0436  08/03/20 0809 08/04/20 0351 08/04/20 0351 08/05/20 0500 08/06/20 0349 08/07/20 0325  NA 136   < > 137  --   --  137  --  138 136 134*  K 4.2   < > 3.6  --   --  4.3  --  4.1 4.2 4.4  CL 95*   < > 96*  --   --  96*  --  95* 94* 95*  CO2 26   < > 30  --   --  27  --  22 26 27   GLUCOSE 112*   < > 155*  --   --  137*  --  140* 139* 134*  BUN 39*   < > 43*  --   --  51*  --  76* 95* 90*  CREATININE 2.09*   < > 2.05*  --   --  2.34*  --  3.20* 3.42* 2.92*  CALCIUM 9.3   < > 9.3   < >  --  9.5   < > 9.8 9.1 9.0  MG 2.2  --   --   --  2.4  --   --   --   --   --    < > = values in this interval not displayed.    Liver Function Tests: No results for input(s): AST, ALT, ALKPHOS, BILITOT, PROT, ALBUMIN in the last 168 hours. No results for input(s): LIPASE,  AMYLASE in the last 168 hours. No results for input(s): AMMONIA in the last 168 hours.  CBC: Recent Labs  Lab 08/03/20 0436 08/04/20 0351 08/05/20 0500 08/06/20 0349 08/07/20 0325  WBC 9.4 10.6* 13.3* 18.4* 18.1*  HGB 14.0 14.6 15.3 14.0 13.7  HCT 43.0 45.4 47.1 43.2 42.0  MCV 92.5 91.3 92.2 91.3 90.7  PLT 172 188 169 167 171    Cardiac Enzymes: No results for input(s): CKTOTAL, CKMB, CKMBINDEX, TROPONINI in the last 168 hours.  BNP: BNP (last 3 results) Recent Labs    06/09/20 1012 07/29/20 1643  BNP 774.8* 3,099.8*    ProBNP (last 3 results) No results for input(s): PROBNP in the last 8760 hours.    Other results:  Imaging: IR IVC FILTER PLMT / S&I Lenise Arena GUID/MOD SED  Result Date: 08/06/2020 INDICATION: 79 year old male with acute on chronic congestive heart failure and cardiogenic shock with extensive right lower extremity DVT. EXAM: ULTRASOUND GUIDANCE FOR VASCULAR ACCESS IVC CATHETERIZATION AND VENOGRAM IVC FILTER INSERTION COMPARISON:  None. MEDICATIONS: None. ANESTHESIA/SEDATION: Fentanyl 25 mcg IV; Versed 1 mg IV Sedation Time: 11 minutes; The patient was continuously monitored during the procedure by the  interventional radiology nurse under my direct supervision. CONTRAST:  50 mL CO2 FLUOROSCOPY TIME:  One minutes 42 seconds (46 mGy) COMPLICATIONS: None immediate PROCEDURE: Informed consent was obtained from the patient following explanation of the procedure, risks, benefits and alternatives. The patient understands, agrees and consents for the procedure. All questions were addressed. A time out was performed prior to the initiation of the procedure. Maximal barrier sterile technique utilized including caps, mask, sterile gowns, sterile gloves, large sterile drape, hand hygiene, and Betadine prep. Under sterile condition and local anesthesia, right internal jugular venous access was performed with ultrasound. An ultrasound image was saved and sent to PACS. Over a guidewire, the IVC filter delivery sheath and inner dilator were advanced into the IVC just above the IVC bifurcation. Contrast injection was performed for an IVC venogram. Through the delivery sheath, a retrievable Denali IVC filter was deployed below the level of the renal veins and above the IVC bifurcation. Limited post deployment venacavagram was performed. The delivery sheath was removed and hemostasis was obtained with manual compression. A dressing was placed. The patient tolerated the procedure well without immediate post procedural complication. FINDINGS: The IVC is patent. No evidence of thrombus, stenosis, or occlusion. No variant venous anatomy. Successful placement of the IVC filter below the level of the renal veins. IMPRESSION: Successful ultrasound and fluoroscopically guided placement of an infrarenal retrievable IVC filter via right jugular approach. PLAN: This IVC filter is potentially retrievable. The patient will be assessed for filter retrieval by Interventional Radiology in approximately 8-12 weeks. Further recommendations regarding filter retrieval, continued surveillance or declaration of device permanence, will be made at that  time. Marliss Coots, MD [facility] Radiology Electronically Signed   By: Marliss Coots MD   On: 08/06/2020 08:07   IR US Guide Vasc Access Right  Result Date: 08/06/2020 INDICATION: 79 year old male with acute on chronic congestive heart failure and cardiogenic shock with extensive right lower extremity DVT. EXAM: ULTRASOUND GUIDANCE FOR VASCULAR ACCESS IVC CATHETERIZATION AND VENOGRAM IVC FILTER INSERTION COMPARISON:  None. MEDICATIONS: None. ANESTHESIA/SEDATION: Fentanyl 25 mcg IV; Versed 1 mg IV Sedation Time: 11 minutes; The patient was continuously monitored during the procedure by the interventional radiology nurse under my direct supervision. CONTRAST:  50 mL CO2 FLUOROSCOPY TIME:  One minutes 42 seconds (46  mGy) COMPLICATIONS: None immediate PROCEDURE: Informed consent was obtained from the patient following explanation of the procedure, risks, benefits and alternatives. The patient understands, agrees and consents for the procedure. All questions were addressed. A time out was performed prior to the initiation of the procedure. Maximal barrier sterile technique utilized including caps, mask, sterile gowns, sterile gloves, large sterile drape, hand hygiene, and Betadine prep. Under sterile condition and local anesthesia, right internal jugular venous access was performed with ultrasound. An ultrasound image was saved and sent to PACS. Over a guidewire, the IVC filter delivery sheath and inner dilator were advanced into the IVC just above the IVC bifurcation. Contrast injection was performed for an IVC venogram. Through the delivery sheath, a retrievable Denali IVC filter was deployed below the level of the renal veins and above the IVC bifurcation. Limited post deployment venacavagram was performed. The delivery sheath was removed and hemostasis was obtained with manual compression. A dressing was placed. The patient tolerated the procedure well without  immediate post procedural complication. FINDINGS: The IVC is patent. No evidence of thrombus, stenosis, or occlusion. No variant venous anatomy. Successful placement of the IVC filter below the level of the renal veins. IMPRESSION: Successful ultrasound and fluoroscopically guided placement of an infrarenal retrievable IVC filter via right jugular approach. PLAN: This IVC filter is potentially retrievable. The patient will be assessed for filter retrieval by Interventional Radiology in approximately 8-12 weeks. Further recommendations regarding filter retrieval, continued surveillance or declaration of device permanence, will be made at that time. Marliss Coots, MD [facility]  [facility] Radiology Electronically Signed   By: Marliss Coots MD   On: 08/06/2020 08:07   VAS [place] LOWER EXTREMITY VENOUS (DVT)  Result Date: 08/05/2020  Lower Venous DVT Study Indications: Edema.  Comparison Study: no prior Performing Technologist: Blanch Media RVS  Examination Guidelines: A complete evaluation includes B-mode imaging, spectral Doppler, color Doppler, and power Doppler as needed of all accessible portions of each vessel. Bilateral testing is considered an integral part of a complete examination. Limited examinations for reoccurring indications may be performed as noted. The reflux portion of the exam is performed with the patient in reverse Trendelenburg.  +---------+---------------+---------+-----------+----------+-----------------+ RIGHT    CompressibilityPhasicitySpontaneityPropertiesThrombus Aging    +---------+---------------+---------+-----------+----------+-----------------+ CFV      None           No       No                   Age Indeterminate +---------+---------------+---------+-----------+----------+-----------------+ SFJ      None                                         Age Indeterminate  +---------+---------------+---------+-----------+----------+-----------------+ FV Prox  None                                         Age Indeterminate +---------+---------------+---------+-----------+----------+-----------------+ FV Mid   None                                         Age Indeterminate +---------+---------------+---------+-----------+----------+-----------------+ FV DistalNone  Age Indeterminate +---------+---------------+---------+-----------+----------+-----------------+ PFV      None                                         Age Indeterminate +---------+---------------+---------+-----------+----------+-----------------+ POP      None           No       No                   Age Indeterminate +---------+---------------+---------+-----------+----------+-----------------+ PTV      None                                         Age Indeterminate +---------+---------------+---------+-----------+----------+-----------------+ PERO     None                                         Age Indeterminate +---------+---------------+---------+-----------+----------+-----------------+ EIV                     No       No                   Age Indeterminate +---------+---------------+---------+-----------+----------+-----------------+   +---------+---------------+---------+-----------+----------+--------------+ LEFT     CompressibilityPhasicitySpontaneityPropertiesThrombus Aging +---------+---------------+---------+-----------+----------+--------------+ CFV      Full           Yes      Yes                                 +---------+---------------+---------+-----------+----------+--------------+ SFJ      Full                                                        +---------+---------------+---------+-----------+----------+--------------+ FV Prox  Full                                                         +---------+---------------+---------+-----------+----------+--------------+ FV Mid   Full                                                        +---------+---------------+---------+-----------+----------+--------------+ FV DistalFull                                                        +---------+---------------+---------+-----------+----------+--------------+ PFV      Full                                                        +---------+---------------+---------+-----------+----------+--------------+  POP      Full           Yes      Yes                                 +---------+---------------+---------+-----------+----------+--------------+ PTV      Full                                                        +---------+---------------+---------+-----------+----------+--------------+ PERO     Full                                                        +---------+---------------+---------+-----------+----------+--------------+     Summary: RIGHT: - Findings consistent with age indeterminate deep vein thrombosis involving the right common femoral vein, SF junction, right femoral vein, right proximal profunda vein, right popliteal vein, right peroneal veins, and right gastrocnemius veins. - EIV appears to be occluded. further testing may be warranted.   *See table(s) above for measurements and observations. Electronically signed by Heath Lark on 08/05/2020 at 2:55:43 PM.    Final      Medications:     Scheduled Medications: . allopurinol  300 mg Oral Daily  . aspirin  81 mg Oral Daily  . atorvastatin  20 mg Oral Daily  . Chlorhexidine Gluconate Cloth  6 each Topical Daily  . clopidogrel  75 mg Oral Daily  . doxycycline  100 mg Oral Q12H  . fluticasone  1 spray Each Nare Daily  . guaiFENesin  600 mg Oral BID  . levothyroxine  75 mcg Oral Q0600  . loratadine  10 mg Oral QPM  . mometasone-formoterol  2 puff Inhalation BID  . montelukast  10 mg Oral  QHS  . pantoprazole  40 mg Oral Daily  . polyethylene glycol  17 g Oral Daily  . sodium chloride flush  10-40 mL Intracatheter Q12H  . sodium chloride flush  3 mL Intravenous Q12H    Infusions: . sodium chloride 20 mL/hr at 07/28/20 1205  . sodium chloride    . heparin 1,400 Units/hr (08/07/20 0922)  . milrinone 0.25 mcg/kg/min (08/07/20 0800)    PRN Medications: sodium chloride, acetaminophen, alum & mag hydroxide-simeth, melatonin, morphine injection, ondansetron (ZOFRAN) IV, oxyCODONE, traZODone   Assessment/Plan:   1. Acute on chronic systolic HF -> cardiogenic shock - echo 3/19 EF 50-55% - echo 10/21 EF < 20% RV moderately reduced - echo 11/21 EF < 10-15 % RV moderately reduced mild to moderate MR - developed post MI shock with increasing lactate and AKI - Suspect initial decrease in EF from 3/19 -> 10/21 was nonischemic in nature (?PVCs) but no situation c/b large anterior infract with LIMA occlusion from cath films.  - No ARB/ARNI with AKI.  - No b-blocker yet with shock  - Milrinone started 11/21 and stopped 11/24, restarted 11/26 with low co-ox.  - Continue milrinone 0.25, co-ox 64% today. I think he is going to be inotrope dependent. He is not VAD candidate due to age, fraility and CKD. Consult palliative care to assess GOC. May need Hospice.  -  CVP 6, creatinine has stabilized.  Hold diuretics today.  2. RLE DVT - On heparin gtt, leg improving.  - IVC filter 11/26.  - If no further invasive procedure planned, will eventually transition to Eliquis for treatment of DVT.   3. CAD with acute anterior infarct due to thrombotic LIMA occlusion - Continue DAPT, statin - no B-blocker with shock - no chest pain.   4. AKI on CKD 3b - baseline creatinine 1.6-1.87. - due to ATN/shock - Creatinine up to 3.4, milrinone started back and diuretics held, now creatinine improving (2.9 today).  5. Thombocytopenia -resolved  6. Cough -hx of persistent cough outpt, had  PFTs - CT chest 11/4 showed interstitial pulmonary edema w/ small b/l pleural effusion; interstitial lung disease could not be evaluated; mild paraseptal emphysema - on doxy for possible bronchitis - Off oxygen this morning.   7. Insomnia - continue trazodone  8. Leukocyctosis - Afebrile. - on doxy for possible bronchitis. ? Related to DVT - follow   Mobilize today.   Marca Ancona MD 08/07/2020, 11:08 AM  Advanced Heart Failure Team Pager [phone] (M-F; 7a - 4p)  Please contact [facility] for night-coverage after hours (4p -7a ) and weekends on [email or web address]

## 2020-08-07 NOTE — Progress Notes (Signed)
ANTICOAGULATION CONSULT NOTE - Follow Up Consult  Pharmacy Consult for heparin Indication: DVT  Labs: Recent Labs    08/05/20 0500 08/05/20 1900 08/06/20 0349 08/06/20 0621 08/06/20 1426 08/07/20 0325  HGB 15.3  --  14.0  --   --  13.7  HCT 47.1  --  43.2  --   --  42.0  PLT 169  --  167  --   --  171  HEPARINUNFRC  --    < >  --  <0.10* 1.07* 1.08*  CREATININE 3.20*  --  3.42*  --   --   --    < > = values in this interval not displayed.    Assessment: 79yo male remains supratherapeutic on heparin despite decreased rate; lab should now be accurate since heparin infusion was moved to PIV and labs drawn from CVC; no gtt issues or signs of bleeding per RN.  Goal of Therapy:  Heparin level 0.3-0.7 units/ml   Plan:  Will decrease heparin gtt by 3 units/kg/hr to 1400 units/hr and check level in 8 hours.    Vernard Gambles, PharmD, BCPS  08/07/2020,5:10 AM

## 2020-08-07 NOTE — Progress Notes (Signed)
ANTICOAGULATION CONSULT NOTE  Pharmacy Consult for Heparin Indication: DVT  Allergies  Allergen Reactions   Imdur [Isosorbide Dinitrate] Other (See Comments)    Headache, "made my head swim"   Rosuvastatin Other (See Comments)    SEVERE muscle and leg cramps/aches   Sulfonamide Derivatives Rash and Other (See Comments)    Also "made the whites of my eyes turn yellow"    Patient Measurements: Height: 5\' 9"  (175.3 cm) Weight: 82.7 kg (182 lb 5.1 oz) IBW/kg (Calculated) : 70.7  Heparin dosing weight: 82.6 kg  Vital Signs: Temp: 98.4 F (36.9 C) (11/28 1147) Temp Source: Oral (11/28 1147) BP: 118/90 (11/28 1147) Pulse Rate: 99 (11/28 0846)  Labs: Recent Labs    08/05/20 0500 08/05/20 1900 08/06/20 0349 08/06/20 0621 08/06/20 1426 08/07/20 0325 08/07/20 1356  HGB 15.3  --  14.0  --   --  13.7  --   HCT 47.1  --  43.2  --   --  42.0  --   PLT 169  --  167  --   --  171  --   HEPARINUNFRC  --    < >  --    < > 1.07* 1.08* 0.72*  CREATININE 3.20*  --  3.42*  --   --  2.92*  --    < > = values in this interval not displayed.    Estimated Creatinine Clearance: 21.2 mL/min (A) (by C-G formula based on SCr of 2.92 mg/dL (H)).   Assessment: 79 YOM on DAPT with noted leg swelling on exam and Doppler confirming extensive DVT.  Pharmacy has been consulted for heparin dosing. Patient is s/p IVC filter placement on 11/26.    Heparin level trending down but still slightly supratherapeutic after decreasing heparin to 1400 units/hr this morning. No bleeding or infusion issues per discussion with nursing. CBC wnl. Renal function improving with Scr 3.42>2.9.   Goal of Therapy:  Heparin level 0.3-0.7 units/ml Monitor platelets by anticoagulation protocol: Yes   Plan:  Decrease heparin infusion to 1300 units/hr Check next HL with AM labs Monitor daily HL, CBC, s/sx bleeding  12/26, PharmD, BCPS PGY2 Cardiology Pharmacy Resident Phone: [phone] 08/07/2020  2:56  PM  Please check [email or web address] for unit-specific pharmacy phone numbers.

## 2020-08-07 NOTE — Progress Notes (Signed)
Patient had 6 beats of VT. Patient was stable and asymptomatic. Cardiac team PA [facility] made aware of this matter and continue to monitor. HS [employer]

## 2020-08-07 NOTE — Progress Notes (Signed)
Patient had 9 beats run of SVTs. Patient asymptomatic, resting well. Patient did not complain about anything. Vitals taken. BP: 107/79 (88), HR 100. MD made aware. No new orders received. Will continue to monitor.

## 2020-08-08 ENCOUNTER — Other Ambulatory Visit: Payer: Self-pay | Admitting: Radiology

## 2020-08-08 ENCOUNTER — Encounter (HOSPITAL_COMMUNITY): Payer: Self-pay

## 2020-08-08 DIAGNOSIS — I213 ST elevation (STEMI) myocardial infarction of unspecified site: Secondary | ICD-10-CM | POA: Diagnosis not present

## 2020-08-08 DIAGNOSIS — I2102 ST elevation (STEMI) myocardial infarction involving left anterior descending coronary artery: Secondary | ICD-10-CM | POA: Diagnosis not present

## 2020-08-08 DIAGNOSIS — Z515 Encounter for palliative care: Secondary | ICD-10-CM

## 2020-08-08 DIAGNOSIS — I82411 Acute embolism and thrombosis of right femoral vein: Secondary | ICD-10-CM

## 2020-08-08 DIAGNOSIS — Z7189 Other specified counseling: Secondary | ICD-10-CM

## 2020-08-08 DIAGNOSIS — I82401 Acute embolism and thrombosis of unspecified deep veins of right lower extremity: Secondary | ICD-10-CM

## 2020-08-08 DIAGNOSIS — N1832 Chronic kidney disease, stage 3b: Secondary | ICD-10-CM | POA: Diagnosis not present

## 2020-08-08 DIAGNOSIS — I5023 Acute on chronic systolic (congestive) heart failure: Secondary | ICD-10-CM | POA: Diagnosis not present

## 2020-08-08 LAB — CBC
HCT: 40.9 % (ref 39.0–52.0)
Hemoglobin: 13.4 g/dL (ref 13.0–17.0)
MCH: 29.5 pg (ref 26.0–34.0)
MCHC: 32.8 g/dL (ref 30.0–36.0)
MCV: 90.1 fL (ref 80.0–100.0)
Platelets: 196 10*3/uL (ref 150–400)
RBC: 4.54 MIL/uL (ref 4.22–5.81)
RDW: 14.4 % (ref 11.5–15.5)
WBC: 15.5 10*3/uL — ABNORMAL HIGH (ref 4.0–10.5)
nRBC: 0.1 % (ref 0.0–0.2)

## 2020-08-08 LAB — BASIC METABOLIC PANEL
Anion gap: 13 (ref 5–15)
BUN: 79 mg/dL — ABNORMAL HIGH (ref 8–23)
CO2: 26 mmol/L (ref 22–32)
Calcium: 8.9 mg/dL (ref 8.9–10.3)
Chloride: 95 mmol/L — ABNORMAL LOW (ref 98–111)
Creatinine, Ser: 2.42 mg/dL — ABNORMAL HIGH (ref 0.61–1.24)
GFR, Estimated: 27 mL/min — ABNORMAL LOW (ref >60)
Glucose, Bld: 127 mg/dL — ABNORMAL HIGH (ref 70–99)
Potassium: 4.3 mmol/L (ref 3.5–5.1)
Sodium: 134 mmol/L — ABNORMAL LOW (ref 135–145)

## 2020-08-08 LAB — HEPARIN LEVEL (UNFRACTIONATED): Heparin Unfractionated: 0.67 IU/mL (ref 0.30–0.70)

## 2020-08-08 LAB — COOXEMETRY PANEL
Carboxyhemoglobin: 1.4 % (ref 0.5–1.5)
Methemoglobin: 0.9 % (ref 0.0–1.5)
O2 Saturation: 68.1 %
Total hemoglobin: 13.9 g/dL (ref 12.0–16.0)

## 2020-08-08 LAB — MAGNESIUM: Magnesium: 2.6 mg/dL — ABNORMAL HIGH (ref 1.7–2.4)

## 2020-08-08 MED ORDER — OXYMETAZOLINE HCL 0.05 % NA SOLN
1.0000 | Freq: Two times a day (BID) | NASAL | Status: AC
  Administered 2020-08-08 – 2020-08-10 (×6): 1 via NASAL
  Filled 2020-08-08: qty 30

## 2020-08-08 MED ORDER — DOCUSATE SODIUM 100 MG PO CAPS
100.0000 mg | ORAL_CAPSULE | Freq: Two times a day (BID) | ORAL | Status: DC
  Administered 2020-08-08 – 2020-08-10 (×3): 100 mg via ORAL
  Filled 2020-08-08 (×6): qty 1

## 2020-08-08 NOTE — TOC Progression Note (Deleted)
Transition of Care [facility]) - Progression Note    Patient Details  Name: Devlon Dosher MRN: [id number] Date of Birth: Oct 13, 1940  [facility] CM/SW Contact  Leone Haven, RN Phone Number: 08/08/2020, 12:59 PM  Clinical Narrative:    NCM spoke with patient and wife at bedside, he will need home milrinone.  NCM offered choice, they state , they do not have a preference.  NCM made referral to Jeri Modena for the medication and teaching and made referral Ken[facility] with [facility].  Patient states he will have transportation to go home at discharge.  He has a lift chair at home.     Barriers to Discharge: Continued Medical Work up  Expected Discharge Plan and Services                             DME Agency: NA       HH Arranged: RN HH Agency: [facility] Date HH Agency Contacted: 08/08/20 Time HH Agency Contacted: [phone] Representative spoke with at [facility] Agency: [facility] Grippe   Social Determinants of Health (SDOH) Interventions    Readmission Risk Interventions No flowsheet data found.

## 2020-08-08 NOTE — Progress Notes (Addendum)
Patient ID: Kevin Flores, male   DOB: Jan 17, 1941, 79 y.o.   MRN: [id number]    Advanced Heart Failure Rounding Note   Subjective:    Started on milrinone 11/19 for low output HF/shock. Milrinone stopped 11/25.  Co-ox dropped and creatinine rose, milrinone restarted 11/26.  Placed on heparin gtt and IVC filter placed on 11/26.   Currently on milrinone 0.25 with co-ox up to 68% today.  CVP 3.  Continues to have swelling to RLE, reports decrease pain but remains swollen.  Also notes issues with nosebleed started last night, trying flonase w/o relief. Not currently on oxygen.       Objective:   Weight Range:  Vital Signs:   Temp:  [98.2 F (36.8 C)-98.8 F (37.1 C)] 98.5 F (36.9 C) (11/29 0356) Pulse Rate:  [94-102] 102 (11/29 0356) Resp:  [11-25] 13 (11/29 0356) BP: (105-118)/(74-90) 107/74 (11/29 0356) SpO2:  [92 %-99 %] 94 % (11/29 0722) FiO2 (%):  [32 %] 32 % (11/28 0733) Weight:  [84.8 kg] 84.8 kg (11/29 0356) Last BM Date: 08/04/20  Weight change: Filed Weights   08/06/20 0643 08/07/20 0300 08/08/20 0356  Weight: 80.8 kg 82.7 kg 84.8 kg    Intake/Output:   Intake/Output Summary (Last 24 hours) at 08/08/2020 0724 Last data filed at 08/08/2020 0528 Gross per 24 hour  Intake 1358.66 ml  Output 1250 ml  Net 108.66 ml   CVP 3  General:  Well appearing. No resp difficulty,  HEENT: Dry blood noted.  Neck: Supple. Carotids 2+ bilat; no bruits.   Cor: PMI nondisplaced. Regular rate & rhythm. No rubs, gallops or murmurs. Lungs: Clear bilaterally.  Abdomen: soft, nontender, nondistended.  Good bowel sounds. Extremities: no cyanosis, clubbing, rash, LLE edema.  RLE swollen from foot to thigh.  No discoloration noted from ankle up.  R foot discoloration.   Neuro: alert & oriented x 3, cranial nerves grossly intact. moves all 4 extremities w/o difficulty. Affect pleasant   Telemetry: SR w/ occasional PVCs. Rates 90-100s. Did have NSVT x 6 beats last PM.  Personally  reviewed.    Labs: Basic Metabolic Panel: Recent Labs  Lab 08/02/20 0430 08/03/20 0436 08/03/20 0809 08/04/20 0351 08/04/20 0351 08/05/20 0500 08/05/20 0500 08/06/20 0349 08/07/20 0325 08/08/20 0221  NA 136   < >  --  137  --  138  --  136 134* 134*  K 4.2   < >  --  4.3  --  4.1  --  4.2 4.4 4.3  CL 95*   < >  --  96*  --  95*  --  94* 95* 95*  CO2 26   < >  --  27  --  22  --  26 27 26   GLUCOSE 112*   < >  --  137*  --  140*  --  139* 134* 127*  BUN 39*   < >  --  51*  --  76*  --  95* 90* 79*  CREATININE 2.09*   < >  --  2.34*  --  3.20*  --  3.42* 2.92* 2.42*  CALCIUM 9.3   < >  --  9.5   < > 9.8   < > 9.1 9.0 8.9  MG 2.2  --  2.4  --   --   --   --   --   --   --    < > = values in this interval not displayed.  Liver Function Tests: No results for input(s): AST, ALT, ALKPHOS, BILITOT, PROT, ALBUMIN in the last 168 hours. No results for input(s): LIPASE, AMYLASE in the last 168 hours. No results for input(s): AMMONIA in the last 168 hours.  CBC: Recent Labs  Lab 08/04/20 0351 08/05/20 0500 08/06/20 0349 08/07/20 0325 08/08/20 0221  WBC 10.6* 13.3* 18.4* 18.1* 15.5*  HGB 14.6 15.3 14.0 13.7 13.4  HCT 45.4 47.1 43.2 42.0 40.9  MCV 91.3 92.2 91.3 90.7 90.1  PLT 188 169 167 171 196    Cardiac Enzymes: No results for input(s): CKTOTAL, CKMB, CKMBINDEX, TROPONINI in the last 168 hours.  BNP: BNP (last 3 results) Recent Labs    06/09/20 1012 07/29/20 1643  BNP 774.8* 3,099.8*    ProBNP (last 3 results) No results for input(s): PROBNP in the last 8760 hours.    Other results:  Imaging: No results found.   Medications:     Scheduled Medications: . allopurinol  300 mg Oral Daily  . aspirin  81 mg Oral Daily  . atorvastatin  20 mg Oral Daily  . Chlorhexidine Gluconate Cloth  6 each Topical Daily  . clopidogrel  75 mg Oral Daily  . fluticasone  1 spray Each Nare Daily  . guaiFENesin  600 mg Oral BID  . levothyroxine  75 mcg Oral Q0600  .  loratadine  10 mg Oral QPM  . mometasone-formoterol  2 puff Inhalation BID  . montelukast  10 mg Oral QHS  . pantoprazole  40 mg Oral Daily  . polyethylene glycol  17 g Oral Daily  . sodium chloride flush  10-40 mL Intracatheter Q12H  . sodium chloride flush  3 mL Intravenous Q12H    Infusions: . sodium chloride 20 mL/hr at 07/28/20 1205  . sodium chloride    . heparin 1,300 Units/hr (08/08/20 0528)  . milrinone 0.25 mcg/kg/min (08/08/20 0356)    PRN Medications: sodium chloride, acetaminophen, alum & mag hydroxide-simeth, melatonin, morphine injection, ondansetron (ZOFRAN) IV, oxyCODONE, traZODone   Assessment/Plan:   1. Acute on chronic systolic HF -> cardiogenic shock - echo 3/19 EF 50-55% - echo 10/21 EF < 20% RV moderately reduced - echo 11/21 EF < 10-15 % RV moderately reduced mild to moderate MR - developed post MI shock with increasing lactate and AKI - Suspect initial decrease in EF from 3/19 -> 10/21 was nonischemic in nature (?PVCs) but no situation c/b large anterior infract with LIMA occlusion from cath films.  - Milrinone started 11/21 and stopped 11/24, restarted 11/26 with low co-ox.  - Continue milrinone 0.25, co-ox 68% today w/ CVP 3.  - No diuretics today, Cr improving.  - No ARB/ARNI with AKI.  - No b-blocker yet with shock  - He is not VAD candidate due to age, fraility and CKD. Consult palliative care to assess GOC. May need Hospice.   2. RLE DVT - On heparin gtt, reports improvement in pain but remains swollen. Overnight, developed epistaxis, will add afrin x 3 days.  Hold off on O2 via n/c, SpO2 93% on RA.  - IVC filter 11/26.  - Will have IR evaluate the RLE for possible thrombectomy. - If no further invasive procedure planned, will eventually transition to Eliquis for treatment of DVT.   3. CAD with acute anterior infarct due to thrombotic LIMA occlusion - Continue DAPT, statin - no B-blocker with shock - no chest pain.   4. AKI on CKD 3b -  baseline creatinine 1.6-1.87. - due to ATN/shock -  Creatinine up to 3.4, milrinone started back, now creatinine improving 2.4 today.  5. Thombocytopenia -resolved  6. Cough -hx of persistent cough outpt, had PFTs.  Reports improvement in cough.  - CT chest 11/4 showed interstitial pulmonary edema w/ small b/l pleural effusion; interstitial lung disease could not be evaluated; mild paraseptal emphysema - Completed 5 days of doxy for possible bronchitis.  7. Insomnia - continue trazodone  8. Leukocyctosis - Afebrile. - WBC peak 18.4 on 11/27, currently improving today WBC 15.5 - Completed 5 days of doxy for possible bronchitis.  - ? Related to DVT - Continue to monitor and trend.     Terance Ice MD 08/08/2020, 7:24 AM  Advanced Heart Failure Team Pager [phone] (M-F; 7a - 4p)  Please contact [facility] for night-coverage after hours (4p -7a ) and weekends on [email or web address]   Patient seen and examined with the above-signed Advanced Practice Provider and/or Housestaff. I personally reviewed laboratory data, imaging studies and relevant notes. I independently examined the patient and formulated the important aspects of the plan. I have edited the note to reflect any of my changes or salient points. I have personally discussed the plan with the patient and/or family.  Remains on milrinone. Co-ox and CVP improved. Renal failure improving. Remains on heparin for extensive RLE DVT. IVC filter in place. Denies SOB, orthopnea or PND. Leg feels tight. Getting depressed and wants to go home  General:  Elderly male. No resp difficulty HEENT: normal Neck: supple. no JVD. Carotids 2+ bilat; no bruits. No lymphadenopathy or thryomegaly appreciated. Cor: PMI nondisplaced. Regular rate & rhythm. No rubs, gallops or murmurs. Lungs: clear Abdomen: soft, nontender, nondistended. No hepatosplenomegaly. No bruits or masses. Good bowel sounds. Extremities: no cyanosis, clubbing, rash, 2+ edema  throughout R leg. None on left Neuro: alert & orientedx3, cranial nerves grossly intact. moves all 4 extremities w/o difficulty. Affect pleasant  From HF perspective, appears inotrope dependent. Will continue milrinone with plans for home milrinone. Will need tunneled PICC soon. RLE still tight. Will ask IR to evaluate and see if he is candidate for thrombectomy. Continue heparin.   Arvilla Meres, MD  9:32 AM

## 2020-08-08 NOTE — Progress Notes (Signed)
ANTICOAGULATION CONSULT NOTE  Pharmacy Consult for Heparin Indication: DVT  Allergies  Allergen Reactions  . Imdur [Isosorbide Dinitrate] Other (See Comments)    Headache, "made my head swim"  . Rosuvastatin Other (See Comments)    SEVERE muscle and leg cramps/aches  . Sulfonamide Derivatives Rash and Other (See Comments)    Also "made the whites of my eyes turn yellow"    Patient Measurements: Height: 5\' 9"  (175.3 cm) Weight: 84.8 kg (186 lb 15.2 oz) IBW/kg (Calculated) : 70.7  Heparin dosing weight: 82.6 kg  Vital Signs: Temp: 97.6 F (36.4 C) (11/29 0808) Temp Source: Oral (11/29 0808) BP: 122/82 (11/29 0808) Pulse Rate: 107 (11/29 0808)  Labs: Recent Labs    08/06/20 0349 08/06/20 0621 08/07/20 0325 08/07/20 1356 08/08/20 0221  HGB 14.0   < > 13.7  --  13.4  HCT 43.2  --  42.0  --  40.9  PLT 167  --  171  --  196  HEPARINUNFRC  --    < > 1.08* 0.72* 0.67  CREATININE 3.42*  --  2.92*  --  2.42*   < > = values in this interval not displayed.    Estimated Creatinine Clearance: 25.6 mL/min (A) (by C-G formula based on SCr of 2.42 mg/dL (H)).   Assessment: 79 YOM on DAPT with noted leg swelling on exam and Doppler confirming extensive DVT.  Pharmacy has been consulted for heparin dosing. Patient is s/p IVC filter placement on 11/26.    Heparin level therapeutic at 0.67, CBC stable, epistaxis noted overnight. Continuing parental anticoagulation until PICC placed.   Goal of Therapy:  Heparin level 0.3-0.7 units/ml Monitor platelets by anticoagulation protocol: Yes   Plan:  Reduce heparin slightly with epistaxis to 1250 units/h Daily heparin level and CBC   12/26, PharmD, [place], [facility] Clinical Pharmacist [phone] Please check AMION for all [facility] Pharmacy numbers 08/08/2020

## 2020-08-08 NOTE — Progress Notes (Signed)
Physical Therapy Treatment Patient Details Name: Kevin Flores MRN: [id number] DOB: 1940-09-18 Today's Date: 08/08/2020    History of Present Illness 79 y.o. male with history of coronary artery disease status post remote CABG and prior PCI procedures.  He underwent cardiac catheterization 07/27/2020 because of progressive LV systolic dysfunction.  He was found to have no targets for PCI with recommendation for ongoing medical therapy.  At 10:00 on 07/28/2020 he developed acute onset of substernal chest pain and after arrival in the emergency department his EKG evolved into an anterior STEMI. Pt underwent emergent cardiac cath on 11/18 with balloon angioplasty.    PT Comments    Pt supine in bed on arrival sleeping soundly.  Wife at bedside and woke patient for PT session.  Pt pleasant and agreeable but limited due to pain in RLE.  Pt continues to benefit from acute rehab to maximize functional gains before returning home.     Follow Up Recommendations  No PT follow up;Supervision - Intermittent     Equipment Recommendations  Other (comment)    Recommendations for Other Services       Precautions / Restrictions Precautions Precautions: Fall Precaution Comments: watch 02 Restrictions Weight Bearing Restrictions: No    Mobility  Bed Mobility Overal bed mobility: Needs Assistance Bed Mobility: Supine to Sit     Supine to sit: Supervision        Transfers Overall transfer level: Needs assistance Equipment used: Rolling walker (2 wheeled) Transfers: Sit to/from Stand Sit to Stand: Min guard         General transfer comment: Cues for hand placement as he was attempting to pull on RW to rise into standing.  Ambulation/Gait Ambulation/Gait assistance: Min guard Gait Distance (Feet): 80 Feet Assistive device: Rolling walker (2 wheeled) Gait Pattern/deviations: Step-through pattern Gait velocity: reduced   General Gait Details: Cues for posture and pacing, utilized RW  vs. rollator due to pain in RLE and need to unweight into UEs.  Pt slow and guarded due to pain.   Stairs             Wheelchair Mobility    Modified Rankin (Stroke Patients Only)       Balance Overall balance assessment: Needs assistance Sitting-balance support: No upper extremity supported;Feet supported Sitting balance-Leahy Scale: Good     Standing balance support: No upper extremity supported;Bilateral upper extremity supported Standing balance-Leahy Scale: Fair                              Cognition Arousal/Alertness: Awake/alert Behavior During Therapy: WFL for tasks assessed/performed Overall Cognitive Status: Within Functional Limits for tasks assessed                                        Exercises      General Comments        Pertinent Vitals/Pain Pain Assessment: 0-10 Pain Score: 6  Pain Location: R leg with weight bearing. Pain Descriptors / Indicators: Sore Pain Intervention(s): Monitored during session;Repositioned    Home Living                      Prior Function            PT Goals (current goals can now be found in the care plan section) Acute Rehab PT Goals Patient Stated Goal: To  return to baseline Potential to Achieve Goals: Good Progress towards PT goals: Progressing toward goals    Frequency    Min 3X/week      PT Plan Current plan remains appropriate    Co-evaluation              AM-PAC PT "6 Clicks" Mobility   Outcome Measure  Help needed turning from your back to your side while in a flat bed without using bedrails?: None Help needed moving from lying on your back to sitting on the side of a flat bed without using bedrails?: None Help needed moving to and from a bed to a chair (including a wheelchair)?: None Help needed standing up from a chair using your arms (e.g., wheelchair or bedside chair)?: None Help needed to walk in hospital room?: A Little Help needed  climbing 3-5 steps with a railing? : A Little 6 Click Score: 22    End of Session Equipment Utilized During Treatment: Gait belt Activity Tolerance: Patient tolerated treatment well Patient left: with call bell/phone within reach;with family/visitor present (Pt sitting edge of bed to eat supper.) Nurse Communication: Mobility status PT Visit Diagnosis: Other abnormalities of gait and mobility (R26.89)     Time: 0454-0981 PT Time Calculation (min) (ACUTE ONLY): 23 min  Charges:  $Gait Training: 8-22 mins $Therapeutic Activity: 8-22 mins                     Bonney Leitz , PTA Acute Rehabilitation Services Pager [phone] Office [phone]     Marlise Fahr Artis Delay 08/08/2020, 5:24 PM

## 2020-08-08 NOTE — Consult Note (Signed)
Consultation Note Date: 08/08/2020   Patient Name: Kevin Flores  DOB: [birth year]  MRN: [id number]  Age / Sex: 79 y.o., male  PCP: Sharion Balloon, FNP Referring Physician: Sherren Mocha, MD  Reason for Consultation: Establishing goals of care  HPI/Patient Profile: 79 y.o. male  with past medical history of CAD s/p remote CABG and prior PCI procedures, hypertension, hyperlipidemia, and chronic kidney disease stage 3b. He underwent cardiac catheterization on 11/17 due to progressive LV systolic dysfunction with EF < 20%. He was found to have severe 3 vessel CAD with no targets for PCI and recommendation for ongoing medical therapy. On 07/28/2020 he developed acute onset of substernal chest pain, and after arrival in the emergency department his EKG evolved into an anterior STEMI. Cardiac cath showed occluded LIMA-LAD which was unable to be vascularized. Hospitalization has been complicated by cardiogenic shock and RUE DVT. Patient is not a candidate for LVAD due to advanced age and chronic kidney disease Palliative Medicine has been consulted to assist with goals of care in the setting of advanced HF and consideration of home milrinone.   11/19- cardiogenic shock, low output HF with lactate 2.3 and AKI, started on milrinone 11/25- milrinone stopped 11/26- milrinone re-started, also found to have RUE DVT - started on heparin gtt and IVC filter placed  Clinical Assessment and Goals of Care: I have reviewed medical records including EPIC notes, labs and imaging, and met at bedside with patient and wife Kevin Flores)  to discuss diagnosis, prognosis, GOC, EOL wishes, disposition, and options.  I introduced Palliative Medicine as specialized medical care for people living with serious illness. It focuses on providing relief from the symptoms and stress of a serious illness.   We discussed a brief life review of the  patient. He is from [place] and has lived here all his life. He and Kevin Flores have been married for 7 years. They have 4 children, all who live in [place]. Kevin Flores describes their family as very close. 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  As far as functional status prior to admission, he was ambulatory and independent with all ADLs.  In retirement, he has remained quite active. He has 2 part-time jobs, one at [employer] course and the other at an Furniture conservator/restorer. He enjoys playing golf and working in his yard.   We discussed his current illness and what it means in the larger context of his ongoing co-morbidities.  Natural disease trajectory of advanced heart failure was discussed. Discussed that his EF is quite low at 10-15% and expressed concern that he is likely inotrope dependent. Patient verbalizes understanding and states he has accepted his situation.   I attempted to elicit values and goals of care important to the patient. His main goal is to be home. His other goal is to be able to do some of the activities he enjoys. He states "I know things won't be like they used to be".  He states he feels like he is on "borrowed time".  He is hopeful that the milrinone will help him feel as good as he can for as long as he can.   The difference between aggressive medical intervention and comfort care was considered in light of the patient's goals of care. We discussed that he is not a candidate for aggressive interventions such as LVAD due to advanced age and chronic kidney disease. We did discuss code status. Encouraged patient to consider DNR/DNI status understanding evidenced based poor outcomes in similar hospitalized patients, as the cause of the arrest is likely associated with chronic/terminal disease rather than a reversible acute cardio-pulmonary event. I expressed recommendation for having a DNR order in place, to  which patient agrees.   Hospice and Palliative Care services outpatient were explained and offered. Discussed that outpatient palliative care will check-in periodically with patient and family and continue goals of care discussions. Patient and wife agree that this would be helpful.   Questions and concerns were addressed.  The patient was encouraged to call with questions or concerns.   Primary decision maker: Patient. Surrogate decision maker would be spouse [name].     SUMMARY OF RECOMMENDATIONS   - code status changed to DNR/DNI - goal of care is to be home, and have more time to spend with family and to feel as good as he can for as long as he can - disposition likely home with [place] and milrinone infusion - recommend outpatient palliative follow-up - PMT will continue to follow  Code Status/Advance Care Planning:  DNR  Symptom Management:  - melatonin 3 mg OR trazodone 100 mg at bedtime prn for insomnia - oxymetazoline (AFRIN) 1 spray each nare 2 times daily  - oxycodone IR 5/10 mg every 4 hours prn moderate pain  Palliative Prophylaxis:   Frequent Pain Assessment  Psycho-social/Spiritual:   Created space and opportunity for patient and family to express thoughts and feelings regarding patient's current medical situation.   Emotional support provided   Prognosis:   < 6 months  Discharge Planning: Home with Home Health      Primary Diagnoses: Present on Admission: . STEMI involving left anterior descending coronary artery ([place])   I have reviewed the medical record, interviewed the patient and family, and examined the patient. The following aspects are pertinent.  Past Medical History:  Diagnosis Date  . Allergy    seasonal  . Arthritis   . CAD (coronary artery disease)    1998 LIMA to the LAD, SVG to circumflex. DES placed to the PDA 11/2009   . GERD with stricture   . Gout   . Heartburn   . Hyperlipidemia   . Hypertension   . Nephrolithiasis     left  . Personal history of colonic polyps    adenomas  . Thyroid disease    Social History   Socioeconomic History  . Marital status: Married    Spouse name: Kevin Flores  . Number of children: 4  . Years of education: 1  . Highest education level: High school graduate  Occupational History  . Occupation: retired  Tobacco Use  . Smoking status: Former Smoker    Packs/day: 1.00    Years: 12.00    Pack years: 12.00    Types: Cigarettes    Quit date: 12/19/1971    Years since quitting: 48.6  . Smokeless tobacco: Never Used  Vaping Use  . Vaping Use: Never used  Substance and Sexual Activity  .  Alcohol use: No  . Drug use: No  . Sexual activity: Not Currently  Other Topics Concern  . 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 Colon cancer Brother        17s  . Diabetes Brother   . Heart disease Brother   . Diabetes Father   . Breast cancer Sister   . Esophageal cancer Neg Hx   . Liver cancer Neg Hx   . Pancreatic cancer Neg Hx   . Prostate cancer Neg Hx   . Rectal cancer Neg Hx   . Stomach cancer Neg Hx    Scheduled Meds: . allopurinol  300 mg Oral Daily  . aspirin  81 mg Oral Daily  . atorvastatin  20 mg Oral Daily  . Chlorhexidine Gluconate Cloth  6 each Topical Daily  . clopidogrel  75 mg Oral Daily  . docusate sodium  100 mg Oral BID  . fluticasone  1 spray Each Nare Daily  . guaiFENesin  600 mg Oral BID  . levothyroxine  75 mcg Oral Q0600  . loratadine  10 mg Oral QPM  . mometasone-formoterol  2 puff Inhalation BID  . montelukast  10 mg Oral QHS  . oxymetazoline  1 spray Each Nare BID  . pantoprazole  40 mg Oral  Daily  . polyethylene glycol  17 g Oral Daily  . sodium chloride flush  10-40 mL Intracatheter Q12H  . sodium chloride flush  3 mL Intravenous Q12H   Continuous Infusions: . sodium chloride 20 mL/hr at 07/28/20 1205  . sodium chloride    . heparin 1,250 Units/hr (08/08/20 1034)  . milrinone 0.25 mcg/kg/min (08/08/20 0356)   PRN Meds:.sodium chloride, acetaminophen, alum & mag hydroxide-simeth, melatonin, morphine injection, ondansetron (ZOFRAN) IV, oxyCODONE, traZODone Medications Prior to Admission:  Prior to Admission medications   Medication Sig Start Date End Date Taking? Authorizing Provider  allopurinol (ZYLOPRIM) 300 MG tablet Take 1 tablet (300 mg total) by mouth daily. 01/12/20  Yes Hawks, Christy A, FNP  aspirin 81 MG chewable tablet Chew 1 tablet (81 mg total) by mouth daily. 11/25/17  Yes Daune Perch, NP  atorvastatin (LIPITOR) 20 MG tablet Take 1 tablet (20 mg total) by mouth daily. 01/12/20  Yes Hawks, Christy A, FNP  budesonide-formoterol (SYMBICORT) 160-4.5 MCG/ACT inhaler Inhale 2 puffs into the lungs in the morning and at bedtime. Patient taking differently: Inhale 2 puffs into the lungs 2 (two) times daily as needed (shortness of breath).  04/26/20  Yes Chesley Mires, MD  Carboxymethylcellul-Glycerin (LUBRICATING EYE DROPS OP) Place 1 drop into both eyes daily as needed (dry eyes).   Yes [provider]  carvedilol (COREG) 12.5 MG tablet Take 0.5 tablets (6.25 mg total) by mouth 2 (two) times daily. 07/14/20 10/12/20 Yes Minus Breeding, MD  Cholecalciferol (VITAMIN D-3 PO) Take 1 capsule by mouth daily.   Yes [provider]  fish oil-omega-3 fatty acids 1000 MG capsule Take 1 g by mouth at bedtime.    Yes [provider]  fluticasone (FLONASE) 50 MCG/ACT nasal spray Place 1  spray into both nostrils daily. 01/19/20  Yes Chesley Mires, MD  furosemide (LASIX) 20 MG tablet TAKE 1 TABLET BY MOUTH EVERY OTHER DAY Patient taking differently: Take 20 mg by  mouth every other day.  03/21/20  Yes Hawks, Christy A, FNP  guaiFENesin (MUCINEX) 600 MG 12 hr tablet Take 2 tablets (1,200 mg total) by mouth 2 (two) times daily as needed for cough or to loosen phlegm. Patient taking differently: Take 600 mg by mouth 2 (two) times daily.  05/17/20  Yes Chesley Mires, MD  levocetirizine (XYZAL) 5 MG tablet Take 1 tablet (5 mg total) by mouth every evening. 01/19/20  Yes Chesley Mires, MD  levothyroxine (SYNTHROID) 75 MCG tablet TAKE 1 TABLET BY MOUTH EVERY DAY Patient taking differently: Take 75 mcg by mouth daily.  03/30/20  Yes Hawks, Christy A, FNP  montelukast (SINGULAIR) 10 MG tablet TAKE 1 TABLET BY MOUTH EVERYDAY AT BEDTIME Patient taking differently: Take 10 mg by mouth at bedtime.  07/18/20  Yes Chesley Mires, MD  pantoprazole (PROTONIX) 40 MG tablet TAKE 1 TABLET BY MOUTH EVERY DAY Patient taking differently: Take 40 mg by mouth daily.  06/14/20  Yes Hawks, Christy A, FNP  vitamin C (ASCORBIC ACID) 500 MG tablet Take 500 mg by mouth daily.   Yes [provider]   Allergies  Allergen Reactions  . Imdur [Isosorbide Dinitrate] Other (See Comments)    Headache, "made my head swim"  . Rosuvastatin Other (See Comments)    SEVERE muscle and leg cramps/aches  . Sulfonamide Derivatives Rash and Other (See Comments)    Also "made the whites of my eyes turn yellow"   Review of Systems  HENT: Positive for congestion.   Gastrointestinal: Positive for constipation.    Physical Exam Vitals reviewed.  Constitutional:      General: He is not in acute distress. HENT:     Head: Normocephalic and atraumatic.  Cardiovascular:     Comments: Sinus rhythm with PVC's on the monitor Pulmonary:     Effort: Pulmonary effort is normal.  Neurological:     Mental Status: He is alert and oriented to person, place, and time.     Vital Signs: BP 116/90 (BP Location: Left Arm)   Pulse (!) 104   Temp 98.4 F (36.9 C) (Oral)   Resp (!) 26   Ht _0  (1.753 m)    Wt 84.8 kg   SpO2 94%   BMI 27.61 kg/m  Pain Scale: 0-10 POSS *See Group Information*: 1-Acceptable,Awake and alert Pain Score: 0-No pain   SpO2: SpO2: 94 % O2 Device:SpO2: 94 %  IO: Intake/output summary:   Intake/Output Summary (Last 24 hours) at 08/08/2020 1248 Last data filed at 08/08/2020 0800 Gross per 24 hour  Intake 1312.01 ml  Output 1250 ml  Net 62.01 ml    LBM: Last BM Date: 08/04/20 Baseline Weight: Weight: 86.2 kg Most recent weight: Weight: 84.8 kg      Palliative Assessment/Data: PPS 50%     Time In: 13:10 Time Out: 14:05 Time Total: 55 minutes Greater than 50%  of this time was spent counseling and coordinating care related to the above assessment and plan.  Signed by: Lavena Bullion, NP   Please contact Palliative Medicine Team phone at [phone] for questions and concerns.  For individual provider: See Shea Evans

## 2020-08-08 NOTE — TOC Progression Note (Addendum)
Transition of Care [facility]) - Progression Note    Patient Details  Name: Benett Swoyer MRN: [id number] Date of Birth: 1940-10-05  [facility]) CM/SW Contact  Leone Haven, RN Phone Number: 08/08/2020, 12:48 PM  Clinical Narrative:    NCM spoke with patient and wife at bedside, he will need home milrinone.  NCM offered choice, they state , they do not have a preference.  NCM made referral to Jeri Modena for the medication and teaching and made referral Kenzie with [facility].  Patient states he will have transportation to go home at discharge.  He has a lift chair at home.        Expected Discharge Plan and Services                                                 Social Determinants of Health (SDOH) Interventions    Readmission Risk Interventions No flowsheet data found.

## 2020-08-08 NOTE — TOC Transition Note (Signed)
Transition of Care [facility]) - CM/SW Discharge Note   Patient Details  Name: Hezikiah Retzloff MRN: [id number] Date of Birth: 10/03/1940  Transition of Care [facility]) CM/SW Contact:  Leone Haven, RN Phone Number: 08/08/2020, 1:00 PM   Clinical Narrative:    NCM spoke with patient and wife at bedside, he will need home milrinone.  NCM offered choice, they state , they do not have a preference.  NCM made referral to Jeri Modena for the medication and teaching and made referral Kenzie with [facility]  Patient states he will have transportation to go home at discharge.  He has a lift chair at home.   Final next level of care: Home w Home Health Services Barriers to Discharge: Continued Medical Work up   Patient Goals and CMS Choice Patient states their goals for this hospitalization and ongoing recovery are:: go home and get better CMS Medicare.gov Compare Post Acute Care list provided to:: Patient Choice offered to / list presented to : Patient  Discharge Placement                       Discharge Plan and Services                  DME Agency: NA       HH Arranged: RN [facility] Agency: [facility] Date HH Agency Contacted: 08/08/20 Time HH Agency Contacted: 1259 Representative spoke with at [facility] Agency: Pearson Grippe  Social Determinants of Health (SDOH) Interventions     Readmission Risk Interventions No flowsheet data found.

## 2020-08-08 NOTE — Progress Notes (Signed)
**  Referring Physician(s): Dr. Gala Romney  Supervising Physician: Ruel Favors  Patient Status: [facility] - In-pt  Subjective: Follow up RLE DVT Seen a few days ago for RLE DVT and placement of IVC filter. Pt was started on IV heparin. Discussion for thrombolysis/thrombectomy if no improvement on conservative mgmt was had at that time as well. Pt reports leg feels better today. Was able to get up OOB and do some walking. Still reports swelling but thinks it's better.  Objective: Physical Exam: BP 116/90 (BP Location: Left Arm)   Pulse (!) 104   Temp 98.4 F (36.9 C) (Oral)   Resp (!) 26   Ht 5\' 9"  (1.753 m)   Wt 84.8 kg   SpO2 94%   BMI 27.61 kg/m  (R)LE still with 2+ edema from calf to thigh but markedly softer than on exam Friday. NT, foot warm.  Current Facility-Administered Medications:  .  0.9 %  sodium chloride infusion, , Intravenous, Continuous, Bensimhon, Monday, MD, Last Rate: 20 mL/hr at 07/28/20 1205, New Bag/Given (Non-Interop) at 07/28/20 1205 .  0.9 %  sodium chloride infusion, 250 mL, Intravenous, PRN, Bensimhon, 07/30/20, MD .  acetaminophen (TYLENOL) tablet 650 mg, 650 mg, Oral, Q4H PRN, Bensimhon, Bevelyn Buckles, MD, 650 mg at 07/29/20 0227 .  allopurinol (ZYLOPRIM) tablet 300 mg, 300 mg, Oral, Daily, Bensimhon, 07/31/20, MD, 300 mg at 08/08/20 1035 .  alum & mag hydroxide-simeth (MAALOX/MYLANTA) 200-200-20 MG/5ML suspension 30 mL, 30 mL, Oral, Q4H PRN, Bensimhon, 03-19-2001, MD, 30 mL at 07/29/20 1254 .  aspirin chewable tablet 81 mg, 81 mg, Oral, Daily, Bensimhon, 07/31/20, MD, 81 mg at 08/08/20 1035 .  atorvastatin (LIPITOR) tablet 20 mg, 20 mg, Oral, Daily, Bensimhon, 08/10/20, MD, 20 mg at 08/07/20 1854 .  Chlorhexidine Gluconate Cloth 2 % PADS 6 each, 6 each, Topical, Daily, Bensimhon, 08/09/20, MD, 6 each at 08/08/20 1037 .  clopidogrel (PLAVIX) tablet 75 mg, 75 mg, Oral, Daily, Bensimhon, 08/10/20, MD, 75 mg at 08/08/20 1035 .  docusate sodium (COLACE)  capsule 100 mg, 100 mg, Oral, BID, Liggett, 08/10/20, NP, 100 mg at 08/08/20 1035 .  fluticasone (FLONASE) 50 MCG/ACT nasal spray 1 spray, 1 spray, Each Nare, Daily, Bensimhon, 08/10/20, MD, 1 spray at 08/07/20 [phone] .  guaiFENesin (MUCINEX) 12 hr tablet 600 mg, 600 mg, Oral, BID, Liggett, 08-03-1992, NP, 600 mg at 08/08/20 1035 .  heparin ADULT infusion 100 units/mL (25000 units/269mL sodium chloride 0.45%), 1,250 Units/hr, Intravenous, Continuous, 45m, RPH, Last Rate: 12.5 mL/hr at 08/08/20 1034, 1,250 Units/hr at 08/08/20 1034 .  levothyroxine (SYNTHROID) tablet 75 mcg, 75 mcg, Oral, Q0600, Bensimhon, 08/10/20, MD, 75 mcg at 08/08/20 0523 .  loratadine (CLARITIN) tablet 10 mg, 10 mg, Oral, QPM, Bensimhon, 08/10/20, MD, 10 mg at 08/07/20 1854 .  melatonin tablet 3 mg, 3 mg, Oral, QHS PRN, 08/09/20, NP, 3 mg at 08/05/20 2113 .  milrinone (PRIMACOR) 20 MG/100 ML (0.2 mg/mL) infusion, 0.25 mcg/kg/min, Intravenous, Continuous, Bensimhon, 2114, MD, Last Rate: 6.2 mL/hr at 08/08/20 0356, 0.25 mcg/kg/min at 08/08/20 0356 .  mometasone-formoterol (DULERA) 200-5 MCG/ACT inhaler 2 puff, 2 puff, Inhalation, BID, Bensimhon, 08/10/20, MD, 2 puff at 08/08/20 [phone] .  montelukast (SINGULAIR) tablet 10 mg, 10 mg, Oral, QHS, Bensimhon, 8185, MD, 10 mg at 08/07/20 2130 .  morphine 2 MG/ML injection 2 mg, 2 mg, Intravenous, Q1H PRN, Bensimhon, 2131, MD,  2 mg at 07/29/20 1241 .  ondansetron (ZOFRAN) injection 4 mg, 4 mg, Intravenous, Q6H PRN, Bensimhon, Bevelyn Buckles, MD .  oxyCODONE (Oxy IR/ROXICODONE) immediate release tablet 5-10 mg, 5-10 mg, Oral, Q4H PRN, Bensimhon, Bevelyn Buckles, MD, 10 mg at 08/05/20 0331 .  oxymetazoline (AFRIN) 0.05 % nasal spray 1 spray, 1 spray, Each Nare, BID, Liggett, Lyman Speller, NP, 1 spray at 08/08/20 1035 .  pantoprazole (PROTONIX) EC tablet 40 mg, 40 mg, Oral, Daily, Bensimhon, Bevelyn Buckles, MD, 40 mg at 08/08/20 1035 .  polyethylene glycol (MIRALAX / GLYCOLAX) packet 17 g,  17 g, Oral, Daily, Bensimhon, Bevelyn Buckles, MD, 17 g at 08/08/20 1037 .  sodium chloride flush (NS) 0.9 % injection 10-40 mL, 10-40 mL, Intracatheter, Q12H, Bensimhon, Bevelyn Buckles, MD, 10 mL at 08/08/20 1037 .  sodium chloride flush (NS) 0.9 % injection 3 mL, 3 mL, Intravenous, Q12H, Bensimhon, Bevelyn Buckles, MD, 3 mL at 08/08/20 1037 .  traZODone (DESYREL) tablet 100 mg, 100 mg, Oral, QHS PRN, Bensimhon, Bevelyn Buckles, MD, 100 mg at 08/07/20 2306  Labs: CBC Recent Labs    08/07/20 0325 08/08/20 0221  WBC 18.1* 15.5*  HGB 13.7 13.4  HCT 42.0 40.9  PLT 171 196   BMET Recent Labs    08/07/20 0325 08/08/20 0221  NA 134* 134*  K 4.4 4.3  CL 95* 95*  CO2 27 26  GLUCOSE 134* 127*  BUN 90* 79*  CREATININE 2.92* 2.42*  CALCIUM 9.0 8.9    Studies/Results: No results found.  Assessment/Plan: (R)LE DVT Clinical improvement in both edema and discomfort over the past 48-72 hrs on IV heparin. We re-discussed the role for thrombolysis/thrombectomy including the risks, complications, etc. At this point, since he is making clinical progress, do not feel invasive procedure would add much at this point. Okay to transition to po anticoagulation. Can follow up in our clinic in 6-8 weeks for both DVT and filter.    LOS: 11 days   I spent a total of 20 minutes in face to face in clinical consultation, greater than 50% of which was counseling/coordinating care for (R)LE DVT  Brayton El PA-C 08/08/2020 12:00 PM

## 2020-08-08 NOTE — Progress Notes (Signed)
CARDIAC REHAB PHASE I   PRE:  Rate/Rhythm: 98 SR with PVCs    BP: sitting 122/82    SaO2: 96 RA  MODE:  Ambulation: 90 ft   POST:  Rate/Rhythm: 111 ST    BP: sitting 111/81     SaO2: 92 RA, up to 97 RA  Pt in recliner, willing to ambulate. Stood with min assist and ambulated with RW, gait belt support as needed. Pt able to bear weight gently on right leg. Fatigue with distance. To bed, VSS.   [phone]  Harriet Masson CES, ACSM 08/08/2020 9:31 AM

## 2020-08-09 ENCOUNTER — Inpatient Hospital Stay (HOSPITAL_COMMUNITY): Payer: Medicare Other

## 2020-08-09 DIAGNOSIS — N1832 Chronic kidney disease, stage 3b: Secondary | ICD-10-CM | POA: Diagnosis not present

## 2020-08-09 DIAGNOSIS — I5023 Acute on chronic systolic (congestive) heart failure: Secondary | ICD-10-CM | POA: Diagnosis not present

## 2020-08-09 DIAGNOSIS — I2102 ST elevation (STEMI) myocardial infarction involving left anterior descending coronary artery: Secondary | ICD-10-CM | POA: Diagnosis not present

## 2020-08-09 HISTORY — PX: IR US GUIDE VASC ACCESS RIGHT: IMG2390

## 2020-08-09 HISTORY — PX: IR FLUORO GUIDE CV LINE RIGHT: IMG2283

## 2020-08-09 LAB — CBC
HCT: 39.5 % (ref 39.0–52.0)
Hemoglobin: 12.8 g/dL — ABNORMAL LOW (ref 13.0–17.0)
MCH: 29.6 pg (ref 26.0–34.0)
MCHC: 32.4 g/dL (ref 30.0–36.0)
MCV: 91.2 fL (ref 80.0–100.0)
Platelets: 211 10*3/uL (ref 150–400)
RBC: 4.33 MIL/uL (ref 4.22–5.81)
RDW: 14.5 % (ref 11.5–15.5)
WBC: 13.5 10*3/uL — ABNORMAL HIGH (ref 4.0–10.5)
nRBC: 0 % (ref 0.0–0.2)

## 2020-08-09 LAB — BASIC METABOLIC PANEL
Anion gap: 14 (ref 5–15)
BUN: 71 mg/dL — ABNORMAL HIGH (ref 8–23)
CO2: 26 mmol/L (ref 22–32)
Calcium: 9 mg/dL (ref 8.9–10.3)
Chloride: 95 mmol/L — ABNORMAL LOW (ref 98–111)
Creatinine, Ser: 2.2 mg/dL — ABNORMAL HIGH (ref 0.61–1.24)
GFR, Estimated: 30 mL/min — ABNORMAL LOW (ref >60)
Glucose, Bld: 121 mg/dL — ABNORMAL HIGH (ref 70–99)
Potassium: 4.4 mmol/L (ref 3.5–5.1)
Sodium: 135 mmol/L (ref 135–145)

## 2020-08-09 LAB — HEPARIN LEVEL (UNFRACTIONATED): Heparin Unfractionated: 0.57 IU/mL (ref 0.30–0.70)

## 2020-08-09 LAB — COOXEMETRY PANEL
Carboxyhemoglobin: 1.5 % (ref 0.5–1.5)
Methemoglobin: 1 % (ref 0.0–1.5)
O2 Saturation: 69.8 %
Total hemoglobin: 13.1 g/dL (ref 12.0–16.0)

## 2020-08-09 MED ORDER — SALINE SPRAY 0.65 % NA SOLN
1.0000 | NASAL | Status: DC | PRN
  Filled 2020-08-09: qty 44

## 2020-08-09 MED ORDER — APIXABAN 5 MG PO TABS: 5.0000 mg | ORAL_TABLET | Freq: Two times a day (BID) | ORAL | Status: DC

## 2020-08-09 MED ORDER — LIDOCAINE HCL 1 % IJ SOLN
INTRAMUSCULAR | Status: AC
  Filled 2020-08-09: qty 20

## 2020-08-09 MED ORDER — LIDOCAINE HCL (PF) 1 % IJ SOLN
INTRAMUSCULAR | Status: DC | PRN
  Administered 2020-08-09: 10 mL

## 2020-08-09 MED ORDER — APIXABAN 5 MG PO TABS
10.0000 mg | ORAL_TABLET | Freq: Two times a day (BID) | ORAL | Status: DC
  Administered 2020-08-09 – 2020-08-11 (×4): 10 mg via ORAL
  Filled 2020-08-09 (×5): qty 2

## 2020-08-09 NOTE — TOC Benefit Eligibility Note (Signed)
Transition of Care [facility]) Benefit Eligibility Note    Patient Details  Name: Master Touchet MRN: [id number] Date of Birth: 1941/02/11   Medication/Dose: Eliquis 2.5 mg and or 5mg  bid for 30 day supply  Covered?: Yes  Tier:  (?)  Prescription Coverage [facility]  Spoke with Person/Company/Phone Number:: [phone]. [employer],PH# [phone]  Co-Pay: $9.16  Prior Approval: No (PPA required for 50 mg.)  Deductible: Unmet       [phone] Phone Number: 08/09/2020, 4:43 PM

## 2020-08-09 NOTE — Progress Notes (Signed)
[phone] Came to see pt to walk. Pt stated he just worked with OT and had gotten back to bed. Resting now. Asked if I could return later. Will follow up as time permits. PT to see today also. Emotional support given . Pt discussed what he had been through since I last saw him last week. Luetta Nutting RN BSN 08/09/2020 10:36 AM

## 2020-08-09 NOTE — Plan of Care (Signed)
  Problem: Education: Goal: Understanding of CV disease, CV risk reduction, and recovery process will improve 08/09/2020 1116 by Sharol Given, RN Outcome: Progressing 08/09/2020 1116 by Sharol Given, RN Outcome: Progressing Goal: Individualized Educational Video(s) 08/09/2020 1116 by Sharol Given, RN Outcome: Progressing 08/09/2020 1116 by Sharol Given, RN Outcome: Progressing

## 2020-08-09 NOTE — Progress Notes (Signed)
[id number] Returned to see if pt ready to walk. Pt awaiting PICC. Will hold walk for now . Luetta Nutting RN BSN 08/09/2020 1:56 PM

## 2020-08-09 NOTE — Progress Notes (Signed)
Daily Progress Note   Patient Name: Kevin Flores       Date: 08/09/2020 DOB: May 11, 1941  Age: 79 y.o. MRN#: [id number] Attending Physician: Tonny Bollman, MD Primary Care Physician: Junie Spencer, FNP Admit Date: 07/28/2020  Reason for Follow-up: continued GOC discussion  Subjective: Chart reviewed and received report from nursing. Noted that heparin infusion is off; patient transitioned to oral anticoagulation.   Patient is in bed, states it has been a "long day" with getting the PICC line placed. We discuss that it is one step closer to getting him home.   Reviewed our discussion from yesterday regarding outpatient palliative services, explained that they could check in periodically to continue GOC discussions. Also discussed they could assist with transition to hospice care in the future if needed. Patient agrees to outpatient palliative follow-up.  I completed a MOST form today. The patient outlined the following decisions:   Cardiopulmonary Resuscitation: Do Not Attempt Resuscitation (DNR/No CPR)  Medical Interventions: Limited Additional Interventions: Use medical treatment, IV fluids and cardiac monitoring as indicated. DO NOT USE intubation or mechanical ventilation. May consider use of less invasive airway support such as BiPAP or CPAP. Also provide comfort measures. Transfer to the hospital if indicated. Avoid intensive care.   Antibiotics: - Antibiotic if indicated  IV Fluids: - IV fluids for a defined trial period  Feeding Tube: - Feeding tube for a defined trial period      Length of Stay: 12  Current Medications: Scheduled Meds:   allopurinol  300 mg Oral Daily   apixaban  10 mg Oral BID   Followed by   Melene Muller ON 08/16/2020] apixaban  5 mg Oral BID   aspirin   81 mg Oral Daily   atorvastatin  20 mg Oral Daily   Chlorhexidine Gluconate Cloth  6 each Topical Daily   clopidogrel  75 mg Oral Daily   docusate sodium  100 mg Oral BID   fluticasone  1 spray Each Nare Daily   guaiFENesin  600 mg Oral BID   levothyroxine  75 mcg Oral Q0600   lidocaine       loratadine  10 mg Oral QPM   mometasone-formoterol  2 puff Inhalation BID   montelukast  10 mg Oral QHS   oxymetazoline  1 spray Each Nare BID   pantoprazole  40 mg Oral Daily   polyethylene glycol  17 g Oral Daily   sodium chloride flush  10-40 mL Intracatheter Q12H   sodium chloride flush  3 mL Intravenous Q12H    Continuous Infusions:  sodium chloride 20 mL/hr at 07/28/20 1205   sodium chloride     milrinone 0.25 mcg/kg/min (08/09/20 0748)    PRN Meds: sodium chloride, acetaminophen, alum & mag hydroxide-simeth, lidocaine (PF), melatonin, morphine injection, ondansetron (ZOFRAN) IV, oxyCODONE, sodium chloride, traZODone  Physical Exam Vitals reviewed.  Constitutional:      General: He is not in acute distress. Cardiovascular:     Comments: Sinus rhythm with PVC's Pulmonary:     Effort: Pulmonary effort is normal.  Neurological:     Mental Status: He is alert and oriented to person, place, and time.             Vital Signs: BP 103/82 (BP Location: Left Arm)    Pulse (!) 113    Temp 97.6 F (36.4 C) (Oral)    Resp (!) 25    Ht 5\' 9"  (1.753 m)    Wt 81.3 kg Comment: scale B   SpO2 94%    BMI 26.47 kg/m  SpO2: SpO2: 94 % O2 Device: O2 Device: Nasal Cannula O2 Flow Rate: O2 Flow Rate (L/min): 2 L/min  Intake/output summary:   Intake/Output Summary (Last 24 hours) at 08/09/2020 1909 Last data filed at 08/09/2020 1057 Gross per 24 hour  Intake 362.06 ml  Output 975 ml  Net -612.94 ml   LBM: Last BM Date: 08/08/20 Baseline Weight: Weight: 86.2 kg Most recent weight: Weight: 81.3 kg (scale B)       Palliative Assessment/Data: PPS  50%       Palliative Care Assessment & Plan   HPI/Patient Profile: 79 y.o. male  with past medical history of CAD s/p remote CABG and prior PCI procedures, hypertension, hyperlipidemia, and chronic kidney disease stage 3b. He underwent cardiac catheterization on 11/17 due to progressive LV systolic dysfunction with EF < 20%. He was found to have severe 3 vessel CAD with no targets for PCI and recommendation for ongoing medical therapy. On 07/28/2020 he developed acute onset of substernal chest pain, and after arrival in the emergency department his EKG evolved into an anterior STEMI. Cardiac cath showed occluded LIMA-LAD which was unable to be vascularized. Hospitalization has been complicated by cardiogenic shock and RUE DVT. Patient is not a candidate for LVAD due to advanced age and chronic kidney disease Palliative Medicine has been consulted to assist with goals of care in the setting of advanced HF and consideration of home milrinone.   11/19- cardiogenic shock, low output HF with lactate 2.3 and AKI, started on milrinone 11/25- milrinone stopped 11/26- milrinone re-started, also found to have RUE DVT - started on heparin gtt and IVC filter placed  Assessment: - acute on chronic systolic HF - cardiogenic shock - CAD with acute anterior infarct  - AKI on CKD 3b - RLE DVT - insomnia  Recommendations/Plan: - DNR/DNI as previously documented - MOST form completed and original placed on shadow chart - goal of care is to be home, to spend time with family, and feel as good as he can for as long as he can - disposition is home with HH and milrinone infusion - recommend outpatient palliative follow-up  Code Status:  DNR  Prognosis:   less than 6 months  Discharge Planning:  Home with home health and outpatient palliative  Care  plan was discussed with nursing  Thank you for allowing the Palliative Medicine Team to assist in the care of this patient.   Total Time 25  minutes Prolonged Time Billed no      Greater than 50%  of this time was spent counseling and coordinating care related to the above assessment and plan.  Merry Proud, NP  Please contact Palliative Medicine Team phone at [phone] for questions and concerns.

## 2020-08-09 NOTE — Procedures (Signed)
Interventional Radiology Procedure Note  Procedure: Right IJ tunneled central venous catheter  Complications: None  Estimated Blood Loss: < 10 mL  Findings: 24 cm DL tunneled Power Line via right IJ with tip at SVC/RA junction. OK to use.  Jodi Marble. Fredia Sorrow, M.D Pager:  [phone]

## 2020-08-09 NOTE — Progress Notes (Signed)
PT Cancellation Note  Patient Details Name: Kevin Flores MRN: [id number] DOB: 05-31-1941   Cancelled Treatment:    Reason Eval/Treat Not Completed: (P) Patient at procedure or test/unavailable (pt off unit for picc line placement.)   [name] Artis Delay 08/09/2020, 4:19 PM  Bonney Leitz , PTA Acute Rehabilitation Services Pager [phone] Office [phone]

## 2020-08-09 NOTE — Progress Notes (Signed)
ANTICOAGULATION CONSULT NOTE  Pharmacy Consult for Heparin Indication: DVT  Allergies  Allergen Reactions  . Imdur [Isosorbide Dinitrate] Other (See Comments)    Headache, "made my head swim"  . Rosuvastatin Other (See Comments)    SEVERE muscle and leg cramps/aches  . Sulfonamide Derivatives Rash and Other (See Comments)    Also "made the whites of my eyes turn yellow"    Patient Measurements: Height: 5\' 9"  (175.3 cm) Weight: 81.3 kg (179 lb 3.7 oz) (scale B) IBW/kg (Calculated) : 70.7  Heparin dosing weight: 82.6 kg  Vital Signs: Temp: 97.9 F (36.6 C) (11/30 0310) Temp Source: Axillary (11/30 0310) BP: 104/78 (11/30 0751) Pulse Rate: 200 (11/30 0751)  Labs: Recent Labs    08/07/20 0325 08/07/20 0325 08/07/20 1356 08/08/20 0221 08/09/20 0315  HGB 13.7   < >  --  13.4 12.8*  HCT 42.0  --   --  40.9 39.5  PLT 171  --   --  196 211  HEPARINUNFRC 1.08*   < > 0.72* 0.67 0.57  CREATININE 2.92*  --   --  2.42* 2.20*   < > = values in this interval not displayed.    Estimated Creatinine Clearance: 28.1 mL/min (A) (by C-G formula based on SCr of 2.2 mg/dL (H)).   Assessment: 79 YOM on DAPT with noted leg swelling on exam and Doppler confirming extensive DVT.  Pharmacy has been consulted for heparin dosing. Patient is s/p IVC filter placement on 11/26.    Heparin level therapeutic at 0.57, H/H down slightly.   Goal of Therapy:  Heparin level 0.3-0.7 units/ml Monitor platelets by anticoagulation protocol: Yes   Plan:  Continue heparin 1250 units/h Daily heparin level and CBC   12/26, PharmD, [place], [facility] Clinical Pharmacist [phone] Please check AMION for all [facility] Pharmacy numbers 08/09/2020

## 2020-08-09 NOTE — Progress Notes (Addendum)
Patient ID: Kevin Flores, male   DOB: 1941-03-17, 79 y.o.   MRN: [id number]    Advanced Heart Failure Rounding Note   Subjective:    Started on milrinone 11/19 for low output HF/shock. Milrinone stopped 11/25.  Co-ox dropped and creatinine rose, milrinone restarted 11/26.  Placed on heparin gtt and IVC filter placed on 11/26 for extensive RLE DVT.  Currently on milrinone 0.25 with co-ox up to 70% today.  CVP 5. Diuretics on hold. Wt stable. SCr continues to trend down, 2.92>>2.42>>2.20.    RLE is feeling much better today. Less tight.   Main complaint is nasal congestion. Using Afrin.     Palliative care team following. Goal of care is to be home, and have more time to spend with family and to feel as good as he can for as long as he can. Planning d/c home w/ palliative milrinone.     Objective:   Weight Range:  Vital Signs:   Temp:  [97.4 F (36.3 C)-98.4 F (36.9 C)] 97.9 F (36.6 C) (11/30 0310) Pulse Rate:  [92-200] 200 (11/30 0751) Resp:  [14-26] 15 (11/30 0751) BP: (100-116)/(74-90) 104/78 (11/30 0751) SpO2:  [85 %-100 %] 100 % (11/30 0759) Weight:  [81.3 kg] 81.3 kg (11/30 0356) Last BM Date: 08/08/20  Weight change: Filed Weights   08/07/20 0300 08/08/20 0356 08/09/20 0356  Weight: 82.7 kg 84.8 kg 81.3 kg    Intake/Output:   Intake/Output Summary (Last 24 hours) at 08/09/2020 1115 Last data filed at 08/09/2020 1057 Gross per 24 hour  Intake 642.51 ml  Output 1876 ml  Net -1233.49 ml   PHYSICAL EXAM: CVP 5 General:  Well appearing, elderly male. No respiratory difficulty HEENT: normal Neck: supple. JVD ~6 cm. Rt IJ CVC Carotids 2+ bilat; no bruits. No lymphadenopathy or thyromegaly appreciated. Cor: PMI nondisplaced. Regular rate & rhythm. No rubs, gallops or murmurs. Lungs: clear Abdomen: soft, nontender, nondistended. No hepatosplenomegaly. No bruits or masses. Good bowel sounds. Extremities: no cyanosis, clubbing, rash, 2+ RLL edema + TED stocking, no  edema on the left  Neuro: alert & oriented x 3, cranial nerves grossly intact. moves all 4 extremities w/o difficulty. Affect pleasant.    Telemetry:  Sinus tach low 100s  Personally reviewed.    Labs: Basic Metabolic Panel: Recent Labs  Lab 08/03/20 0809 08/04/20 0351 08/05/20 0500 08/05/20 0500 08/06/20 0349 08/06/20 0349 08/07/20 0325 08/08/20 0221 08/08/20 2217 08/09/20 0315  NA  --    < > 138  --  136  --  134* 134*  --  135  K  --    < > 4.1  --  4.2  --  4.4 4.3  --  4.4  CL  --    < > 95*  --  94*  --  95* 95*  --  95*  CO2  --    < > 22  --  26  --  27 26  --  26  GLUCOSE  --    < > 140*  --  139*  --  134* 127*  --  121*  BUN  --    < > 76*  --  95*  --  90* 79*  --  71*  CREATININE  --    < > 3.20*  --  3.42*  --  2.92* 2.42*  --  2.20*  CALCIUM  --    < > 9.8   < > 9.1   < > 9.0 8.9  --  9.0  MG 2.4  --   --   --   --   --   --   --  2.6*  --    < > = values in this interval not displayed.    Liver Function Tests: No results for input(s): AST, ALT, ALKPHOS, BILITOT, PROT, ALBUMIN in the last 168 hours. No results for input(s): LIPASE, AMYLASE in the last 168 hours. No results for input(s): AMMONIA in the last 168 hours.  CBC: Recent Labs  Lab 08/05/20 0500 08/06/20 0349 08/07/20 0325 08/08/20 0221 08/09/20 0315  WBC 13.3* 18.4* 18.1* 15.5* 13.5*  HGB 15.3 14.0 13.7 13.4 12.8*  HCT 47.1 43.2 42.0 40.9 39.5  MCV 92.2 91.3 90.7 90.1 91.2  PLT 169 167 171 196 211    Cardiac Enzymes: No results for input(s): CKTOTAL, CKMB, CKMBINDEX, TROPONINI in the last 168 hours.  BNP: BNP (last 3 results) Recent Labs    06/09/20 1012 07/29/20 1643  BNP 774.8* 3,099.8*    ProBNP (last 3 results) No results for input(s): PROBNP in the last 8760 hours.    Other results:  Imaging: No results found.   Medications:     Scheduled Medications: . allopurinol  300 mg Oral Daily  . aspirin  81 mg Oral Daily  . atorvastatin  20 mg Oral Daily  .  Chlorhexidine Gluconate Cloth  6 each Topical Daily  . clopidogrel  75 mg Oral Daily  . docusate sodium  100 mg Oral BID  . fluticasone  1 spray Each Nare Daily  . guaiFENesin  600 mg Oral BID  . levothyroxine  75 mcg Oral Q0600  . loratadine  10 mg Oral QPM  . mometasone-formoterol  2 puff Inhalation BID  . montelukast  10 mg Oral QHS  . oxymetazoline  1 spray Each Nare BID  . pantoprazole  40 mg Oral Daily  . polyethylene glycol  17 g Oral Daily  . sodium chloride flush  10-40 mL Intracatheter Q12H  . sodium chloride flush  3 mL Intravenous Q12H    Infusions: . sodium chloride 20 mL/hr at 07/28/20 1205  . sodium chloride    . heparin 1,250 Units/hr (08/09/20 0353)  . milrinone 0.25 mcg/kg/min (08/09/20 0748)    PRN Medications: sodium chloride, acetaminophen, alum & mag hydroxide-simeth, melatonin, morphine injection, ondansetron (ZOFRAN) IV, oxyCODONE, traZODone   Assessment/Plan:   1. Acute on chronic systolic HF -> cardiogenic shock - echo 3/19 EF 50-55% - echo 10/21 EF < 20% RV moderately reduced - echo 11/21 EF < 10-15 % RV moderately reduced mild to moderate MR - developed post MI shock with increasing lactate and AKI - Suspect initial decrease in EF from 3/19 -> 10/21 was nonischemic in nature (?PVCs) but no situation c/b large anterior infract with LIMA occlusion from cath films.  - Milrinone started 11/21 and stopped 11/24, restarted 11/26 with low co-ox.  - Continue milrinone 0.25, co-ox 70% today w/ CVP 5.  - No diuretics today, Cr improving.  - No ARB/ARNI with AKI.  - No b-blocker yet with shock  - He is not VAD candidate due to age, fraility and CKD. - Palliative care team following. Goal of care is to be home, and have more time to spend with family and to feel as good as he can for as long as he can. Planning d/c home w/ palliative milrinone. - will consult IR for tunneled PICC   2. RLE DVT - s/p IVC Filter  - On  heparin gtt, reports improvement in pain  but remains swollen. - Per IR, no plans for thrombectomy at this point - Plan transition to Eliquis for treatment of DVT after tunneled picc placed  3. CAD with acute anterior infarct due to thrombotic LIMA occlusion - Continue DAPT, statin - no B-blocker with shock - no chest pain.   4. AKI on CKD 3b - baseline creatinine 1.6-1.87. - due to ATN/shock - Creatinine up to 3.4, milrinone started back, now creatinine improving 2.2 today.  5. Thombocytopenia -resolved  6. Cough -hx of persistent cough outpt, had PFTs.  Reports improvement in cough.  - CT chest 11/4 showed interstitial pulmonary edema w/ small b/l pleural effusion; interstitial lung disease could not be evaluated; mild paraseptal emphysema - Completed 5 days of doxy for possible bronchitis.  7. Insomnia - continue trazodone  8. Leukocyctosis - Afebrile. - WBC peak 18.4 on 11/27, currently improving today WBC 13 - Completed 5 days of doxy for possible bronchitis.  - ? Related to DVT - Continue to monitor and trend.     Robbie Lis MD 08/09/2020, 11:15 AM  Advanced Heart Failure Team Pager [phone] (M-F; 7a - 4p)  Please contact [facility] for night-coverage after hours (4p -7a ) and weekends on [email or web address]  Patient seen and examined with the above-signed Advanced Practice Provider and/or Housestaff. I personally reviewed laboratory data, imaging studies and relevant notes. I independently examined the patient and formulated the important aspects of the plan. I have edited the note to reflect any of my changes or salient points. I have personally discussed the plan with the patient and/or family.  He remains on milrinone. He feels some better. Co-ox 70%. CVP 5. RLE less tight.   General: Elderly male. No resp difficulty HEENT: normal Neck: supple. no JVD. Carotids 2+ bilat; no bruits. No lymphadenopathy or thryomegaly appreciated. Cor: PMI nondisplaced. Regular rate & rhythm. No rubs, gallops or  murmurs. Lungs: clear Abdomen: soft, nontender, nondistended. No hepatosplenomegaly. No bruits or masses. Good bowel sounds. Extremities: no cyanosis, clubbing, rash, 1+ edema RLE Neuro: alert & orientedx3, cranial nerves grossly intact. moves all 4 extremities w/o difficulty. Affect pleasant  He is inotrope dependent. Will need tunneled cath for milirinone. RLE less tight. On heparin. No bleeding. Has IVC filter in place.   Will ask IR to place tunneled PICC for home milrinone [facility] team aware. Switch to Eliquis after PICC.   Arvilla Meres, MD  4:46 PM

## 2020-08-09 NOTE — Progress Notes (Signed)
Occupational Therapy Treatment Patient Details Name: Kevin Flores MRN: [id number] DOB: Jan 06, 1941 Today's Date: 08/09/2020    History of present illness 79 y.o. male with history of coronary artery disease status post remote CABG and prior PCI procedures.  He underwent cardiac catheterization 07/27/2020 because of progressive LV systolic dysfunction.  He was found to have no targets for PCI with recommendation for ongoing medical therapy.  At 10:00 on 07/28/2020 he developed acute onset of substernal chest pain and after arrival in the emergency department his EKG evolved into an anterior STEMI. Pt underwent emergent cardiac cath on 11/18 with balloon angioplasty.   OT comments  Patient continues to make steady progress towards goals in skilled OT session. Patient's session encompassed further reiteration of energy conservation strategies for discharge home, and ADLs sitting EOB, as pt requesting to mobilize with therapy later. Pt's main concern is managing medication at home, with therapist providing council and education that a nursing service will be providing care at the house to assist. Pt remains sore in RLE, but remains at supervision level for bed mobility to complete ADLs at EOB. Discharge remains appropriate, therapy will continue to follow.    Follow Up Recommendations  Supervision - Intermittent;No OT follow up    Equipment Recommendations  3 in 1 bedside commode    Recommendations for Other Services      Precautions / Restrictions Precautions Precautions: Fall Precaution Comments: watch 02 Restrictions Weight Bearing Restrictions: No       Mobility Bed Mobility Overal bed mobility: Needs Assistance Bed Mobility: Supine to Sit;Sit to Supine     Supine to sit: Supervision Sit to supine: Supervision      Transfers                 General transfer comment: Deferred till later in the afternoon    Balance Overall balance assessment: Needs  assistance Sitting-balance support: No upper extremity supported;Feet supported Sitting balance-Leahy Scale: Good                                     ADL either performed or assessed with clinical judgement   ADL Overall ADL's : Needs assistance/impaired     Grooming: Wash/dry hands;Wash/dry face;Oral care;Sitting Grooming Details (indicate cue type and reason): sitting EOB                             Functional mobility during ADLs: Min guard General ADL Comments: re-educated on energy conservation strategies, completed ADLs at EOB, wanting to ambulate later in the day     Vision       Perception     Praxis      Cognition Arousal/Alertness: Awake/alert Behavior During Therapy: WFL for tasks assessed/performed Overall Cognitive Status: Within Functional Limits for tasks assessed                                          Exercises     Shoulder Instructions       General Comments      Pertinent Vitals/ Pain       Faces Pain Scale: Hurts a little bit Pain Location: R Leg Pain Descriptors / Indicators: Sore Pain Intervention(s): Limited activity within patient's tolerance;Monitored during session;Repositioned  Home Living  Prior Functioning/Environment              Frequency  Min 2X/week        Progress Toward Goals  OT Goals(current goals can now be found in the care plan section)  Progress towards OT goals: Progressing toward goals  Acute Rehab OT Goals Patient Stated Goal: To return to baseline OT Goal Formulation: With patient Time For Goal Achievement: 08/15/20 Potential to Achieve Goals: Good  Plan Discharge plan remains appropriate    Co-evaluation                 AM-PAC OT "6 Clicks" Daily Activity     Outcome Measure   Help from another person eating meals?: None Help from another person taking care of personal grooming?: A  Little Help from another person toileting, which includes using toliet, bedpan, or urinal?: A Little Help from another person bathing (including washing, rinsing, drying)?: A Little Help from another person to put on and taking off regular upper body clothing?: None Help from another person to put on and taking off regular lower body clothing?: A Little 6 Click Score: 20    End of Session    OT Visit Diagnosis: Unsteadiness on feet (R26.81);Other abnormalities of gait and mobility (R26.89);Muscle weakness (generalized) (M62.81)   Activity Tolerance Patient tolerated treatment well   Patient Left in bed;with call bell/phone within reach   Nurse Communication Mobility status        Time: 1540-0867 OT Time Calculation (min): 22 min  Charges: OT General Charges $OT Visit: 1 Visit OT Treatments $Self Care/Home Management : 8-22 mins  Kevin Flores, COTA/L Acute Rehabilitation Services [phone]   Kevin Flores 08/09/2020, 11:50 AM

## 2020-08-10 LAB — COOXEMETRY PANEL
Carboxyhemoglobin: 1.5 % (ref 0.5–1.5)
Carboxyhemoglobin: 1.3 % (ref 0.5–1.5)
Carboxyhemoglobin: 1.6 % — ABNORMAL HIGH (ref 0.5–1.5)
Methemoglobin: 0.9 % (ref 0.0–1.5)
Methemoglobin: 1 % (ref 0.0–1.5)
Methemoglobin: 1.1 % (ref 0.0–1.5)
O2 Saturation: 48.3 %
O2 Saturation: 44.9 %
O2 Saturation: 61 %
Total hemoglobin: 13.7 g/dL (ref 12.0–16.0)
Total hemoglobin: 14.2 g/dL (ref 12.0–16.0)
Total hemoglobin: 13.7 g/dL (ref 12.0–16.0)

## 2020-08-10 LAB — CBC
HCT: 40.5 % (ref 39.0–52.0)
Hemoglobin: 13 g/dL (ref 13.0–17.0)
MCH: 29.7 pg (ref 26.0–34.0)
MCHC: 32.1 g/dL (ref 30.0–36.0)
MCV: 92.5 fL (ref 80.0–100.0)
Platelets: 209 10*3/uL (ref 150–400)
RBC: 4.38 MIL/uL (ref 4.22–5.81)
RDW: 14.8 % (ref 11.5–15.5)
WBC: 16.5 10*3/uL — ABNORMAL HIGH (ref 4.0–10.5)
nRBC: 0.1 % (ref 0.0–0.2)

## 2020-08-10 LAB — BASIC METABOLIC PANEL
Anion gap: 11 (ref 5–15)
BUN: 61 mg/dL — ABNORMAL HIGH (ref 8–23)
CO2: 25 mmol/L (ref 22–32)
Calcium: 9.1 mg/dL (ref 8.9–10.3)
Chloride: 100 mmol/L (ref 98–111)
Creatinine, Ser: 2.05 mg/dL — ABNORMAL HIGH (ref 0.61–1.24)
GFR, Estimated: 33 mL/min — ABNORMAL LOW (ref >60)
Glucose, Bld: 122 mg/dL — ABNORMAL HIGH (ref 70–99)
Potassium: 4.9 mmol/L (ref 3.5–5.1)
Sodium: 136 mmol/L (ref 135–145)

## 2020-08-10 LAB — MAGNESIUM: Magnesium: 2.7 mg/dL — ABNORMAL HIGH (ref 1.7–2.4)

## 2020-08-10 MED ORDER — FUROSEMIDE 10 MG/ML IJ SOLN
40.0000 mg | Freq: Once | INTRAMUSCULAR | Status: AC
  Administered 2020-08-10: 40 mg via INTRAVENOUS
  Filled 2020-08-10: qty 4

## 2020-08-10 MED ORDER — MILRINONE LACTATE IN DEXTROSE 20-5 MG/100ML-% IV SOLN
0.3750 ug/kg/min | INTRAVENOUS | Status: DC
  Administered 2020-08-10 – 2020-08-11 (×3): 0.375 ug/kg/min via INTRAVENOUS
  Filled 2020-08-10 (×3): qty 100

## 2020-08-10 NOTE — TOC Progression Note (Addendum)
Transition of Care [facility]) - Progression Note    Patient Details  Name: Destiny Trickey MRN: [id number] Date of Birth: May 22, 1941  Transition of [facility]) CM/SW Contact  Leone Haven, RN Phone Number: 08/10/2020, 4:12 PM  Clinical Narrative:    NCM received call from Jeri Modena stating she spoke with Vernona Rieger PA and patient and family has decided on going home with Hospice with [facility] with milrinone.  NCM made referral to Jamal Maes with [facility], also informed Pearson Grippe with [facility] to cancel [facility], patient will need home oxygen , [facility] will supply DME, hospital bed, bedside table and bsc.  Patient is going by ambulance and Jamal Maes will let this NCM know when DME has been delivered so that transport can be called.     Barriers to Discharge: Continued Medical Work up  Expected Discharge Plan and Services                             DME Agency: NA       HH Arranged: RN HH Agency: [facility] Date HH Agency Contacted: 08/08/20 Time HH Agency Contacted: 1259 Representative spoke with at [facility] Agency: Pearson Grippe   Social Determinants of Health (SDOH) Interventions    Readmission Risk Interventions No flowsheet data found.

## 2020-08-10 NOTE — Progress Notes (Signed)
SATURATION QUALIFICATIONS: (This note is used to comply with regulatory documentation for home oxygen)  Patient Saturations on Room Air at Rest = 84%  Patient Saturations on Room Air while Ambulating = 0%  Patient Saturations on 0 Liters of oxygen while Ambulating = 0%  Please briefly explain why patient needs home oxygen:  Patient end stage heart failure with increasing milrinone needs.  Extreme SOB with any exertion.  Moving about in bed to sit up and eat requires prolonged rest period to catch up on breath. Unable to ambulate at this time

## 2020-08-10 NOTE — Progress Notes (Signed)
Daily Progress Note   Patient Name: Kevin Flores       Date: 08/10/2020 DOB: 1941/01/29  Age: 79 y.o. MRN#: [id number] Attending Physician: Tonny Bollman, MD Primary Care Physician: Junie Spencer, FNP Admit Date: 07/28/2020  Reason for Follow-up: GOC, psychosocial support, disposition  Subjective: Patient feels overall worse today. Milrinone has been increased and he has required oxygen again. Wife and son are at bedside. Patient and family has been updated by heart failure team that prognosis is limited.  Patient is tearful and expresses he wants to go home as soon as possible. I provided emotional support and reassurance that the team was working hard to make this happen.   Length of Stay: 13  Current Medications: Scheduled Meds:  . allopurinol  300 mg Oral Daily  . apixaban  10 mg Oral BID   Followed by  . [START ON 08/16/2020] apixaban  5 mg Oral BID  . aspirin  81 mg Oral Daily  . atorvastatin  20 mg Oral Daily  . Chlorhexidine Gluconate Cloth  6 each Topical Daily  . docusate sodium  100 mg Oral BID  . fluticasone  1 spray Each Nare Daily  . guaiFENesin  600 mg Oral BID  . levothyroxine  75 mcg Oral Q0600  . loratadine  10 mg Oral QPM  . mometasone-formoterol  2 puff Inhalation BID  . montelukast  10 mg Oral QHS  . oxymetazoline  1 spray Each Nare BID  . pantoprazole  40 mg Oral Daily  . polyethylene glycol  17 g Oral Daily  . sodium chloride flush  10-40 mL Intracatheter Q12H  . sodium chloride flush  3 mL Intravenous Q12H    Continuous Infusions: . sodium chloride 20 mL/hr at 07/28/20 1205  . sodium chloride    . milrinone 0.375 mcg/kg/min (08/10/20 1131)    PRN Meds: sodium chloride, acetaminophen, alum & mag hydroxide-simeth, lidocaine (PF), melatonin,  morphine injection, ondansetron (ZOFRAN) IV, oxyCODONE, sodium chloride, traZODone  Physical Exam Vitals reviewed.  Cardiovascular:     Comments: SR with PVCs Pulmonary:     Effort: Pulmonary effort is normal.  Neurological:     Mental Status: He is alert and oriented to person, place, and time.             Vital Signs: BP 109/84 (BP  Location: Left Arm)   Pulse (!) 108   Temp 98 F (36.7 C) (Oral)   Resp 17   Ht 5\' 9"  (1.753 m)   Wt 81.5 kg   SpO2 90%   BMI 26.53 kg/m  SpO2: SpO2: 90 % O2 Device: O2 Device: Nasal Cannula O2 Flow Rate: O2 Flow Rate (L/min): 2 L/min  Intake/output summary:   Intake/Output Summary (Last 24 hours) at 08/10/2020 1447 Last data filed at 08/10/2020 1300 Gross per 24 hour  Intake 577.39 ml  Output 1200 ml  Net -622.61 ml   LBM: Last BM Date: 08/08/20 Baseline Weight: Weight: 86.2 kg Most recent weight: Weight: 81.5 kg       Palliative Assessment/Data: PPS 40%        Palliative Care Assessment & Plan   HPI/Patient Profile:79 y.o.malewith past medical history of CAD s/p remote CABG and prior PCI procedures, hypertension, hyperlipidemia, and chronic kidney disease stage 3b. He underwent cardiac catheterization on 11/17 due to progressive LV systolic dysfunction with EF < 20%. He was found to have severe 3 vessel CAD with no targets for PCI and recommendation for ongoing medical therapy. On11/18/2021he developed acute onset of substernal chest pain, and after arrival in the emergency department his EKG evolved into an anterior STEMI.Cardiac cath showed occluded LIMA-LAD which was unable to be vascularized.Hospitalization has been complicated by cardiogenic shock and RUE DVT. Patient is not a candidate for LVAD due to advanced age and chronic kidney disease Palliative Medicine has been consulted to assist with goals of care in the setting of advanced HF and consideration of home milrinone.   11/19- cardiogenic shock, low output HF with  lactate 2.3 and AKI, started on milrinone 11/25- milrinone stopped 11/26- milrinone re-started, also found to have RUE DVT - started on heparin gtt and IVC filter placed  Assessment: - acute on chronic systolic HF - cardiogenic shock - CAD with acute anterior infarct  - AKI on CKD 3b - RLE DVT - insomnia  Recommendations/Plan: - DNR/DNI as previously documented - patient wants to go home as soon as possible - unrestricted visitation status  Goals of Care and Additional Recommendations (from MOST completed 08/09/20):  Cardiopulmonary Resuscitation: Do Not Attempt Resuscitation (DNR/No CPR)  Medical Interventions: Limited Additional Interventions: Use medical treatment, IV fluids and cardiac monitoring as indicated. DO NOT USE intubation or mechanical ventilation. May consider use of less invasive airway support such as BiPAP or CPAP. Also provide comfort measures. Transfer to the hospital if indicated. Avoid intensive care.  Antibiotics: - Antibiotic if indicated  IV Fluids: - IV fluids for a defined trial period  Feeding Tube: - Feeding tube for a defined trial period     Code Status:  DNR  Prognosis:  weeks  Discharge Planning:  Home with home health and milrinone  Care plan was discussed with nursing  Thank you for allowing the Palliative Medicine Team to assist in the care of this patient.   Total Time 15 minutes Prolonged Time Billed  no       Greater than 50%  of this time was spent counseling and coordinating care related to the above assessment and plan.  08/11/20, NP  Please contact Palliative Medicine Team phone at [phone] for questions and concerns.

## 2020-08-10 NOTE — Progress Notes (Signed)
1351 Talked with pt's RN. Family coming in for meeting with pt. Will hold ambulation and continue to follow. Luetta Nutting RN BSN 08/10/2020 1:53 PM

## 2020-08-10 NOTE — Progress Notes (Signed)
PT Cancellation Note  Patient Details Name: Kevin Flores MRN: [id number] DOB: 10/01/1940   Cancelled Treatment:    Reason Eval/Treat Not Completed: Medical issues which prohibited therapy (Pt to have family conference will pallative will defer PT needs at this time and follow up per POC.)   Elmina Hendel Artis Delay 08/10/2020, 3:21 PM  Bonney Leitz , PTA Acute Rehabilitation Services Pager [phone] Office [phone]

## 2020-08-10 NOTE — Progress Notes (Signed)
Spoke w/ family (wife, 3 daughters, 1 son and daughter-in-law) and Mr. Lazarz to goal is to go home tomorrow on hospice. Updated on care plan.  Called and updated Pam on plan to go home with hospice on milrinone. Order placed for home O2 as well.

## 2020-08-10 NOTE — Progress Notes (Addendum)
Patient ID: Kevin Flores, male   DOB: 04/07/1941, 79 y.o.   MRN: [id number]    Advanced Heart Failure Rounding Note   Subjective:    Started on milrinone 11/19 for low output HF/shock. Milrinone stopped 11/25.  Co-ox dropped and creatinine rose, milrinone restarted 11/26.  Placed on heparin gtt and IVC filter placed on 11/26 for extensive RLE DVT. Tunnel CVC placed 11/30.   Currently on milrinone 0.25 with co-ox 45% and CVP 11 today. In addition, long runs of NSVT on telemetry overnight.  Denies CP, palpitations, shortness of breath but reports has tons of congestion makes hard to breath through nose.  Denies facial pain or swelling.  Also reports not sleeping well which is causing fatigue.     Objective:   Weight Range:  Vital Signs:   Temp:  [97.6 F (36.4 C)-98.3 F (36.8 C)] 98 F (36.7 C) (12/01 0341) Pulse Rate:  [105-200] 107 (12/01 0341) Resp:  [15-25] 20 (12/01 0341) BP: (103-118)/(76-89) 109/84 (12/01 0341) SpO2:  [85 %-100 %] 91 % (12/01 0341) Weight:  [81.5 kg] 81.5 kg (12/01 0341) Last BM Date: 08/08/20  Weight change: Filed Weights   08/08/20 0356 08/09/20 0356 08/10/20 0341  Weight: 84.8 kg 81.3 kg 81.5 kg    Intake/Output:   Intake/Output Summary (Last 24 hours) at 08/10/2020 0714 Last data filed at 08/10/2020 0341 Gross per 24 hour  Intake 577.39 ml  Output 1925 ml  Net -1347.61 ml   CVP 11 General:  Weak appearing. No resp difficulty HEENT: normal Neck: supple. + JVD. Carotids 2+ bilat; no bruits. No lymphadenopathy or thryomegaly appreciated. Cor: PMI nondisplaced. Tachy with additional beats. No rubs, gallops or murmurs. Lungs: Diminished to RLL.  Abdomen: soft, nontender, nondistended. No hepatosplenomegaly. No bruits or masses. Good bowel sounds. Extremities: no cyanosis, clubbing, rash.  Decrease swelling to RLE (TEDS on).  Neuro: alert & oriented x 3, cranial nerves grossly intact. moves all 4 extremities w/o difficulty. Affect  pleasant.  Telemetry:  ST with PVCs and NSVT.  Rates 110s.  Personally reviewed.    Labs: Basic Metabolic Panel: Recent Labs  Lab 08/03/20 0809 08/04/20 0351 08/06/20 0349 08/06/20 0349 08/07/20 0325 08/07/20 0325 08/08/20 0221 08/08/20 2217 08/09/20 0315 08/10/20 0406  NA  --    < > 136  --  134*  --  134*  --  135 136  K  --    < > 4.2  --  4.4  --  4.3  --  4.4 4.9  CL  --    < > 94*  --  95*  --  95*  --  95* 100  CO2  --    < > 26  --  27  --  26  --  26 25  GLUCOSE  --    < > 139*  --  134*  --  127*  --  121* 122*  BUN  --    < > 95*  --  90*  --  79*  --  71* 61*  CREATININE  --    < > 3.42*  --  2.92*  --  2.42*  --  2.20* 2.05*  CALCIUM  --    < > 9.1   < > 9.0   < > 8.9  --  9.0 9.1  MG 2.4  --   --   --   --   --   --  2.6*  --   --    < > =  values in this interval not displayed.    Liver Function Tests: No results for input(s): AST, ALT, ALKPHOS, BILITOT, PROT, ALBUMIN in the last 168 hours. No results for input(s): LIPASE, AMYLASE in the last 168 hours. No results for input(s): AMMONIA in the last 168 hours.  CBC: Recent Labs  Lab 08/06/20 0349 08/07/20 0325 08/08/20 0221 08/09/20 0315 08/10/20 0406  WBC 18.4* 18.1* 15.5* 13.5* 16.5*  HGB 14.0 13.7 13.4 12.8* 13.0  HCT 43.2 42.0 40.9 39.5 40.5  MCV 91.3 90.7 90.1 91.2 92.5  PLT 167 171 196 211 209    Cardiac Enzymes: No results for input(s): CKTOTAL, CKMB, CKMBINDEX, TROPONINI in the last 168 hours.  BNP: BNP (last 3 results) Recent Labs    06/09/20 1012 07/29/20 1643  BNP 774.8* 3,099.8*    ProBNP (last 3 results) No results for input(s): PROBNP in the last 8760 hours.    Other results:  Imaging: IR Fluoro Guide CV Line Right  Result Date: 08/09/2020 CLINICAL DATA:  Heart failure and need for tunneled central venous catheter for continuous IV Milrinone therapy prior to discharge from the hospital. EXAM: TUNNELED CENTRAL VENOUS HEMODIALYSIS CATHETER PLACEMENT WITH ULTRASOUND AND  FLUOROSCOPIC GUIDANCE ANESTHESIA/SEDATION: None MEDICATIONS: None FLUOROSCOPY TIME:  18 seconds.  2.0 mGy. PROCEDURE: The procedure, risks, benefits, and alternatives were explained to the patient. Questions regarding the procedure were encouraged and answered. The patient understands and consents to the procedure. A timeout was performed prior to initiating the procedure. The right neck and chest were prepped with chlorhexidine in a sterile fashion, and a sterile drape was applied covering the operative field. Maximum barrier sterile technique with sterile gowns and gloves were used for the procedure. Local anesthesia was provided with 1% lidocaine. Ultrasound was used to confirm patency of the right internal jugular vein. After creating a small venotomy incision, a 21 gauge needle was advanced into the right internal jugular vein under direct, real-time ultrasound guidance. Ultrasound image documentation was performed. After securing guidewire access, a 6 French peel-away sheath was placed. A wire was kinked to measure appropriate catheter length. A 6 French, dual-lumen power line tunneled central venous catheter was chosen for placement. This was tunneled in a retrograde fashion from the chest wall to the venotomy incision. The catheter was cut to 24 cm based on guidewire measurement. The catheter was then placed through the peel-away sheath and the sheath removed. Final catheter positioning was confirmed and documented with a fluoroscopic spot image. The catheter was aspirated and flushed with saline. The venotomy incision was closed with subcuticular 4-0 Vicryl. Dermabond was applied to the incision. The catheter exit site was secured with Prolene retention sutures. COMPLICATIONS: None.  No pneumothorax. FINDINGS: After catheter placement, the tip lies at the SVC/RA junction. The catheter aspirates normally and is ready for immediate use. IMPRESSION: Placement of tunneled central venous catheter via the right  internal jugular vein. The catheter tip lies at the SVC/RA junction. The catheter is ready for immediate use. Electronically Signed   By: Irish Lack M.D.   On: 08/09/2020 16:48   IR US Guide Vasc Access Right  Result Date: 08/09/2020 CLINICAL DATA:  Heart failure and need for tunneled central venous catheter for continuous IV Milrinone therapy prior to discharge from the hospital. EXAM: TUNNELED CENTRAL VENOUS HEMODIALYSIS CATHETER PLACEMENT WITH ULTRASOUND AND FLUOROSCOPIC GUIDANCE ANESTHESIA/SEDATION: None MEDICATIONS: None FLUOROSCOPY TIME:  18 seconds.  2.0 mGy. PROCEDURE: The procedure, risks, benefits, and alternatives were explained to the patient. Questions regarding the  procedure were encouraged and answered. The patient understands and consents to the procedure. A timeout was performed prior to initiating the procedure. The right neck and chest were prepped with chlorhexidine in a sterile fashion, and a sterile drape was applied covering the operative field. Maximum barrier sterile technique with sterile gowns and gloves were used for the procedure. Local anesthesia was provided with 1% lidocaine. Ultrasound was used to confirm patency of the right internal jugular vein. After creating a small venotomy incision, a 21 gauge needle was advanced into the right internal jugular vein under direct, real-time ultrasound guidance. Ultrasound image documentation was performed. After securing guidewire access, a 6 French peel-away sheath was placed. A wire was kinked to measure appropriate catheter length. A 6 French, dual-lumen power line tunneled central venous catheter was chosen for placement. This was tunneled in a retrograde fashion from the chest wall to the venotomy incision. The catheter was cut to 24 cm based on guidewire measurement. The catheter was then placed through the peel-away sheath and the sheath removed. Final catheter positioning was confirmed and documented with a fluoroscopic spot  image. The catheter was aspirated and flushed with saline. The venotomy incision was closed with subcuticular 4-0 Vicryl. Dermabond was applied to the incision. The catheter exit site was secured with Prolene retention sutures. COMPLICATIONS: None.  No pneumothorax. FINDINGS: After catheter placement, the tip lies at the SVC/RA junction. The catheter aspirates normally and is ready for immediate use. IMPRESSION: Placement of tunneled central venous catheter via the right internal jugular vein. The catheter tip lies at the SVC/RA junction. The catheter is ready for immediate use. Electronically Signed   By: Irish Lack M.D.   On: 08/09/2020 16:48     Medications:     Scheduled Medications: . allopurinol  300 mg Oral Daily  . apixaban  10 mg Oral BID   Followed by  . [START ON 08/16/2020] apixaban  5 mg Oral BID  . aspirin  81 mg Oral Daily  . atorvastatin  20 mg Oral Daily  . Chlorhexidine Gluconate Cloth  6 each Topical Daily  . clopidogrel  75 mg Oral Daily  . docusate sodium  100 mg Oral BID  . fluticasone  1 spray Each Nare Daily  . guaiFENesin  600 mg Oral BID  . levothyroxine  75 mcg Oral Q0600  . loratadine  10 mg Oral QPM  . mometasone-formoterol  2 puff Inhalation BID  . montelukast  10 mg Oral QHS  . oxymetazoline  1 spray Each Nare BID  . pantoprazole  40 mg Oral Daily  . polyethylene glycol  17 g Oral Daily  . sodium chloride flush  10-40 mL Intracatheter Q12H  . sodium chloride flush  3 mL Intravenous Q12H    Infusions: . sodium chloride 20 mL/hr at 07/28/20 1205  . sodium chloride    . milrinone 0.25 mcg/kg/min (08/09/20 2033)    PRN Medications: sodium chloride, acetaminophen, alum & mag hydroxide-simeth, lidocaine (PF), melatonin, morphine injection, ondansetron (ZOFRAN) IV, oxyCODONE, sodium chloride, traZODone   Assessment/Plan:   1. Acute on chronic systolic HF -> cardiogenic shock - echo 3/19 EF 50-55% - echo 10/21 EF < 20% RV moderately reduced -  echo 11/21 EF < 10-15 % RV moderately reduced mild to moderate MR - developed post MI shock with increasing lactate and AKI - Suspect initial decrease in EF from 3/19 -> 10/21 was nonischemic in nature (?PVCs) but no situation c/b large anterior infract with LIMA occlusion from  cath films.  - Milrinone started 11/21 and stopped 11/24, restarted 11/26 with low co-ox. Tunnel CVC placed 11/30.  - On milrinone 0.25, co-ox 45% w/ CVP 11, Cr improving. - Will give lasix 40 mg IV x 1 (SBP 110s) and increase milrinone 0.375 mcg.  - No ARB/ARNI with AKI.  - No b-blocker yet with shock  - He is not VAD candidate due to age, fraility and CKD. - Palliative care team following. Goal of care is to be home, and have more time to spend with family and to feel as good as he can for as long as he can. Planning d/c home w/ palliative milrinone.  2. RLE DVT - s/p IVC Filter  - On heparin gtt, reports improvement in pain but remains swollen. - Per IR, no plans for thrombectomy at this point - Continue with eliquis.   3. CAD with acute anterior infarct due to thrombotic LIMA occlusion - Continue ASA, statin (d/c plavix while on eliquis; appears to have been on high statin dose in the past).  - no B-blocker with shock - no chest pain.   4. AKI on CKD 3b - baseline creatinine 1.6-1.87. - due to ATN/shock - Creatinine up to 3.4, milrinone started back, now creatinine improving 2.0.  5. Thombocytopenia -resolved  6. Cough -hx of persistent cough outpt, had PFTs.  Reports improvement in cough.  - CT chest 11/4 showed interstitial pulmonary edema w/ small b/l pleural effusion; interstitial lung disease could not be evaluated; mild paraseptal emphysema - Completed 5 days of doxy for possible bronchitis.  7. Insomnia - continue trazodone  8. Leukocyctosis - Afebrile. - WBC peak 18.4 on 11/27, WBC did drop to 13.5 but now back to 16.5 today.  - Completed 5 days of doxy for possible bronchitis.  - ?  Related to DVT - Continue to monitor and trend.    9.  NSVT  -K 4.9, Mg pending - monitor on telemetry   Terance IceLaura E Liggett MD 08/10/2020, 7:14 AM  Advanced Heart Failure Team Pager [phone] (M-F; 7a - 4p)  Please contact [facility] for night-coverage after hours (4p -7a ) and weekends on [email or web address]  Patient seen and examined with the above-signed Advanced Practice Provider and/or Housestaff. I personally reviewed laboratory data, imaging studies and relevant notes. I independently examined the patient and formulated the important aspects of the plan. I have edited the note to reflect any of my changes or salient points. I have personally discussed the plan with the patient and/or family.  Remains on milrinone. Co-ox dropped into 40s. CVP up. More SOB. Feels weak.On apixaban for DVT.   General:  Elderly fatigued No resp difficulty HEENT: normal Neck: supple. + JVP to jaw . Carotids 2+ bilat; no bruits. No lymphadenopathy or thryomegaly appreciated. Cor: PMI nondisplaced. Regular rate & rhythm. +s3 Lungs: clear Abdomen: soft, nontender, nondistended. No hepatosplenomegaly. No bruits or masses. Good bowel sounds. Extremities: no cyanosis, clubbing, rash, 2+ RLE edema Neuro: alert & orientedx3, cranial nerves grossly intact. moves all 4 extremities w/o difficulty. Affect pleasant  He unfortunately seems to he end-stage HF. Co-ox dropping despite milrinone support. We had long discussion about options of home hospice vs hospice facility. He is meeting with Palliative Care.  We have increased milrinone to 0.375.  Arvilla Meresaniel Mikya Don, MD  4:07 PM

## 2020-08-10 NOTE — Progress Notes (Signed)
Assessment, VS and FSBS deferred due to patient meeting with family regarding patient wishes to go home with Hospice.

## 2020-08-11 ENCOUNTER — Other Ambulatory Visit: Payer: Self-pay | Admitting: Family

## 2020-08-11 LAB — URINALYSIS, COMPLETE (UACMP) WITH MICROSCOPIC
Bilirubin Urine: NEGATIVE
Glucose, UA: NEGATIVE mg/dL
Ketones, ur: NEGATIVE mg/dL
Nitrite: NEGATIVE
Protein, ur: 100 mg/dL — AB
RBC / HPF: >50 RBC/hpf — ABNORMAL HIGH (ref 0–5)
Specific Gravity, Urine: 1.008 (ref 1.005–1.030)
pH: 7 (ref 5.0–8.0)

## 2020-08-11 LAB — CBC
HCT: 38.4 % — ABNORMAL LOW (ref 39.0–52.0)
Hemoglobin: 12.4 g/dL — ABNORMAL LOW (ref 13.0–17.0)
MCH: 29.8 pg (ref 26.0–34.0)
MCHC: 32.3 g/dL (ref 30.0–36.0)
MCV: 92.3 fL (ref 80.0–100.0)
Platelets: 208 10*3/uL (ref 150–400)
RBC: 4.16 MIL/uL — ABNORMAL LOW (ref 4.22–5.81)
RDW: 14.6 % (ref 11.5–15.5)
WBC: 21.8 10*3/uL — ABNORMAL HIGH (ref 4.0–10.5)
nRBC: 0 % (ref 0.0–0.2)

## 2020-08-11 LAB — BASIC METABOLIC PANEL
Anion gap: 15 (ref 5–15)
BUN: 60 mg/dL — ABNORMAL HIGH (ref 8–23)
CO2: 25 mmol/L (ref 22–32)
Calcium: 9.3 mg/dL (ref 8.9–10.3)
Chloride: 97 mmol/L — ABNORMAL LOW (ref 98–111)
Creatinine, Ser: 2.24 mg/dL — ABNORMAL HIGH (ref 0.61–1.24)
GFR, Estimated: 29 mL/min — ABNORMAL LOW (ref >60)
Glucose, Bld: 119 mg/dL — ABNORMAL HIGH (ref 70–99)
Potassium: 4.7 mmol/L (ref 3.5–5.1)
Sodium: 137 mmol/L (ref 135–145)

## 2020-08-11 LAB — COOXEMETRY PANEL
Carboxyhemoglobin: 1.8 % — ABNORMAL HIGH (ref 0.5–1.5)
Methemoglobin: 1 % (ref 0.0–1.5)
O2 Saturation: 49.1 %
Total hemoglobin: 12.9 g/dL (ref 12.0–16.0)

## 2020-08-11 MED ORDER — MILRINONE LACTATE IN DEXTROSE 20-5 MG/100ML-% IV SOLN: 0.3750 ug/kg/min | INTRAVENOUS | Status: AC

## 2020-08-11 MED ORDER — SALINE SPRAY 0.65 % NA SOLN: 1.0000 | NASAL | 0 refills | Status: AC | PRN

## 2020-08-11 MED ORDER — CEFDINIR 300 MG PO CAPS
300.0000 mg | ORAL_CAPSULE | Freq: Every day | ORAL | Status: DC
  Administered 2020-08-11: 300 mg via ORAL
  Filled 2020-08-11: qty 1

## 2020-08-11 MED ORDER — APIXABAN 5 MG PO TABS: 10.0000 mg | ORAL_TABLET | Freq: Two times a day (BID) | ORAL | 0 refills | Status: AC

## 2020-08-11 MED ORDER — CEFDINIR 300 MG PO CAPS: 300.0000 mg | ORAL_CAPSULE | Freq: Every day | ORAL | 0 refills | Status: AC

## 2020-08-11 MED ORDER — APIXABAN 5 MG PO TABS: 5.0000 mg | ORAL_TABLET | Freq: Two times a day (BID) | ORAL | 3 refills | Status: AC

## 2020-08-11 MED ORDER — FUROSEMIDE 20 MG PO TABS: 40.0000 mg | ORAL_TABLET | Freq: Every day | ORAL | 1 refills | Status: AC

## 2020-08-11 NOTE — Progress Notes (Addendum)
Patient ID: Kevin Flores, male   DOB: Dec 03, 1940, 79 y.o.   MRN: [id number]    Advanced Heart Failure Rounding Note   Subjective:    Started on milrinone 11/19 for low output HF/shock. Milrinone stopped 11/25.  Co-ox dropped and creatinine rose, milrinone restarted 11/26.  Placed on heparin gtt and IVC filter placed on 11/26 for extensive RLE DVT. Tunnel CVC placed 11/30. 12/1 Increased milrinone to 0.375 due to co-ox 45%, patient and family agreeable to go home with hospice.   Today: Co-ox 49%, CVP 9, main complaint is congestion and pulled dry clot from L nare that caused bleeding.  Denies facial pain, cough, chest pain or shortness of breath. Reports sleeping well last night.      Objective:   Weight Range:  Vital Signs:   Temp:  [98 F (36.7 C)-98.5 F (36.9 C)] 98 F (36.7 C) (12/02 0343) Pulse Rate:  [108-115] 113 (12/02 0343) Resp:  [15-23] 19 (12/02 0343) BP: (96-116)/(74-84) 103/74 (12/02 0343) SpO2:  [90 %-97 %] 93 % (12/02 0343) FiO2 (%):  [28 %] 28 % (12/01 2059) Weight:  [80.4 kg] 80.4 kg (12/02 0632) Last BM Date: 08/10/20  Weight change: Filed Weights   08/09/20 0356 08/10/20 0341 08/11/20 3875  Weight: 81.3 kg 81.5 kg 80.4 kg    Intake/Output:   Intake/Output Summary (Last 24 hours) at 08/11/2020 0700 Last data filed at 08/11/2020 0600 Gross per 24 hour  Intake 761.22 ml  Output 2325 ml  Net -1563.78 ml   General:  Happy appearing. No resp difficulty HEENT: bleeding able to be controlled to L nare with pressure Neck: supple. + JVD.  Cor: PMI nondisplaced. Tachycardic rate & rhythm. No rubs, gallops or murmurs. Lungs: clear Abdomen: soft, nontender, nondistended. Good bowel sounds. Foley present.  Extremities: no cyanosis, clubbing, rash.  Decrease swelling to RLE.  Neuro: alert & oriented x 3, moves all 4 extremities w/o difficulty. Affect pleasant   Telemetry:  Several episodes of NSVT x 6 beats.  Rates 110s.  Personally reviewed.    Labs: Basic  Metabolic Panel: Recent Labs  Lab 08/07/20 0325 08/07/20 0325 08/08/20 0221 08/08/20 0221 08/08/20 2217 08/09/20 0315 08/10/20 0406 08/10/20 0806 08/11/20 0345  NA 134*  --  134*  --   --  135 136  --  137  K 4.4  --  4.3  --   --  4.4 4.9  --  4.7  CL 95*  --  95*  --   --  95* 100  --  97*  CO2 27  --  26  --   --  26 25  --  25  GLUCOSE 134*  --  127*  --   --  121* 122*  --  119*  BUN 90*  --  79*  --   --  71* 61*  --  60*  CREATININE 2.92*  --  2.42*  --   --  2.20* 2.05*  --  2.24*  CALCIUM 9.0   < > 8.9   < >  --  9.0 9.1  --  9.3  MG  --   --   --   --  2.6*  --   --  2.7*  --    < > = values in this interval not displayed.    Liver Function Tests: No results for input(s): AST, ALT, ALKPHOS, BILITOT, PROT, ALBUMIN in the last 168 hours. No results for input(s): LIPASE, AMYLASE in the last 168  hours. No results for input(s): AMMONIA in the last 168 hours.  CBC: Recent Labs  Lab 08/07/20 0325 08/08/20 0221 08/09/20 0315 08/10/20 0406 08/11/20 0345  WBC 18.1* 15.5* 13.5* 16.5* 21.8*  HGB 13.7 13.4 12.8* 13.0 12.4*  HCT 42.0 40.9 39.5 40.5 38.4*  MCV 90.7 90.1 91.2 92.5 92.3  PLT 171 196 211 209 208    Cardiac Enzymes: No results for input(s): CKTOTAL, CKMB, CKMBINDEX, TROPONINI in the last 168 hours.  BNP: BNP (last 3 results) Recent Labs    06/09/20 1012 07/29/20 1643  BNP 774.8* 3,099.8*    ProBNP (last 3 results) No results for input(s): PROBNP in the last 8760 hours.    Other results:  Imaging: IR Fluoro Guide CV Line Right  Result Date: 08/09/2020 CLINICAL DATA:  Heart failure and need for tunneled central venous catheter for continuous IV Milrinone therapy prior to discharge from the hospital. EXAM: TUNNELED CENTRAL VENOUS HEMODIALYSIS CATHETER PLACEMENT WITH ULTRASOUND AND FLUOROSCOPIC GUIDANCE ANESTHESIA/SEDATION: None MEDICATIONS: None FLUOROSCOPY TIME:  18 seconds.  2.0 mGy. PROCEDURE: The procedure, risks, benefits, and alternatives  were explained to the patient. Questions regarding the procedure were encouraged and answered. The patient understands and consents to the procedure. A timeout was performed prior to initiating the procedure. The right neck and chest were prepped with chlorhexidine in a sterile fashion, and a sterile drape was applied covering the operative field. Maximum barrier sterile technique with sterile gowns and gloves were used for the procedure. Local anesthesia was provided with 1% lidocaine. Ultrasound was used to confirm patency of the right internal jugular vein. After creating a small venotomy incision, a 21 gauge needle was advanced into the right internal jugular vein under direct, real-time ultrasound guidance. Ultrasound image documentation was performed. After securing guidewire access, a 6 French peel-away sheath was placed. A wire was kinked to measure appropriate catheter length. A 6 French, dual-lumen power line tunneled central venous catheter was chosen for placement. This was tunneled in a retrograde fashion from the chest wall to the venotomy incision. The catheter was cut to 24 cm based on guidewire measurement. The catheter was then placed through the peel-away sheath and the sheath removed. Final catheter positioning was confirmed and documented with a fluoroscopic spot image. The catheter was aspirated and flushed with saline. The venotomy incision was closed with subcuticular 4-0 Vicryl. Dermabond was applied to the incision. The catheter exit site was secured with Prolene retention sutures. COMPLICATIONS: None.  No pneumothorax. FINDINGS: After catheter placement, the tip lies at the SVC/RA junction. The catheter aspirates normally and is ready for immediate use. IMPRESSION: Placement of tunneled central venous catheter via the right internal jugular vein. The catheter tip lies at the SVC/RA junction. The catheter is ready for immediate use. Electronically Signed   By: Irish Lack M.D.   On:  08/09/2020 16:48   IR US Guide Vasc Access Right  Result Date: 08/09/2020 CLINICAL DATA:  Heart failure and need for tunneled central venous catheter for continuous IV Milrinone therapy prior to discharge from the hospital. EXAM: TUNNELED CENTRAL VENOUS HEMODIALYSIS CATHETER PLACEMENT WITH ULTRASOUND AND FLUOROSCOPIC GUIDANCE ANESTHESIA/SEDATION: None MEDICATIONS: None FLUOROSCOPY TIME:  18 seconds.  2.0 mGy. PROCEDURE: The procedure, risks, benefits, and alternatives were explained to the patient. Questions regarding the procedure were encouraged and answered. The patient understands and consents to the procedure. A timeout was performed prior to initiating the procedure. The right neck and chest were prepped with chlorhexidine in a sterile fashion, and a  sterile drape was applied covering the operative field. Maximum barrier sterile technique with sterile gowns and gloves were used for the procedure. Local anesthesia was provided with 1% lidocaine. Ultrasound was used to confirm patency of the right internal jugular vein. After creating a small venotomy incision, a 21 gauge needle was advanced into the right internal jugular vein under direct, real-time ultrasound guidance. Ultrasound image documentation was performed. After securing guidewire access, a 6 French peel-away sheath was placed. A wire was kinked to measure appropriate catheter length. A 6 French, dual-lumen power line tunneled central venous catheter was chosen for placement. This was tunneled in a retrograde fashion from the chest wall to the venotomy incision. The catheter was cut to 24 cm based on guidewire measurement. The catheter was then placed through the peel-away sheath and the sheath removed. Final catheter positioning was confirmed and documented with a fluoroscopic spot image. The catheter was aspirated and flushed with saline. The venotomy incision was closed with subcuticular 4-0 Vicryl. Dermabond was applied to the incision. The  catheter exit site was secured with Prolene retention sutures. COMPLICATIONS: None.  No pneumothorax. FINDINGS: After catheter placement, the tip lies at the SVC/RA junction. The catheter aspirates normally and is ready for immediate use. IMPRESSION: Placement of tunneled central venous catheter via the right internal jugular vein. The catheter tip lies at the SVC/RA junction. The catheter is ready for immediate use. Electronically Signed   By: Irish LackGlenn  Yamagata M.D.   On: 08/09/2020 16:48     Medications:     Scheduled Medications: . allopurinol  300 mg Oral Daily  . apixaban  10 mg Oral BID   Followed by  . [START ON 08/16/2020] apixaban  5 mg Oral BID  . aspirin  81 mg Oral Daily  . atorvastatin  20 mg Oral Daily  . Chlorhexidine Gluconate Cloth  6 each Topical Daily  . docusate sodium  100 mg Oral BID  . fluticasone  1 spray Each Nare Daily  . guaiFENesin  600 mg Oral BID  . levothyroxine  75 mcg Oral Q0600  . loratadine  10 mg Oral QPM  . mometasone-formoterol  2 puff Inhalation BID  . montelukast  10 mg Oral QHS  . pantoprazole  40 mg Oral Daily  . polyethylene glycol  17 g Oral Daily  . sodium chloride flush  10-40 mL Intracatheter Q12H  . sodium chloride flush  3 mL Intravenous Q12H    Infusions: . sodium chloride 20 mL/hr at 07/28/20 1205  . sodium chloride    . milrinone 0.375 mcg/kg/min (08/10/20 2234)    PRN Medications: sodium chloride, acetaminophen, alum & mag hydroxide-simeth, lidocaine (PF), melatonin, morphine injection, ondansetron (ZOFRAN) IV, oxyCODONE, sodium chloride, traZODone   Assessment/Plan:   1. Acute on chronic systolic HF -> cardiogenic shock - echo 3/19 EF 50-55% - echo 10/21 EF < 20% RV moderately reduced - echo 11/21 EF < 10-15 % RV moderately reduced mild to moderate MR - developed post MI shock with increasing lactate and AKI - Suspect initial decrease in EF from 3/19 -> 10/21 was nonischemic in nature (?PVCs) but no situation c/b large  anterior infract with LIMA occlusion from cath films.  - Milrinone started 11/21 and stopped 11/24, restarted 11/26 and increased on 12/1 due to low co-ox. Tunnel CVC placed 11/30.  - Co-ox 49% w/ CVP 9. - Continue milrinone 0.375 mcg.  - No ARB/ARNI with AKI.  - No b-blocker yet with shock  - He is not  VAD candidate due to age, fraility and CKD. - Palliative care team following. Goal of care is to be home, and have more time to spend with family and to feel as good as he can for as long as he can. Planning d/c home w/ palliative milrinone.  2. RLE DVT - s/p IVC Filter  - Per IR, no plans for thrombectomy at this point - Continue with eliquis.   3. CAD with acute anterior infarct due to thrombotic LIMA occlusion - Continue ASA, statin (d/c plavix while on eliquis; appears to have been on high statin dose in the past but did not tolerate it).  - no B-blocker with shock - no chest pain.   4. AKI on CKD 3b - baseline creatinine 1.6-1.87. - due to ATN/shock - Creatinine up to 3.4, milrinone started back, now creatinine  2.2.  5. Thombocytopenia -resolved  6. Cough -hx of persistent cough outpt, had PFTs.  Reports improvement in cough.  - CT chest 11/4 showed interstitial pulmonary edema w/ small b/l pleural effusion; interstitial lung disease could not be evaluated; mild paraseptal emphysema - Completed 5 days of doxy for possible bronchitis.  7. Insomnia - continue trazodone  8. Leukocyctosis - Today, a jump in WBC 21.8 but remains afebrile - Completed 5 days of doxy for possible bronchitis on 11/28 during this time period WBC peaked at 18.4.  - ? Related to DVT - Denies facial pain or pressure but feels congested.  - Urinalysis showed large amount of Hgb, mod leukocytes, negative nitrites, protein 100, with many bacteria, yeast and mucus present; WBC 21-50.  -Will start omnicef to cover urine (follow up on culture) for 7 days total.   9.  NSVT  -K 4.7, Mg 2.7   Terance Ice MD 08/11/2020, 7:00 AM  Advanced Heart Failure Team Pager [phone] (M-F; 7a - 4p)  Please contact [facility] for night-coverage after hours (4p -7a ) and weekends on [email or web address]  Patient seen and examined with the above-signed Advanced Practice Provider and/or Housestaff. I personally reviewed laboratory data, imaging studies and relevant notes. I independently examined the patient and formulated the important aspects of the plan. I have edited the note to reflect any of my changes or salient points. I have personally discussed the plan with the patient and/or family.  Continues with persistent cardiogenic shock despite milrinone. Feels weak. Wants to go home  General:  Elderly . No resp difficulty HEENT: normal Neck: supple. JVP 9 Carotids 2+ bilat; no bruits. No lymphadenopathy or thryomegaly appreciated. Cor: PMI nondisplaced. Regular rate & rhythm. +s3 Lungs: clear Abdomen: soft, nontender, nondistended. No hepatosplenomegaly. No bruits or masses. Good bowel sounds. Extremities: no cyanosis, clubbing, rash, 2+ RLE edema Neuro: alert & orientedx3, cranial nerves grossly intact. moves all 4 extremities w/o difficulty. Affect pleasant  He remains in cardiogenic shock. Not candidate for advanced therapies. Plan home with hospice care today. Agree with meds as above. All support in place.   Arvilla Meres, MD  6:39 PM

## 2020-08-11 NOTE — Discharge Summary (Addendum)
Advanced Heart Failure Team  Discharge Summary   Patient ID: Kevin Flores MRN: [id number], DOB/AGE: [birth year] 79 y.o. Admit date: 07/28/2020 D/C date:     08/11/2020   Primary Discharge Diagnoses:  1. Acute on chronic systolic HF -> cardiogenic shock - Continue milrinone 0.375 mcg. - Start lasix 40 mg daily.   - Home with [facility]  2. RLE DVT - Continue with eliquis 10 mg BID then 5 mg BID starting 08/16/2020.   3. CAD with acute anterior infarct due to thrombotic LIMA occlusion - Continue ASA 81 mg daily and atorvastatin 20 mg daily  4. Insomnia - continue trazodone 100 mg PRN  5. Leukocyctosis -Continue omnicef 300 mg daily until 08/17/20 -Follow up on urine culture.    Hospital Course: Kevin Flores presented to Dr. Jenene Slicker office for a follow up appointment for CAD on 07/13/2020 reporting recent cough and shortness of breath.  An echocardiogram was ordered which showed an EF of 20% (previously 50-55%) with mild to moderate MR, he was treated with a diuretic.  He was scheduled for right and left heart catheterization which was completed on 11/17 which showed severe 3 vessel CAD with patent LIM to LAD and SVG to OM (99% 1st diag, 75% Ost LM to mid LM; mid LAD lesion 100% and prox LAD lesion 90%, prox cx to midx CX 100% stenosed, 2nd M1 80%, 2nd M1 100% stenosed, SOst Cx to Prox Cx 90% stenosed with 80% stenosed); PA 47/26 (mean 37), PWCP 23, CO3.5/CI1.76.  There were no targets for PCI and recommended medical therapy with follow up MR.  He was discharged home.  He returned to the ED on 11/18 for abrupt onset of substernal chest pain and weakness with EKG showing anterior STEMI.  He was taken to the cardiac cath for possible PCI which found to have occluded LIMA to LAD and unable to revascularized it.  He was transferred to ICU with troponin > 27K, lactate 2.3 and AKI which he was started on milrinone for cardiogenic shock. He remained on the milrinone, he was given diuretics  per CVP and symptoms. He continued to weakness, and cough.  He was treated for bronchitis for 5 days with doxycycline which did improve the productive cough. On 11/24, milrinone infusion was stopped, unfortunately had to resume the medication on 11/26 due to low cardiac output.  He then developed cramping in his right lower extremity and found to have extensive DVT which he was started on heparin gtt for and IR consulted placed IVC filter on 11/26.  IR re-evaluated patient and felt he was not a candidate for thrombectomy and he was transitioned to eliquis.  He continued to report weakness and fatigue. Palliative care was consulted for goals of care.  Kevin Flores made it clear that his goal was to go home.  On 12/1, CVP increased with significant drop in co-ox.  Co-ox was repeated and remained low.  Milrinone dose increased to 0.375.  Heart rate increased with more episodes of ectopy.  Discussion with Kevin Flores and his family and it was decided to go home on hospice with milrinone. Today, he did experience an increase in WBC (has had leukocytosis during this admission thought it could be contributed to DVT and/or bronchitis).  Now will empirically treat for UTI due to urinalysis results with 1 week of omnicef.  He will be discharged to home hospice on oxygen via nasal cannula, lasix 40 mg daily, milrionone 0.375 mcg/kg/min, eliquis 10 mg BID then 5  mg BID starting 12/7. He will resume home medications with the except for coreg.    1. Acute on chronic systolic HF -> cardiogenic shock - echo 3/19 EF 50-55% - echo 10/21 EF < 20% RV moderately reduced - echo 11/21 EF < 10-15 % RV moderately reduced mild to moderate MR - developed post MI shock with increasing lactate and AKI - Suspect initial decrease in EF from 3/19 -> 10/21 was nonischemic in nature (?PVCs) but now situation c/b large anterior infract with LIMA occlusion from cath films.  - Milrinone started 11/21 and stopped 11/24, restarted 11/26 and increased  on 12/1 due to low co-ox. Tunnel CVC placed 11/30.  - Continue milrinone 0.375 mcg.  - No ARB/ARNI with AKI, b-blocker yet with shock.  - He is not VAD candidate due to 79 age, fraility and CKD. - Palliative care assistance in care is much appreciated, d/c home w/ palliative milrinone.  2. RLE DVT - s/p IVC Filter.  Per IR, no plans for thrombectomy.  - Continue with eliquis.   3. CAD with acute anterior infarct due to thrombotic LIMA occlusion - Continue ASA, statin (d/c plavix while on eliquis; appears to have been on high statin dose in the past but did not tolerate it).  - no B-blocker with shock   4. AKI on CKD 3b - baseline creatinine 1.6-1.87. - due to ATN/shock - Creatinine up to 3.4, milrinone started back, now creatinine  2.2.  5. Thombocytopenia -resolved  6. Cough -hx of persistent cough outpt, had PFTs.  Reports improvement in cough.  - CT chest 11/4 showed interstitial pulmonary edema w/ small b/l pleural effusion; interstitial lung disease could not be evaluated; mild paraseptal emphysema - Completed 5 days of doxy for possible bronchitis.  7. Insomnia - Reports sleeping well last night, related to wife being present.   8. Leukocyctosis - Today, a jump in WBC 21.8 but remains afebrile - Completed 5 days of doxy for possible bronchitis on 11/28 during this time period WBC peaked at 18.4.  - ? Related to DVT - Denies facial pain 77 or pressure but feels congested.  - Urinalysis showed large amount of Hgb, mod leukocytes, negative nitrites, protein 100, with many bacteria, yeast and mucus present; WBC 21-50.  -Will start omnicef to cover urine (follow up on culture) for 7 days total.   9.  NSVT  -K 4.7, Mg 2.7  Discharge Weight Range: 80.4 kg Discharge Vitals: Blood pressure 103/79, pulse (!) 109, temperature 98.5 F (36.9 C), temperature source Axillary, resp. rate 17, height 5\' 9"  (1.753 m), weight 80.4 kg, SpO2 95 %.  Labs: Lab Results  Component Value  Date   WBC 21.8 (H) 08/11/2020   HGB 12.4 (L) 08/11/2020   HCT 38.4 (L) 08/11/2020   MCV 92.3 08/11/2020   PLT 208 08/11/2020    Recent Labs  Lab 08/11/20 0345  NA 137  K 4.7  CL 97*  CO2 25  BUN 60*  CREATININE 2.24*  CALCIUM 9.3  GLUCOSE 119*   Lab Results  Component Value Date   CHOL 98 07/28/2020   HDL 35 (L) 07/28/2020   LDLCALC 51 07/28/2020   TRIG 59 07/28/2020   BNP (last 3 results) Recent Labs    06/09/20 1012 07/29/20 1643  BNP 774.8* 3,099.8*    ProBNP (last 3 results) No results for input(s): PROBNP in the last 8760 hours.   Diagnostic Studies/Procedures   IR Fluoro Guide CV Line Right  Result Date: 08/09/2020 CLINICAL DATA:  Heart failure and need for tunneled central venous catheter for continuous IV Milrinone therapy prior to discharge from the hospital. EXAM: TUNNELED CENTRAL VENOUS HEMODIALYSIS CATHETER PLACEMENT WITH ULTRASOUND AND FLUOROSCOPIC GUIDANCE ANESTHESIA/SEDATION: None MEDICATIONS: None FLUOROSCOPY TIME:  18 seconds.  2.0 mGy. PROCEDURE: The procedure, risks, benefits, and alternatives were explained to the patient. Questions regarding the procedure were encouraged and answered. The patient understands and consents to the procedure. A timeout was performed prior to initiating the procedure. The right neck and chest were prepped with chlorhexidine in a sterile fashion, and a sterile drape was applied covering the operative field. Maximum barrier sterile technique with sterile gowns and gloves were used for the procedure. Local anesthesia was provided with 1% lidocaine. Ultrasound was used to confirm patency of the right internal jugular vein. After creating a small venotomy incision, a 21 gauge needle was advanced into the right internal jugular vein under direct, real-time ultrasound guidance. Ultrasound image documentation was performed. After securing guidewire access, a 6 French peel-away sheath was placed. A wire was kinked to measure  appropriate catheter length. A 6 French, dual-lumen power line tunneled central venous catheter was chosen for placement. This was tunneled in a retrograde fashion from the chest wall to the venotomy incision. The catheter was cut to 24 cm based on guidewire measurement. The catheter was then placed through the peel-away sheath and the sheath removed. Final catheter positioning was confirmed and documented with a fluoroscopic spot image. The catheter was aspirated and flushed with saline. The venotomy incision was closed with subcuticular 4-0 Vicryl. Dermabond was applied to the incision. The catheter exit site was secured with Prolene retention sutures. COMPLICATIONS: None.  No pneumothorax. FINDINGS: After catheter placement, the tip lies at the SVC/RA junction. The catheter aspirates normally and is ready for immediate use. IMPRESSION: Placement of tunneled central venous catheter via the right internal jugular vein. The catheter tip lies at the SVC/RA junction. The catheter is ready for immediate use. Electronically Signed   By: Irish LackGlenn  Yamagata M.D.   On: 08/09/2020 16:48   IR US Guide Vasc Access Right  Result Date: 08/09/2020 CLINICAL DATA:  Heart failure and need for tunneled central venous catheter for continuous IV Milrinone therapy prior to discharge from the hospital. EXAM: TUNNELED CENTRAL VENOUS HEMODIALYSIS CATHETER PLACEMENT WITH ULTRASOUND AND FLUOROSCOPIC GUIDANCE ANESTHESIA/SEDATION: None MEDICATIONS: None FLUOROSCOPY TIME:  18 seconds.  2.0 mGy. PROCEDURE: The procedure, risks, benefits, and alternatives were explained to the patient. Questions regarding the procedure were encouraged and answered. The patient understands and consents to the procedure. A timeout was performed prior to initiating the procedure. The right neck and chest were prepped with chlorhexidine in a sterile fashion, and a sterile drape was applied covering the operative field. Maximum barrier sterile technique with  sterile gowns and gloves were used for the procedure. Local anesthesia was provided with 1% lidocaine. Ultrasound was used to confirm patency of the right internal jugular vein. After creating a small venotomy incision, a 21 gauge needle was advanced into the right internal jugular vein under direct, real-time ultrasound guidance. Ultrasound image documentation was performed. After securing guidewire access, a 6 French peel-away sheath was placed. A wire was kinked to measure appropriate catheter length. A 6 French, dual-lumen power line tunneled central venous catheter was chosen for placement. This was tunneled in a retrograde fashion from the chest wall to the venotomy incision. The catheter was cut to 24 cm based on guidewire measurement. The catheter was then placed through the  peel-away sheath and the sheath removed. Final catheter positioning was confirmed and documented with a fluoroscopic spot image. The catheter was aspirated and flushed with saline. The venotomy incision was closed with subcuticular 4-0 Vicryl. Dermabond was applied to the incision. The catheter exit site was secured with Prolene retention sutures. COMPLICATIONS: None.  No pneumothorax. FINDINGS: After catheter placement, the tip lies at the SVC/RA junction. The catheter aspirates normally and is ready for immediate use. IMPRESSION: Placement of tunneled central venous catheter via the right internal jugular vein. The catheter tip lies at the SVC/RA junction. The catheter is ready for immediate use. Electronically Signed   By: Irish Lack M.D.   On: 08/09/2020 16:48    Discharge Medications   Allergies as of 08/11/2020      Reactions   Imdur [isosorbide Dinitrate] Other (See Comments)   Headache, "made my head swim"   Rosuvastatin Other (See Comments)   SEVERE muscle and leg cramps/aches   Sulfonamide Derivatives Rash, Other (See Comments)   Also "made the whites of my eyes turn yellow"      Medication List    STOP  taking these medications   carvedilol 12.5 MG tablet Commonly known as: COREG     TAKE these medications   allopurinol 300 MG tablet Commonly known as: ZYLOPRIM Take 1 tablet (300 mg total) by mouth daily.   apixaban 5 MG Tabs tablet Commonly known as: ELIQUIS Take 2 tablets (10 mg total) by mouth 2 (two) times daily for 4 days.   apixaban 5 MG Tabs tablet Commonly known as: ELIQUIS Take 1 tablet (5 mg total) by mouth 2 (two) times daily. Start taking on: August 16, 2020   aspirin 81 MG chewable tablet Chew 1 tablet (81 mg total) by mouth daily.   atorvastatin 20 MG tablet Commonly known as: LIPITOR Take 1 tablet (20 mg total) by mouth daily.   budesonide-formoterol 160-4.5 MCG/ACT inhaler Commonly known as: Symbicort Inhale 2 puffs into the lungs in the morning and at bedtime. What changed:   when to take this  reasons to take this   cefdinir 300 MG capsule Commonly known as: OMNICEF Take 1 capsule (300 mg total) by mouth daily for 6 days. Start taking on: August 12, 2020   fish oil-omega-3 fatty acids 1000 MG capsule Take 1 g by mouth at bedtime.   fluticasone 50 MCG/ACT nasal spray Commonly known as: FLONASE Place 1 spray into both nostrils daily.   furosemide 20 MG tablet Commonly known as: LASIX Take 2 tablets (40 mg total) by mouth daily. What changed:   how much to take  when to take this   guaiFENesin 600 MG 12 hr tablet Commonly known as: Mucinex Take 2 tablets (1,200 mg total) by mouth 2 (two) times daily as needed for cough or to loosen phlegm. What changed:   how much to take  when to take this   levocetirizine 5 MG tablet Commonly known as: XYZAL Take 1 tablet (5 mg total) by mouth every evening.   levothyroxine 75 MCG tablet Commonly known as: SYNTHROID TAKE 1 TABLET BY MOUTH EVERY DAY   LUBRICATING EYE DROPS OP Place 1 drop into both eyes daily as needed (dry eyes).   milrinone 20 MG/100 ML Soln infusion Commonly known as:  PRIMACOR Inject 0.0306 mg/min into the vein continuous.   montelukast 10 MG tablet Commonly known as: SINGULAIR TAKE 1 TABLET BY MOUTH EVERYDAY AT BEDTIME What changed: See the new instructions.   pantoprazole 40  MG tablet Commonly known as: PROTONIX TAKE 1 TABLET BY MOUTH EVERY DAY   sodium chloride 0.65 % Soln nasal spray Commonly known as: OCEAN Place 1 spray into both nostrils as needed for congestion.   vitamin C 500 MG tablet Commonly known as: ASCORBIC ACID Take 500 mg by mouth daily.   VITAMIN D-3 PO Take 1 capsule by mouth daily.            Durable Medical Equipment  (From admission, onward)         Start     Ordered   08/10/20 1602  For home use only DME oxygen  Once       Question Answer Comment  Length of Need Lifetime   Mode or (Route) Nasal cannula   Liters per Minute 2   Frequency Continuous (stationary and portable oxygen unit needed)   Oxygen delivery system Gas      08/10/20 1602          Disposition   The patient will be discharged in stable condition to home. Discharge Instructions    Amb Referral to Cardiac Rehabilitation   Complete by: As directed    Diagnosis: STEMI   After initial evaluation and assessments completed: Virtual Based Care may be provided alone or in conjunction with Phase 2 Cardiac Rehab based on patient barriers.: Yes      Follow-up Information    Rollene Rotunda, MD Follow up on 08/18/2020.   Specialty: Cardiology Why: 10:40 AM  Contact information: [address] [place] [address] [phone]        Dontre Laduca, Bevelyn Buckles, MD Follow up on 08/31/2020.   Specialty: Cardiology Why: 10:40 AM  [facility], Entrance C Parking Garage Code 5555 Contact information: [address] [place] [address] [phone]        Bennie Dallas, MD Follow up in 6 week(s).   Specialties: Interventional Radiology, Diagnostic Radiology, Radiology Why: Clinic will call  to schedule follow up appointment. Can also call [phone] with questions or concerns. Contact information: [address] [place] [address] [phone]        [facility] Follow up.   Why: Home Hospice                 Duration of Discharge Encounter: Greater than 35 minutes   Signed, Terance Ice  08/11/2020, 3:38 PM  Patient seen and examined with the above-signed Advanced Practice Provider and/or Housestaff. I personally reviewed laboratory data, imaging studies and relevant notes. I independently examined the patient and formulated the important aspects of the plan. I have edited the note to reflect any of my changes or salient points. I have personally discussed the plan with the patient and/or family.  He remains in cardiogenic shock. Not candidate for advanced therapies. Plan home with hospice care today. Agree with meds as above. All support in place.   Arvilla Meres, MD  6:39 PM

## 2020-08-11 NOTE — Discharge Instructions (Signed)
Information on my medicine - ELIQUIS (apixaban)  This medication education was reviewed with me or my healthcare representative as part of my discharge preparation.  The pharmacist that spoke with me during my hospital stay was:  Mosetta Anis, [facility]  Why was Eliquis prescribed for you? Eliquis was prescribed to treat blood clots that may have been found in the veins of your legs (deep vein thrombosis) or in your lungs (pulmonary embolism) and to reduce the risk of them occurring again.  What do You need to know about Eliquis ? The starting dose is 10 mg (two 5 mg tablets) taken TWICE daily for the FIRST SEVEN (7) DAYS, then on (enter date)  08/16/20 pm  the dose is reduced to ONE 5 mg tablet taken TWICE daily.  Eliquis may be taken with or without food.   Try to take the dose about the same time in the morning and in the evening. If you have difficulty swallowing the tablet whole please discuss with your pharmacist how to take the medication safely.  Take Eliquis exactly as prescribed and DO NOT stop taking Eliquis without talking to the doctor who prescribed the medication.  Stopping may increase your risk of developing a new blood clot.  Refill your prescription before you run out.  After discharge, you should have regular check-up appointments with your healthcare provider that is prescribing your Eliquis.    What do you do if you miss a dose? If a dose of ELIQUIS is not taken at the scheduled time, take it as soon as possible on the same day and twice-daily administration should be resumed. The dose should not be doubled to make up for a missed dose.  Important Safety Information A possible side effect of Eliquis is bleeding. You should call your healthcare provider right away if you experience any of the following: ? Bleeding from an injury or your nose that does not stop. ? Unusual colored urine (red or dark brown) or unusual colored stools (red or black). ? Unusual bruising  for unknown reasons. ? A serious fall or if you hit your head (even if there is no bleeding).  Some medicines may interact with Eliquis and might increase your risk of bleeding or clotting while on Eliquis. To help avoid this, consult your healthcare provider or pharmacist prior to using any new prescription or non-prescription medications, including herbals, vitamins, non-steroidal anti-inflammatory drugs (NSAIDs) and supplements.  This website has more information on Eliquis (apixaban): http://www.eliquis.com/eliquis/home

## 2020-08-11 NOTE — TOC Transition Note (Addendum)
Transition of Care [facility]) - CM/SW Discharge Note   Patient Details  Name: Kevin Flores MRN: [id number] Date of Birth: 18-Feb-1941  [facility]) CM/SW Contact:  Leone Haven, RN Phone Number: 08/11/2020, 1:19 PM   Clinical Narrative:    Patient is for dc today, Jeri Modena will come by at 3 pm to hook up patient and Ian Malkin will bring oxygen tank to patient room so he can go home with it .  He will be going home via car.   14:21- Per Jeri Modena patient is hookedup and ready for DC, just  Need DC orders. NCM will notify MD.   Final next level of care: Home w Hospice Care Barriers to Discharge: No Barriers Identified   Patient Goals and CMS Choice Patient states their goals for this hospitalization and ongoing recovery are:: go home and get better CMS Medicare.gov Compare Post Acute Care list provided to:: Patient Choice offered to / list presented to : Patient  Discharge Placement                       Discharge Plan and Services                DME Arranged: Oxygen DME Agency: [employer] Date DME Agency Contacted: 08/11/20 Time DME Agency Contacted: 1318 Representative spoke with at DME Agency: Ian Malkin HH Arranged: RN HH Agency:  [facility]) Date [facility] Agency Contacted: 08/11/20 Time HH Agency Contacted: 1318 Representative spoke with at [facility] Agency: Tim  Social Determinants of Health (SDOH) Interventions     Readmission Risk Interventions No flowsheet data found.

## 2020-08-11 NOTE — Consult Note (Signed)
   [facility] Inpatient Consult   08/11/2020  Kevin Flores July 20, 1941 [id number]   [place] [facility] [ACO] Patient: Medicare NextGen    Patient screened for length of stay [14d] hospitalization to check for potential [place] [employer] service needs.  Review of patient's medical record reveals patient is for home with [facility] per inpatient [facility] RN notes.  Plan: Post hospital needs to be met at that level of care.  Will sign off at transition. For questions contact:   Natividad Brood, RN BSN [facility] Liaison  [phone] business mobile phone Toll free office [phone]  Fax number: [phone] [name]

## 2020-08-11 NOTE — TOC Progression Note (Signed)
Transition of Care [facility]) - Progression Note    Patient Details  Name: Kevin Flores MRN: [id number] Date of Birth: [birth year]  Transition of Care [facility]) CM/SW Contact  Leone Haven, RN Phone Number: 08/11/2020, 11:48 AM  Clinical Narrative:    NCM spoke with Jamal Maes with [facility], he has spoken with [facility] and they will deliver oxygen between 5 and 6.  NCM informed Tim that it will be better for him to go home by car at that time so somone at the home can bring a tank here to hospital to transport him home. Because ptar has been backed up and to set up ptar that late , he may not get home til really late in the night.Tim states he is getting ready to contact wife now.    Barriers to Discharge: Continued Medical Work up  Expected Discharge Plan and Services                             DME Agency: NA       HH Arranged: RN HH Agency: [facility] Date HH Agency Contacted: 08/08/20 Time HH Agency Contacted: 1259 Representative spoke with at [facility] Agency: Pearson Grippe   Social Determinants of Health (SDOH) Interventions    Readmission Risk Interventions No flowsheet data found.

## 2020-08-14 LAB — URINE CULTURE: Culture: >=100000 — AB

## 2020-08-16 ENCOUNTER — Ambulatory Visit: Payer: Medicare Other | Admitting: Pulmonary Disease

## 2020-08-18 ENCOUNTER — Ambulatory Visit: Payer: Medicare Other | Admitting: Cardiology

## 2020-08-31 ENCOUNTER — Encounter (HOSPITAL_COMMUNITY): Payer: Medicare Other | Admitting: Internal Medicine

## 2020-09-10 DEATH — deceased

## 2020-09-11 ENCOUNTER — Other Ambulatory Visit: Payer: Self-pay | Admitting: Family

## 2020-09-13 ENCOUNTER — Ambulatory Visit: Payer: Medicare Other | Admitting: Cardiology

## 2020-11-02 ENCOUNTER — Ambulatory Visit: Payer: Medicare Other | Admitting: Urology

## 2021-09-13 IMAGING — CR DG CHEST 2V
3 series · 3 of 3 positions shown · non-contrast
Comparison: 07/29/2020

CLINICAL DATA: Continued shortness of breath and cough

EXAM:
CHEST - 2 VIEW

[chest lat (1 of 2)]
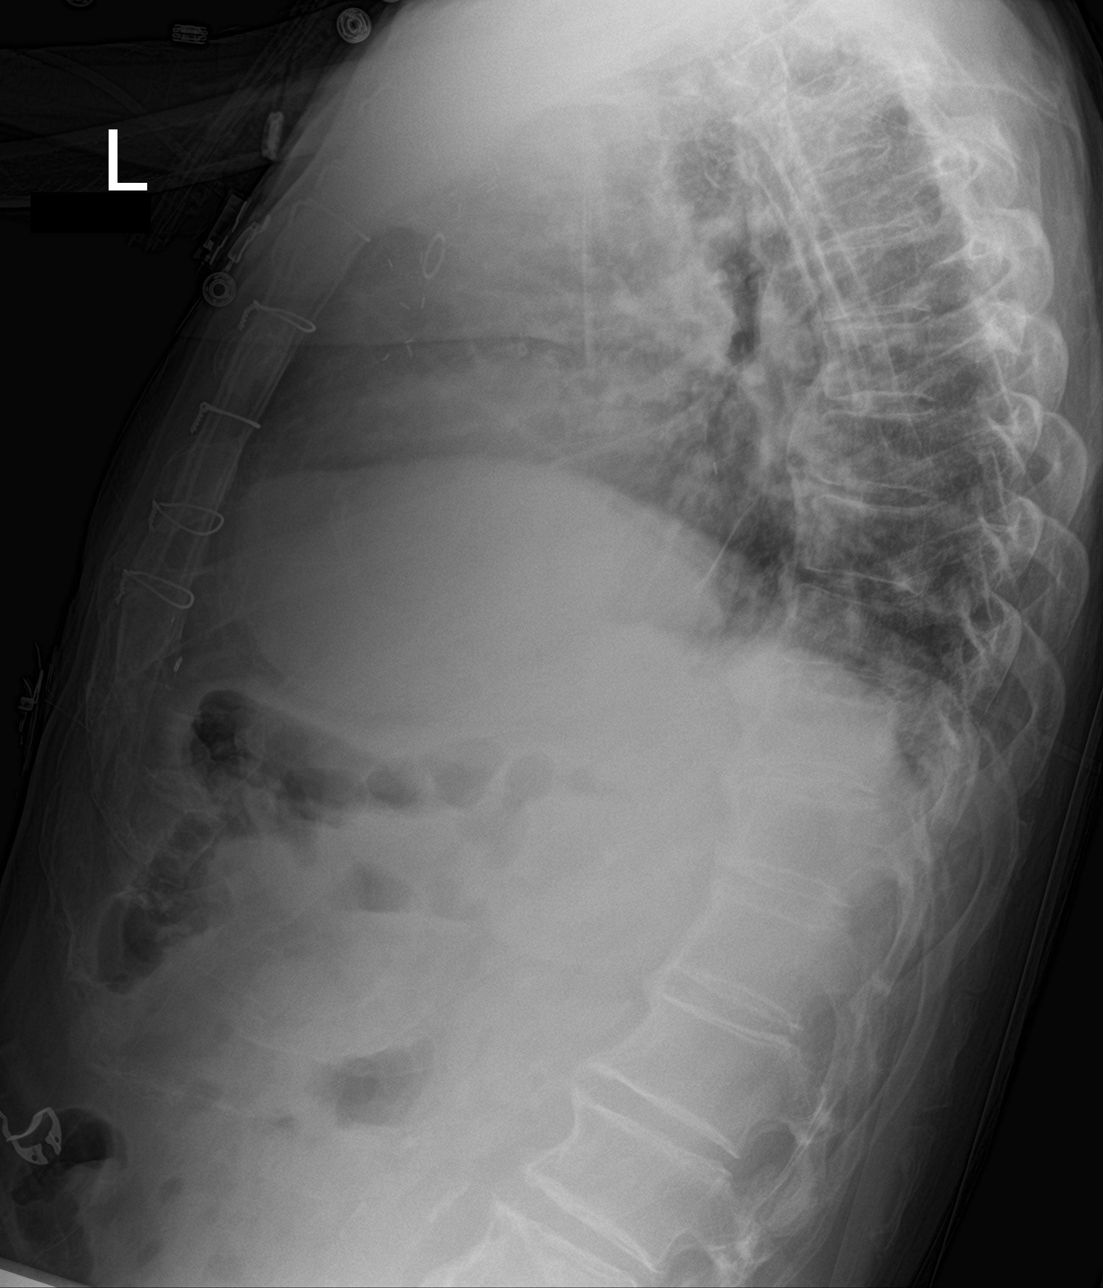

[chest ap]
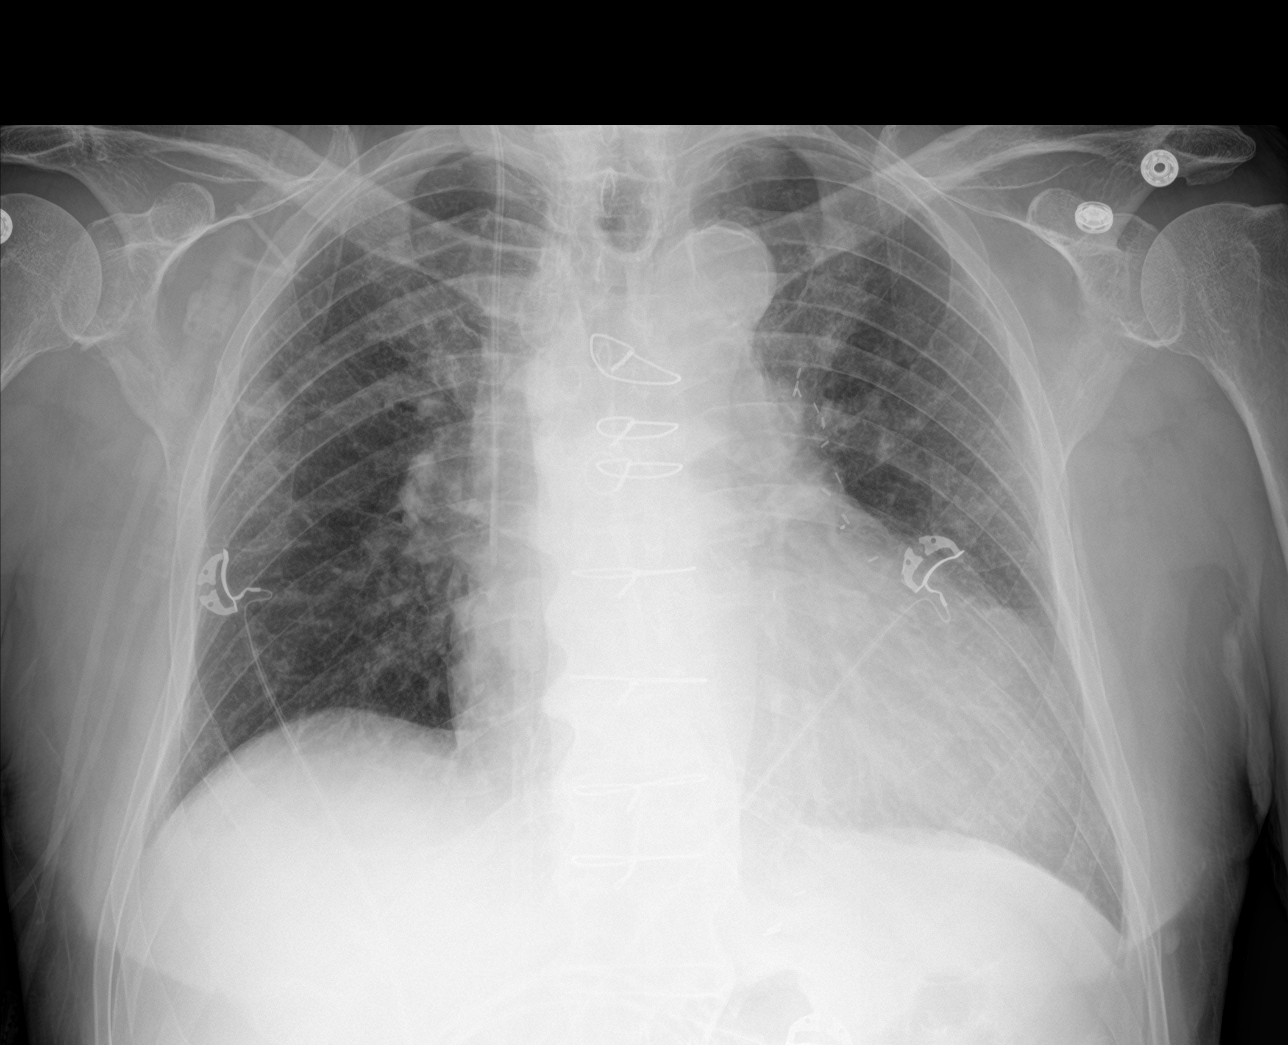

[chest lat (2 of 2)]
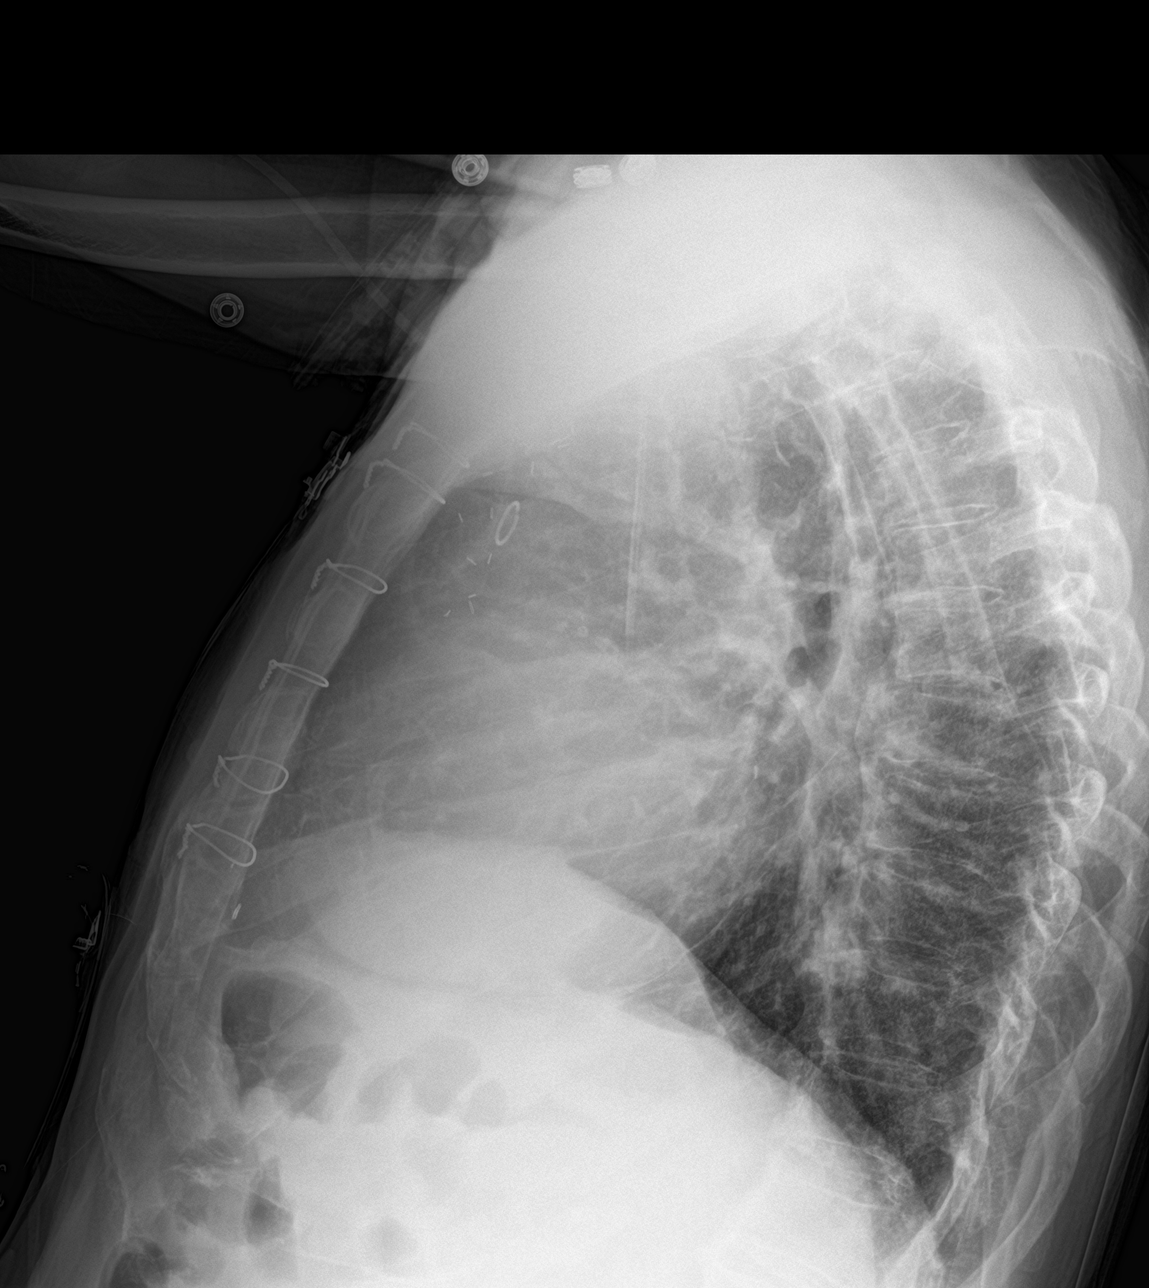

[3 of 3 positions shown; findings below may reference images not displayed]

FINDINGS: Cardiac shadow remains enlarged. Postsurgical changes are again
seen. Aortic calcifications are noted. Right jugular central line is
again seen and stable. Lungs are well aerated bilaterally. No focal
infiltrate or sizable effusion is seen. Mild fibrotic changes are
again seen and stable
IMPRESSION: No acute abnormality noted.

## 2021-09-19 IMAGING — XA IR FLUORO GUIDE CV LINE*R*
1 series · 3 of 3 positions shown · non-contrast
Comparison: none

CLINICAL DATA: Heart failure and need for tunneled central venous
catheter for continuous IV Milrinone therapy prior to discharge from
the hospital.

[Series 1: fl (-) angio · 3 of 3 slices shown]
[im 1/3]
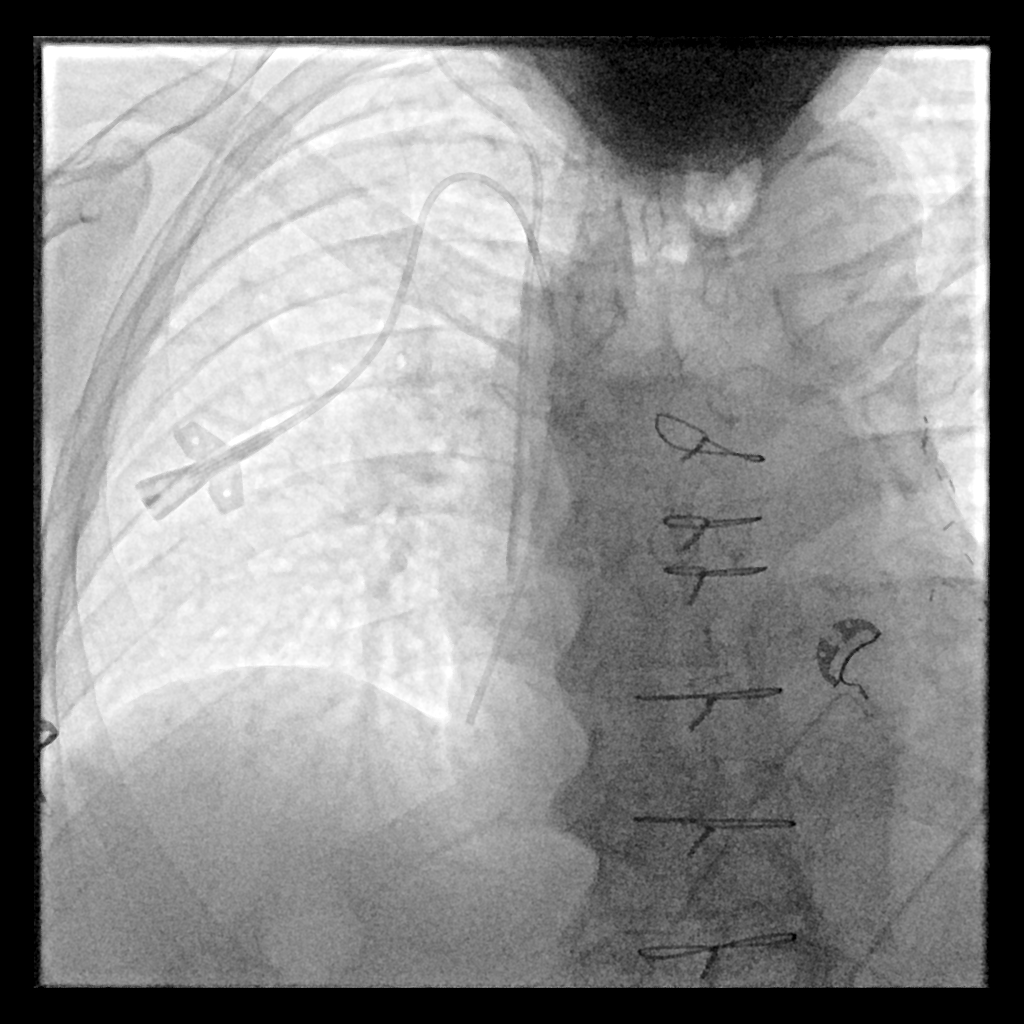
[im 2/3]
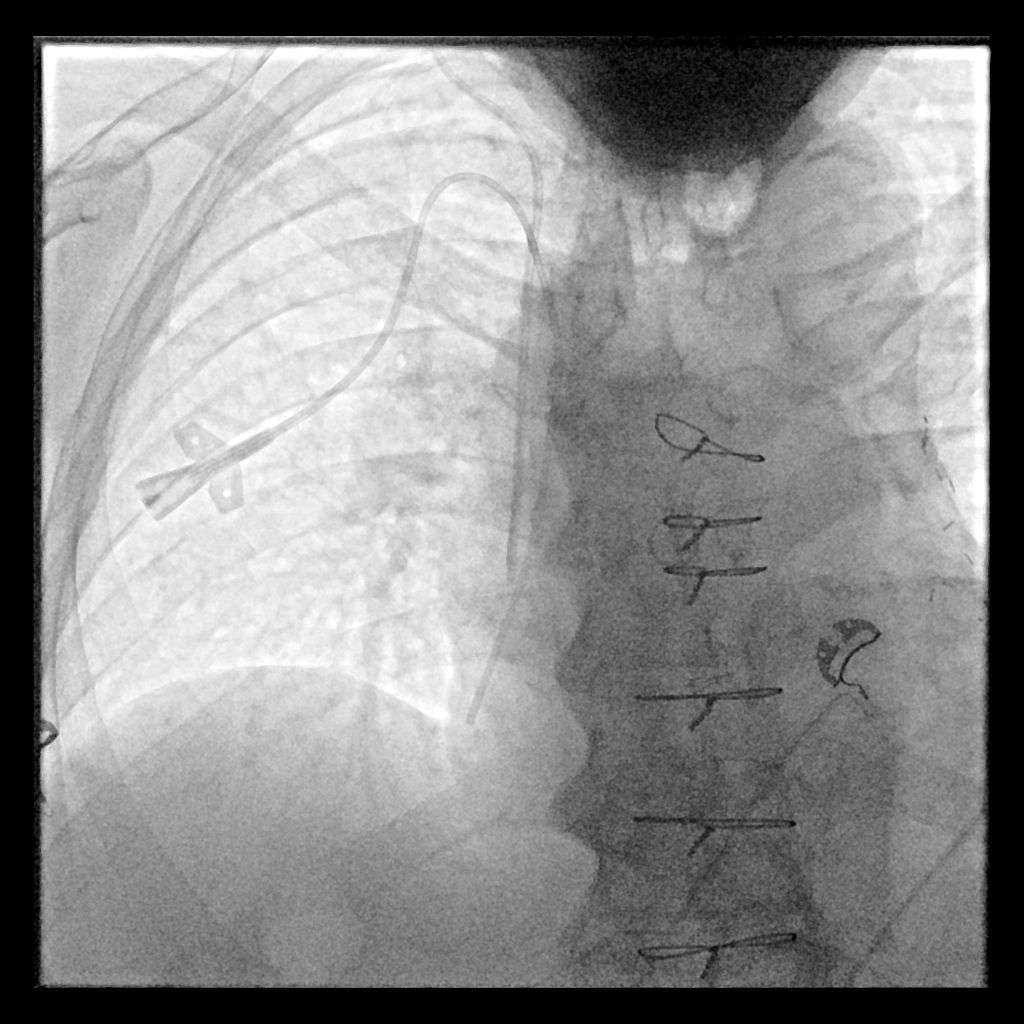
[im 3/3]
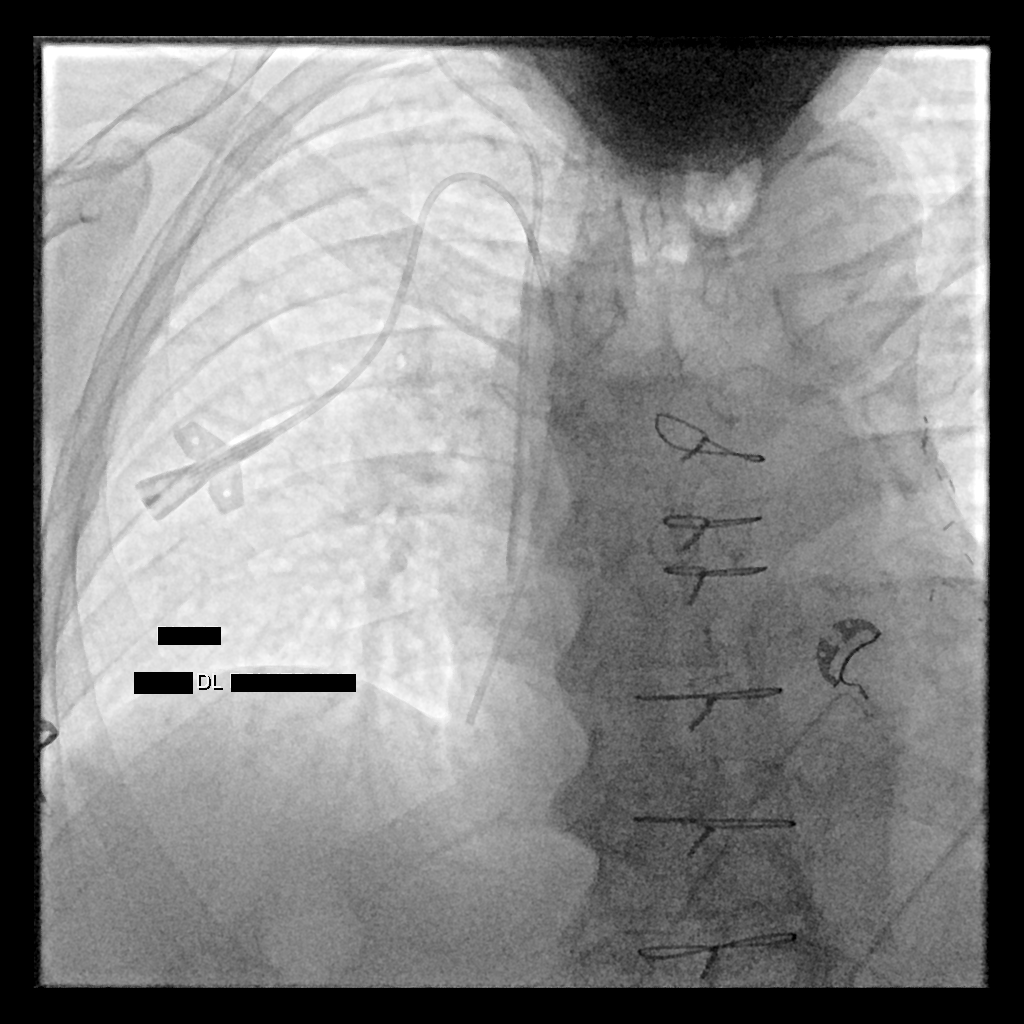

[3 of 3 positions shown; findings below may reference images not displayed]

EXAM:
TUNNELED CENTRAL VENOUS HEMODIALYSIS CATHETER PLACEMENT WITH
ULTRASOUND AND FLUOROSCOPIC GUIDANCE

ANESTHESIA/SEDATION:
None

MEDICATIONS:
None

FLUOROSCOPY TIME:  18 seconds.  2.0 mGy.

PROCEDURE:
The procedure, risks, benefits, and alternatives were explained to
the patient. Questions regarding the procedure were encouraged and
answered. The patient understands and consents to the procedure. A
timeout was performed prior to initiating the procedure.

The right neck and chest were prepped with chlorhexidine in a
sterile fashion, and a sterile drape was applied covering the
operative field. Maximum barrier sterile technique with sterile
gowns and gloves were used for the procedure. Local anesthesia was
provided with 1% lidocaine.

Ultrasound was used to confirm patency of the right internal jugular
vein. After creating a small venotomy incision, a 21 gauge needle
was advanced into the right internal jugular vein under direct,
real-time ultrasound guidance. Ultrasound image documentation was
performed. After securing guidewire access, a 6 French peel-away
sheath was placed. A wire was kinked to measure appropriate catheter
length.

A 6 French, dual-lumen power line tunneled central venous catheter
was chosen for placement. This was tunneled in a retrograde fashion
from the chest wall to the venotomy incision. The catheter was cut
to 24 cm based on guidewire measurement.

The catheter was then placed through the peel-away sheath and the
sheath removed. Final catheter positioning was confirmed and
documented with a fluoroscopic spot image. The catheter was
aspirated and flushed with saline.

The venotomy incision was closed with subcuticular 4-0 Vicryl.
Dermabond was applied to the incision. The catheter exit site was
secured with Prolene retention sutures.

COMPLICATIONS:
None.  No pneumothorax.
FINDINGS: After catheter placement, the tip lies at the SVC/RA junction. The
catheter aspirates normally and is ready for immediate use.
IMPRESSION: Placement of tunneled central venous catheter via the right internal
jugular vein. The catheter tip lies at the SVC/RA junction. The
catheter is ready for immediate use.
